# Patient Record
Sex: Female | Born: 1995 | Race: White | Hispanic: No | Marital: Married | Smoking: Former smoker
Health system: Southern US, Community
[De-identification: ages and names within clinical notes are randomized; demographics above are authoritative.]

## PROBLEM LIST (undated history)

## (undated) ENCOUNTER — Ambulatory Visit: Admission: EM

## (undated) DIAGNOSIS — Z9889 Other specified postprocedural states: Secondary | ICD-10-CM

## (undated) DIAGNOSIS — N809 Endometriosis, unspecified: Secondary | ICD-10-CM

## (undated) DIAGNOSIS — R519 Headache, unspecified: Secondary | ICD-10-CM

## (undated) DIAGNOSIS — D649 Anemia, unspecified: Secondary | ICD-10-CM

## (undated) DIAGNOSIS — N3000 Acute cystitis without hematuria: Secondary | ICD-10-CM

## (undated) DIAGNOSIS — F32A Depression, unspecified: Secondary | ICD-10-CM

## (undated) DIAGNOSIS — G90A Postural orthostatic tachycardia syndrome (POTS): Secondary | ICD-10-CM

## (undated) DIAGNOSIS — I499 Cardiac arrhythmia, unspecified: Secondary | ICD-10-CM

## (undated) DIAGNOSIS — K219 Gastro-esophageal reflux disease without esophagitis: Secondary | ICD-10-CM

## (undated) DIAGNOSIS — E559 Vitamin D deficiency, unspecified: Secondary | ICD-10-CM

## (undated) DIAGNOSIS — R51 Headache: Secondary | ICD-10-CM

## (undated) DIAGNOSIS — R112 Nausea with vomiting, unspecified: Secondary | ICD-10-CM

## (undated) DIAGNOSIS — F419 Anxiety disorder, unspecified: Secondary | ICD-10-CM

## (undated) HISTORY — PX: ADENOIDECTOMY: SHX5191

## (undated) HISTORY — PX: TONSILLECTOMY: SUR1361

## (undated) HISTORY — DX: Anxiety disorder, unspecified: F41.9

## (undated) HISTORY — PX: WISDOM TOOTH EXTRACTION: SHX21

## (undated) HISTORY — PX: CHOLECYSTECTOMY: SHX55

## (undated) HISTORY — PX: TONSILLECTOMY AND ADENOIDECTOMY: SHX28

---
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
## 2013-01-23 ENCOUNTER — Emergency Department: Payer: Self-pay | Admitting: Emergency Medicine

## 2013-01-23 LAB — CBC
HGB: 13.7 g/dL (ref 12.0–16.0)
MCHC: 33.7 g/dL (ref 32.0–36.0)
MCV: 85 fL (ref 80–100)
Platelet: 157 10*3/uL (ref 150–440)
RBC: 4.78 10*6/uL (ref 3.80–5.20)
RDW: 13.3 % (ref 11.5–14.5)

## 2013-01-23 LAB — COMPREHENSIVE METABOLIC PANEL
Bilirubin,Total: 0.5 mg/dL (ref 0.2–1.0)
Chloride: 111 mmol/L — ABNORMAL HIGH (ref 97–107)
Co2: 23 mmol/L (ref 16–25)
EGFR (African American): >60
EGFR (Non-African Amer.): >60
Osmolality: 278 (ref 275–301)
Potassium: 3.9 mmol/L (ref 3.3–4.7)
SGOT(AST): 17 U/L (ref 0–26)

## 2013-01-23 LAB — URINALYSIS, COMPLETE
Bacteria: NONE SEEN
Glucose,UR: NEGATIVE mg/dL (ref 0–75)
Ketone: NEGATIVE
Protein: NEGATIVE
RBC,UR: 1 /HPF (ref 0–5)
Specific Gravity: 1.023 (ref 1.003–1.030)
Squamous Epithelial: 6

## 2013-01-23 LAB — GC/CHLAMYDIA PROBE AMP

## 2013-11-08 ENCOUNTER — Emergency Department: Payer: Self-pay | Admitting: Emergency Medicine

## 2013-11-08 LAB — CBC
HCT: 40.4 % (ref 35.0–47.0)
HGB: 12.7 g/dL (ref 12.0–16.0)
MCH: 27.6 pg (ref 26.0–34.0)
MCHC: 31.5 g/dL — ABNORMAL LOW (ref 32.0–36.0)
MCV: 88 fL (ref 80–100)
Platelet: 135 10*3/uL — ABNORMAL LOW (ref 150–440)
RBC: 4.62 10*6/uL (ref 3.80–5.20)
RDW: 14 % (ref 11.5–14.5)
WBC: 6.7 10*3/uL (ref 3.6–11.0)

## 2013-11-08 LAB — URINALYSIS, COMPLETE
Bilirubin,UR: NEGATIVE
Blood: NEGATIVE
GLUCOSE, UR: NEGATIVE mg/dL (ref 0–75)
Ketone: NEGATIVE
Leukocyte Esterase: NEGATIVE
Nitrite: NEGATIVE
PH: 5 (ref 4.5–8.0)
RBC,UR: NONE SEEN /HPF (ref 0–5)
Specific Gravity: 1.028 (ref 1.003–1.030)
Squamous Epithelial: 22

## 2013-11-08 LAB — BASIC METABOLIC PANEL
Anion Gap: 7 (ref 7–16)
BUN: 13 mg/dL (ref 7–18)
CALCIUM: 8.6 mg/dL — AB (ref 9.0–10.7)
CO2: 24 mmol/L (ref 21–32)
CREATININE: 0.77 mg/dL (ref 0.60–1.30)
Chloride: 110 mmol/L — ABNORMAL HIGH (ref 98–107)
EGFR (African American): >60
Glucose: 84 mg/dL (ref 65–99)
OSMOLALITY: 281 (ref 275–301)
Potassium: 4.1 mmol/L (ref 3.5–5.1)
SODIUM: 141 mmol/L (ref 136–145)

## 2014-02-09 ENCOUNTER — Emergency Department: Payer: Self-pay | Admitting: Emergency Medicine

## 2014-02-09 LAB — CBC
HCT: 41.2 % (ref 35.0–47.0)
HGB: 12.9 g/dL (ref 12.0–16.0)
MCH: 28.2 pg (ref 26.0–34.0)
MCHC: 31.4 g/dL — ABNORMAL LOW (ref 32.0–36.0)
MCV: 90 fL (ref 80–100)
Platelet: 149 10*3/uL — ABNORMAL LOW (ref 150–440)
RBC: 4.59 10*6/uL (ref 3.80–5.20)
RDW: 13.6 % (ref 11.5–14.5)
WBC: 13.2 10*3/uL — AB (ref 3.6–11.0)

## 2014-02-10 LAB — HCG, QUANTITATIVE, PREGNANCY: Beta Hcg, Quant.: 63277 m[IU]/mL — ABNORMAL HIGH

## 2014-02-10 LAB — URINALYSIS, COMPLETE
BILIRUBIN, UR: NEGATIVE
Glucose,UR: NEGATIVE mg/dL (ref 0–75)
Ketone: NEGATIVE
Leukocyte Esterase: NEGATIVE
NITRITE: NEGATIVE
Ph: 6 (ref 4.5–8.0)
Protein: NEGATIVE
RBC,UR: 7 /HPF (ref 0–5)
Specific Gravity: 1.016 (ref 1.003–1.030)
Squamous Epithelial: 2
WBC UR: 1 /HPF (ref 0–5)

## 2014-05-02 ENCOUNTER — Ambulatory Visit: Payer: Self-pay | Admitting: Obstetrics & Gynecology

## 2014-05-08 ENCOUNTER — Emergency Department: Payer: Self-pay | Admitting: Emergency Medicine

## 2014-05-08 LAB — CBC WITH DIFFERENTIAL/PLATELET
Basophil #: 0.1 10*3/uL (ref 0.0–0.1)
Basophil %: 0.4 %
EOS ABS: 0.1 10*3/uL (ref 0.0–0.7)
Eosinophil %: 0.7 %
HCT: 37.8 % (ref 35.0–47.0)
HGB: 12 g/dL (ref 12.0–16.0)
Lymphocyte #: 2.1 10*3/uL (ref 1.0–3.6)
Lymphocyte %: 12.1 %
MCH: 28.9 pg (ref 26.0–34.0)
MCHC: 31.9 g/dL — ABNORMAL LOW (ref 32.0–36.0)
MCV: 91 fL (ref 80–100)
MONO ABS: 1.4 x10 3/mm — AB (ref 0.2–0.9)
Monocyte %: 8.4 %
NEUTROS ABS: 13.3 10*3/uL — AB (ref 1.4–6.5)
Neutrophil %: 78.4 %
Platelet: 157 10*3/uL (ref 150–440)
RBC: 4.16 10*6/uL (ref 3.80–5.20)
RDW: 14 % (ref 11.5–14.5)
WBC: 17 10*3/uL — AB (ref 3.6–11.0)

## 2014-05-08 LAB — URINALYSIS, COMPLETE
BILIRUBIN, UR: NEGATIVE
Blood: NEGATIVE
Glucose,UR: NEGATIVE mg/dL (ref 0–75)
KETONE: NEGATIVE
LEUKOCYTE ESTERASE: NEGATIVE
Nitrite: NEGATIVE
Ph: 6 (ref 4.5–8.0)
Protein: NEGATIVE
Specific Gravity: 1.004 (ref 1.003–1.030)
Squamous Epithelial: 8

## 2014-05-08 LAB — COMPREHENSIVE METABOLIC PANEL
ALT: 21 U/L
ANION GAP: 7 (ref 7–16)
AST: 16 U/L (ref 15–37)
Albumin: 3 g/dL — ABNORMAL LOW (ref 3.4–5.0)
Alkaline Phosphatase: 53 U/L
BUN: 8 mg/dL (ref 7–18)
Bilirubin,Total: 0.2 mg/dL (ref 0.2–1.0)
CO2: 23 mmol/L (ref 21–32)
Calcium, Total: 8.4 mg/dL — ABNORMAL LOW (ref 8.5–10.1)
Chloride: 108 mmol/L — ABNORMAL HIGH (ref 98–107)
Creatinine: 0.56 mg/dL — ABNORMAL LOW (ref 0.60–1.30)
EGFR (African American): >60
GLUCOSE: 77 mg/dL (ref 65–99)
Osmolality: 273 (ref 275–301)
Potassium: 3.6 mmol/L (ref 3.5–5.1)
Sodium: 138 mmol/L (ref 136–145)
Total Protein: 6.6 g/dL (ref 6.4–8.2)

## 2014-05-08 LAB — GC/CHLAMYDIA PROBE AMP

## 2014-05-08 LAB — WET PREP, GENITAL

## 2014-05-08 LAB — HCG, QUANTITATIVE, PREGNANCY: Beta Hcg, Quant.: 5992 m[IU]/mL — ABNORMAL HIGH

## 2014-08-21 ENCOUNTER — Observation Stay: Payer: Self-pay | Admitting: Obstetrics & Gynecology

## 2014-09-04 ENCOUNTER — Observation Stay: Payer: Self-pay

## 2014-09-04 LAB — URINALYSIS, COMPLETE
Bilirubin,UR: NEGATIVE
Blood: NEGATIVE
Glucose,UR: NEGATIVE mg/dL (ref 0–75)
KETONE: NEGATIVE
Nitrite: NEGATIVE
PH: 6 (ref 4.5–8.0)
Protein: NEGATIVE
RBC,UR: 3 /HPF (ref 0–5)
SPECIFIC GRAVITY: 1.009 (ref 1.003–1.030)
Squamous Epithelial: 3

## 2014-09-04 LAB — DRUG SCREEN, URINE
Amphetamines, Ur Screen: NEGATIVE
BENZODIAZEPINE, UR SCRN: NEGATIVE
Barbiturates, Ur Screen: NEGATIVE
CANNABINOID 50 NG, UR ~~LOC~~: NEGATIVE
Cocaine Metabolite,Ur ~~LOC~~: NEGATIVE
MDMA (Ecstasy)Ur Screen: NEGATIVE
Methadone, Ur Screen: NEGATIVE
Opiate, Ur Screen: NEGATIVE
Phencyclidine (PCP) Ur S: NEGATIVE
Tricyclic, Ur Screen: NEGATIVE

## 2014-09-25 ENCOUNTER — Observation Stay
Admit: 2014-09-25 | Disposition: A | Discharge status: Home / Self Care | Payer: Self-pay | Attending: Certified Nurse Midwife | Admitting: Certified Nurse Midwife

## 2014-09-25 ENCOUNTER — Inpatient Hospital Stay
Admit: 2014-09-25 | Disposition: A | Discharge status: Home / Self Care | Payer: Self-pay | Attending: Certified Nurse Midwife | Admitting: Certified Nurse Midwife

## 2014-09-25 LAB — DRUG SCREEN, URINE
AMPHETAMINES, UR SCREEN: NEGATIVE
BENZODIAZEPINE, UR SCRN: NEGATIVE
Barbiturates, Ur Screen: NEGATIVE
CANNABINOID 50 NG, UR ~~LOC~~: NEGATIVE
Cocaine Metabolite,Ur ~~LOC~~: NEGATIVE
MDMA (Ecstasy)Ur Screen: NEGATIVE
METHADONE, UR SCREEN: NEGATIVE
Opiate, Ur Screen: POSITIVE
PHENCYCLIDINE (PCP) UR S: NEGATIVE
Tricyclic, Ur Screen: NEGATIVE

## 2014-09-25 LAB — CBC WITH DIFFERENTIAL/PLATELET
Basophil #: 0 10*3/uL (ref 0.0–0.1)
Basophil %: 0.2 %
EOS ABS: 0.1 10*3/uL (ref 0.0–0.7)
Eosinophil %: 0.5 %
HCT: 38.5 % (ref 35.0–47.0)
HGB: 12.2 g/dL (ref 12.0–16.0)
LYMPHS ABS: 1.7 10*3/uL (ref 1.0–3.6)
Lymphocyte %: 9.4 %
MCH: 27.7 pg (ref 26.0–34.0)
MCHC: 31.6 g/dL — AB (ref 32.0–36.0)
MCV: 88 fL (ref 80–100)
Monocyte #: 1.4 x10 3/mm — ABNORMAL HIGH (ref 0.2–0.9)
Monocyte %: 7.9 %
NEUTROS PCT: 82 %
Neutrophil #: 14.4 10*3/uL — ABNORMAL HIGH (ref 1.4–6.5)
PLATELETS: 143 10*3/uL — AB (ref 150–440)
RBC: 4.4 10*6/uL (ref 3.80–5.20)
RDW: 16.3 % — ABNORMAL HIGH (ref 11.5–14.5)
WBC: 17.6 10*3/uL — ABNORMAL HIGH (ref 3.6–11.0)

## 2014-09-27 LAB — HEMATOCRIT: HCT: 32.2 % — ABNORMAL LOW (ref 35.0–47.0)

## 2014-09-27 LAB — GC/CHLAMYDIA PROBE AMP

## 2014-10-17 NOTE — H&P (Signed)
L&D Evaluation:  History:  HPI Pt is a 19 yo G1 at 38.[redacted] weeks GA with an EDC of 10/05/14 who presents to L&D with reports of contractions that started around 4am. She also reports that while she was taking a bath she felt like some fluid gushed out. Her prenatal course has been complicated by a UTI x2 with the last one being 4/14, bleeding in early pregnancy with Rhogam given, and MJ use with the last screen on 3/22 being negative. 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 She is B-, VI, RI, GBS negative, and UTD on her Tdap.   Presents with contractions   Patient's Medical History UTI   Patient's Surgical History T&A, wisdom teeth extraction   Allergies PCN   Social History tobacco  drugs  MJ use   Family History Non-Contributory   ROS:  ROS All systems were reviewed.  HEENT, CNS, GI, GU, Respiratory, CV, Renal and Musculoskeletal systems were found to be normal.   Exam:  Vital Signs stable   General appears uncomfortable   Mental Status clear   Abdomen gravid, tender with contractions   Pelvic 3.5/85-90/0   Mebranes Intact, negative nitrazine   FHT normal rate with no decels   Ucx irregular   Skin dry, no lesions, no rashes   Lymph no lymphadenopathy   Impression:  Impression reactive NST, IUP at 38.4, r/o labor   Plan:  Plan monitor contractions and for cervical change, ambulate, will re-assess cervix after 1 hour of walking.   Follow Up Appointment need to schedule   Electronic Signatures: Jannet MantisSubudhi, Abeeha Twist (CNM)  (Signed 18-Apr-16 09:14)  Authored: L&D Evaluation   Last Updated: 18-Apr-16 09:14 by Jannet MantisSubudhi, Garik Diamant (CNM)

## 2014-10-17 NOTE — H&P (Signed)
L&D Evaluation:  History:  HPI Pt is a 19 yo G1 at 38.[redacted] weeks GA with an EDC of 10/05/14 who presents to L&D with reports of contractions that started around 4am. She was seen earlier today on L&D and made no cervical change so she was discharged to be able to labor at home in the early stages. Her prenatal course has been complicated by a UTI x2 with the last one being 4/14, bleeding in early pregnancy with Rhogam given, and MJ use with the last screen on 3/22 being negative. 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 She is B-, VI, RI, GBS negative, and UTD on her Tdap.   Presents with contractions   Patient's Medical History UTI   Patient's Surgical History T&A, wisdom teeth extraction   Allergies PCN   Social History tobacco  drugs  MJ use   Family History Non-Contributory   ROS:  ROS All systems were reviewed.  HEENT, CNS, GI, GU, Respiratory, CV, Renal and Musculoskeletal systems were found to be normal.   Exam:  Vital Signs stable   General appears uncomfortable, breathing through contrations   Mental Status clear   Abdomen gravid, tender with contractions   Pelvic 4/90/+1   Mebranes Intact   FHT normal rate with no decels   Ucx irregular   Skin dry, no lesions, no rashes   Lymph no lymphadenopathy   Impression:  Impression reactive NST, IUP at 38.4, early labor   Plan:  Plan monitor contractions and for cervical change, will give morphine and phenergan IM for therapeutic rest.  Dispo pending cervical exam after therapeutic rest.   Follow Up Appointment need to schedule   Electronic Signatures: Jannet MantisSubudhi, Naasia Weilbacher (CNM)  (Signed 18-Apr-16 15:08)  Authored: L&D Evaluation   Last Updated: 18-Apr-16 15:08 by Jannet MantisSubudhi, Marleena Shubert (CNM)

## 2014-10-17 NOTE — H&P (Signed)
L&D Evaluation:  History Expanded:  HPI 19 yo G1 with EDD of 10/05/14 per 6 wk US, presents at 8546w4d with c/o painful, regular contractions. No VB, no LOF, +FM[facility], MJ use with negative UDS on 3/22. Labs: B neg/RI/VI.   Blood Type (Maternal) B negative   Group B Strep Results Maternal (Result >5wks must be treated as unknown) unknown/result > 5 weeks ago   Maternal HIV Negative   Maternal Syphilis Ab Nonreactive   Maternal Varicella Immune   Rubella Results (Maternal) immune   Presents with contractions   Patient's Medical History No Chronic Illness   Patient's Surgical History T&A, wisdom teeth   Allergies PCN, amoxicillin   Social History h/o marijuana and tobacco   Exam:  Vital Signs stable   General no apparent distress   Mental Status clear   Abdomen gravid, non-tender   Pelvic no external lesions, 1/80/-1, posterior   Mebranes Intact   FHT normal rate with no decels, category 1 tracing, baseline 130, mod variability, + accels, no decels   Ucx irregular   Impression:  Impression IUP at 35wks, discomforts of pregnancy, no evidence of labor   Plan:  Plan discharge   Comments UA pending, then d/c home   Electronic Signatures: Vella KohlerBrothers, Tymothy Cass K (CNM)  (Signed 28-Mar-16 16:39)  Authored: L&D Evaluation   Last Updated: 28-Mar-16 16:39 by Vella KohlerBrothers, Kadarrius Yanke K (CNM)

## 2014-12-08 ENCOUNTER — Other Ambulatory Visit: Payer: Self-pay

## 2014-12-08 DIAGNOSIS — R197 Diarrhea, unspecified: Secondary | ICD-10-CM | POA: Diagnosis not present

## 2014-12-08 DIAGNOSIS — Z880 Allergy status to penicillin: Secondary | ICD-10-CM | POA: Insufficient documentation

## 2014-12-08 DIAGNOSIS — R42 Dizziness and giddiness: Secondary | ICD-10-CM | POA: Diagnosis present

## 2014-12-08 DIAGNOSIS — I951 Orthostatic hypotension: Secondary | ICD-10-CM | POA: Diagnosis not present

## 2014-12-08 DIAGNOSIS — E860 Dehydration: Secondary | ICD-10-CM | POA: Insufficient documentation

## 2014-12-08 LAB — CBC
HCT: 40.1 % (ref 35.0–47.0)
HEMOGLOBIN: 12.9 g/dL (ref 12.0–16.0)
MCH: 28 pg (ref 26.0–34.0)
MCHC: 32.2 g/dL (ref 32.0–36.0)
MCV: 86.8 fL (ref 80.0–100.0)
PLATELETS: 178 10*3/uL (ref 150–440)
RBC: 4.62 MIL/uL (ref 3.80–5.20)
RDW: 13.7 % (ref 11.5–14.5)
WBC: 8.6 10*3/uL (ref 3.6–11.0)

## 2014-12-08 NOTE — ED Notes (Signed)
Pt states has felt dizzy when standing up, has central chest pain. Pt states "i just feel out of it". Pt states has also had "blue lips".

## 2014-12-09 ENCOUNTER — Emergency Department
Admission: EM | Admit: 2014-12-09 | Discharge: 2014-12-09 | Disposition: A | Discharge status: Home / Self Care | Payer: Medicaid Other | Attending: Emergency Medicine | Admitting: Emergency Medicine

## 2014-12-09 DIAGNOSIS — I951 Orthostatic hypotension: Secondary | ICD-10-CM

## 2014-12-09 DIAGNOSIS — E860 Dehydration: Secondary | ICD-10-CM

## 2014-12-09 LAB — BASIC METABOLIC PANEL
ANION GAP: 7 (ref 5–15)
BUN: 13 mg/dL (ref 6–20)
CALCIUM: 8.8 mg/dL — AB (ref 8.9–10.3)
CO2: 23 mmol/L (ref 22–32)
CREATININE: 0.84 mg/dL (ref 0.44–1.00)
Chloride: 109 mmol/L (ref 101–111)
Glucose, Bld: 82 mg/dL (ref 65–99)
POTASSIUM: 3.9 mmol/L (ref 3.5–5.1)
Sodium: 139 mmol/L (ref 135–145)

## 2014-12-09 LAB — TROPONIN I: Troponin I: <0.03 ng/mL (ref <0.031)

## 2014-12-09 NOTE — ED Provider Notes (Signed)
[facility] Emergency Department Provider Note  ____________________________________________  Time seen: 2:30 AM  I have reviewed the triage vital signs and the nursing notes.   HISTORY  Chief Complaint Dizziness and Chest Pain      HPI [name] is a 19 y.o. female presents with diarrhea 2 days and dizziness with standing times today. Patient denies any abdominal pain at present or chest pain. Last diarrheal episode 1 PM yesterday     Past medical history 2 months postpartum  There are no active problems to display for this patient.      No current outpatient prescriptions on file.  Allergies Amoxicillin  No family history on file.  Social History History  Substance Use Topics  . Smoking status: Not on file  . Smokeless tobacco: Not on file  . Alcohol Use: Not on file    Review of Systems  Constitutional: Negative for fever. Eyes: Negative for visual changes. ENT: Negative for sore throat. Cardiovascular: Negative for chest pain. Respiratory: Negative for shortness of breath. Gastrointestinal: Negative for abdominal pain, vomiting.  positive for diarrhea. Genitourinary: Negative for dysuria. Musculoskeletal: Negative for back pain. Skin: Negative for rash. Neurological: Negative for headaches, focal weakness or numbness.   10-point ROS otherwise negative.  ____________________________________________   PHYSICAL EXAM:  VITAL SIGNS: ED Triage Vitals  Enc Vitals Group     BP 12/08/14 2303 124/86 mmHg     Pulse Rate 12/08/14 2303 72     Resp 12/08/14 2303 16     Temp 12/08/14 2303 98.6 F (37 C)     Temp Source 12/08/14 2303 Oral     SpO2 12/08/14 2303 100 %     Weight 12/08/14 2303 125 lb (56.7 kg)     Height 12/08/14 2303  (1.549 m)     Head Cir --      Peak Flow --      Pain Score 12/08/14 2304 8     Pain Loc --      Pain Edu? --      Excl. in GC? --      Constitutional: Alert and oriented.  Well appearing and in no distress. Eyes: Conjunctivae are normal. PERRL. Normal extraocular movements. ENT   Head: Normocephalic and atraumatic.   Nose: No congestion/rhinnorhea.   Mouth/Throat: Mucous membranes are moist.   Neck: No stridor. Hematological/Lymphatic/Immunilogical: No cervical lymphadenopathy. Cardiovascular: Normal rate, regular rhythm. Normal and symmetric distal pulses are present in all extremities. No murmurs, rubs, or gallops. Respiratory: Normal respiratory effort without tachypnea nor retractions. Breath sounds are clear and equal bilaterally. No wheezes/rales/rhonchi. Gastrointestinal: Soft and nontender. No distention. There is no CVA tenderness. Genitourinary: deferred Musculoskeletal: Nontender with normal range of motion in all extremities. No joint effusions.  No lower extremity tenderness nor edema. Neurologic:  Normal speech and language. No gross focal neurologic deficits are appreciated. Speech is normal.  Skin:  Skin is warm, dry and intact. No rash noted. Psychiatric: Mood and affect are normal. Speech and behavior are normal. Patient exhibits appropriate insight and judgment.  ____________________________________________    LABS (pertinent positives/negatives)  Labs Reviewed  BASIC METABOLIC PANEL - Abnormal; Notable for the following:    Calcium 8.8 (*)    All other components within normal limits  CBC  TROPONIN I  POC URINE PREG, ED     ____________________________________________   EKG   Date: 12/09/2014  Rate: 52  Rhythm: Sinus bradycardia  QRS Axis: normal  Intervals: normal  ST/T  Wave abnormalities: normal  Conduction Disutrbances: none  Narrative Interpretation: unremarkable         INITIAL IMPRESSION / ASSESSMENT AND PLAN / ED COURSE  Pertinent labs & imaging results that were available during my care of the patient were reviewed by me and considered in my medical decision making (see chart for  details).  History of physical exam consistent with possible orthostatic hypotension secondary to diarrheal illness. Given patient's current asymptomatic presentation patient advised to orally hydrate today approximately 2 L water or Gatorade. Due to absence of abdominal pain and normal lab data no imaging studies performed  ____________________________________________   FINAL CLINICAL IMPRESSION(S) / ED DIAGNOSES  Final diagnoses:  Orthostatic hypotension  Dehydration      Darci Currentandolph N Brown, MD 12/12/14 [phone]

## 2014-12-09 NOTE — ED Notes (Signed)
Lt. Arm Lying down-BP 106/63  HR 48 Sitting Up          116/74  HR 55 Standing            119/83  HR 59

## 2014-12-09 NOTE — ED Notes (Signed)
Pt. Going home with husband.  Pt. Knows to increase fluid intake by drinking water or gatoraid.

## 2014-12-09 NOTE — ED Notes (Signed)
Pt. States diarrhea for the past 2 days.  Pt. States feeling dizzy upon standing for two days.  Pt. States having newborn 2 months ago.  Pt. States "I think my chest was hurting because my breasts have gotten bigger from having baby."

## 2014-12-09 NOTE — Discharge Instructions (Signed)
Dehydration, Adult °Dehydration is when you lose more fluids from the body than you take in. Vital organs like the kidneys, brain, and heart cannot function without a proper amount of fluids and salt. Any loss of fluids from the body can cause dehydration.  °CAUSES  °· Vomiting. °· Diarrhea. °· Excessive sweating. °· Excessive urine output. °· Fever. °SYMPTOMS  °Mild dehydration °· Thirst. °· Dry lips. °· Slightly dry mouth. °Moderate dehydration °· Very dry mouth. °· Sunken eyes. °· Skin does not bounce back quickly when lightly pinched and released. °· Dark urine and decreased urine production. °· Decreased tear production. °· Headache. °Severe dehydration °· Very dry mouth. °· Extreme thirst. °· Rapid, weak pulse (more than 100 beats per minute at rest). °· Cold hands and feet. °· Not able to sweat in spite of heat and temperature. °· Rapid breathing. °· Blue lips. °· Confusion and lethargy. °· Difficulty being awakened. °· Minimal urine production. °· No tears. °DIAGNOSIS  °Your caregiver will diagnose dehydration based on your symptoms and your exam. Blood and urine tests will help confirm the diagnosis. The diagnostic evaluation should also identify the cause of dehydration. °TREATMENT  °Treatment of mild or moderate dehydration can often be done at home by increasing the amount of fluids that you drink. It is best to drink small amounts of fluid more often. Drinking too much at one time can make vomiting worse. Refer to the home care instructions below. °Severe dehydration needs to be treated at the hospital where you will probably be given intravenous (IV) fluids that contain water and electrolytes. °HOME CARE INSTRUCTIONS  °· Ask your caregiver about specific rehydration instructions. °· Drink enough fluids to keep your urine clear or pale yellow. °· Drink small amounts frequently if you have nausea and vomiting. °· Eat as you normally do. °· Avoid: °¨ Foods or drinks high in sugar. °¨ Carbonated  drinks. °¨ Juice. °¨ Extremely hot or cold fluids. °¨ Drinks with caffeine. °¨ Fatty, greasy foods. °¨ Alcohol. °¨ Tobacco. °¨ Overeating. °¨ Gelatin desserts. °· Wash your hands well to avoid spreading bacteria and viruses. °· Only take over-the-counter or prescription medicines for pain, discomfort, or fever as directed by your caregiver. °· Ask your caregiver if you should continue all prescribed and over-the-counter medicines. °· Keep all follow-up appointments with your caregiver. °SEEK MEDICAL CARE IF: °· You have abdominal pain and it increases or stays in one area (localizes). °· You have a rash, stiff neck, or severe headache. °· You are irritable, sleepy, or difficult to awaken. °· You are weak, dizzy, or extremely thirsty. °SEEK IMMEDIATE MEDICAL CARE IF:  °· You are unable to keep fluids down or you get worse despite treatment. °· You have frequent episodes of vomiting or diarrhea. °· You have blood or green matter (bile) in your vomit. °· You have blood in your stool or your stool looks black and tarry. °· You have not urinated in 6 to 8 hours, or you have only urinated a small amount of very dark urine. °· You have a fever. °· You faint. °MAKE SURE YOU:  °· Understand these instructions. °· Will watch your condition. °· Will get help right away if you are not doing well or get worse. °Document Released: 05/26/2005 Document Revised: 08/18/2011 Document Reviewed: 01/13/2011 °ExitCare® Patient Information ©2015 [employer]. This information is not intended to replace advice given to you by your health care provider. Make sure you discuss any questions you have with your health care   provider. ° °Orthostatic Hypotension °Orthostatic hypotension is a sudden drop in blood pressure. It happens when you quickly stand up from a seated or lying position. You Stoudt feel dizzy or light-headed. This can last for just a few seconds or for up to a few minutes. It is usually not a serious problem. However, if this  happens frequently or gets worse, it can be a sign of something more serious. °CAUSES  °Different things can cause orthostatic hypotension, including:  °· Loss of body fluids (dehydration). °· Medicines that lower blood pressure. °· Sudden changes in posture, such as standing up quickly after you have been sitting or lying down. °· Taking too much of your medicine. °SIGNS AND SYMPTOMS  °· Light-headedness or dizziness.   °· Fainting or near-fainting.   °· A fast heart rate.   °· Weakness.   °· Feeling tired (fatigue).   °DIAGNOSIS  °Your health care provider Schnepp do several things to help diagnose your condition and identify the cause. These See include:  °· Taking a medical history and doing a physical exam. °· Checking your blood pressure. Your health care provider will check your blood pressure when you are: °¨ Lying down. °¨ Sitting. °¨ Standing. °· Using tilt table testing. In this test, you lie down on a table that moves from a lying position to a standing position. You will be strapped onto the table. This test monitors your blood pressure and heart rate when you are in different positions. °TREATMENT  °Treatment will vary depending on the cause. Possible treatments include:  °· Changing the dosage of your medicines.  °· Wearing compression stockings on your lower legs. °· Standing up slowly after sitting or lying down. °· Eating more salt. °· Eating frequent, small meals. °· In some cases, getting IV fluids. °· Taking medicine to enhance fluid retention. °HOME CARE INSTRUCTIONS °· Only take over-the-counter or prescription medicines as directed by your health care provider. °¨ Follow your health care provider's instructions for changing the dosage of your current medicines. °¨ Do not stop or adjust your medicine on your own. °· Stand up slowly after sitting or lying down. This allows your body to adjust to the different position. °· Wear compression stockings as directed. °· Eat extra salt as directed. °¨ Do  not add extra salt to your diet unless directed to by your health care provider. °· Eat frequent, small meals. °· Avoid standing suddenly after eating. °· Avoid hot showers or excessive heat as directed by your health care provider. °· Keep all follow-up appointments. °SEEK MEDICAL CARE IF: °· You continue to feel dizzy or light-headed after standing. °· You feel groggy or confused. °· You feel cold, clammy, or sick to your stomach (nauseous). °· You have blurred vision. °· You feel short of breath. °SEEK IMMEDIATE MEDICAL CARE IF:  °· You faint after standing. °· You have chest pain.  °· You have difficulty breathing.   °· You lose feeling or movement in your arms or legs.   °· You have slurred speech or difficulty talking, or you are unable to talk.   °MAKE SURE YOU:  °· Understand these instructions. °· Will watch your condition. °· Will get help right away if you are not doing well or get worse. °Document Released: 05/16/2002 Document Revised: 05/31/2013 Document Reviewed: 03/18/2013 °ExitCare® Patient Information ©2015 [employer]. This information is not intended to replace advice given to you by your health care provider. Make sure you discuss any questions you have with your health care provider. ° °

## 2014-12-10 ENCOUNTER — Encounter: Payer: Self-pay | Admitting: Emergency Medicine

## 2014-12-10 DIAGNOSIS — R51 Headache: Secondary | ICD-10-CM | POA: Diagnosis not present

## 2014-12-10 DIAGNOSIS — R079 Chest pain, unspecified: Secondary | ICD-10-CM | POA: Insufficient documentation

## 2014-12-10 DIAGNOSIS — Z720 Tobacco use: Secondary | ICD-10-CM | POA: Insufficient documentation

## 2014-12-10 DIAGNOSIS — R531 Weakness: Secondary | ICD-10-CM | POA: Insufficient documentation

## 2014-12-10 DIAGNOSIS — R42 Dizziness and giddiness: Secondary | ICD-10-CM | POA: Insufficient documentation

## 2014-12-10 DIAGNOSIS — Z880 Allergy status to penicillin: Secondary | ICD-10-CM | POA: Insufficient documentation

## 2014-12-10 DIAGNOSIS — R55 Syncope and collapse: Secondary | ICD-10-CM | POA: Diagnosis present

## 2014-12-10 DIAGNOSIS — N390 Urinary tract infection, site not specified: Secondary | ICD-10-CM | POA: Diagnosis not present

## 2014-12-10 LAB — URINALYSIS COMPLETE WITH MICROSCOPIC (ARMC ONLY)
Bilirubin Urine: NEGATIVE
Glucose, UA: NEGATIVE mg/dL
Ketones, ur: NEGATIVE mg/dL
Nitrite: NEGATIVE
Protein, ur: NEGATIVE mg/dL
Specific Gravity, Urine: 1.008 (ref 1.005–1.030)
pH: 7 (ref 5.0–8.0)

## 2014-12-10 LAB — CBC WITH DIFFERENTIAL/PLATELET
Basophils Absolute: 0 10*3/uL (ref 0–0.1)
Basophils Relative: 1 %
EOS PCT: 2 %
Eosinophils Absolute: 0.1 10*3/uL (ref 0–0.7)
HEMATOCRIT: 41.6 % (ref 35.0–47.0)
Hemoglobin: 13.1 g/dL (ref 12.0–16.0)
LYMPHS ABS: 2.2 10*3/uL (ref 1.0–3.6)
Lymphocytes Relative: 24 %
MCH: 27.4 pg (ref 26.0–34.0)
MCHC: 31.5 g/dL — ABNORMAL LOW (ref 32.0–36.0)
MCV: 86.8 fL (ref 80.0–100.0)
Monocytes Absolute: 1.2 10*3/uL — ABNORMAL HIGH (ref 0.2–0.9)
Monocytes Relative: 13 %
Neutro Abs: 5.7 10*3/uL (ref 1.4–6.5)
Neutrophils Relative %: 62 %
PLATELETS: 187 10*3/uL (ref 150–440)
RBC: 4.79 MIL/uL (ref 3.80–5.20)
RDW: 13.6 % (ref 11.5–14.5)
WBC: 9.2 10*3/uL (ref 3.6–11.0)

## 2014-12-10 LAB — BASIC METABOLIC PANEL
ANION GAP: 9 (ref 5–15)
BUN: 16 mg/dL (ref 6–20)
CALCIUM: 9.4 mg/dL (ref 8.9–10.3)
CHLORIDE: 106 mmol/L (ref 101–111)
CO2: 26 mmol/L (ref 22–32)
CREATININE: 0.87 mg/dL (ref 0.44–1.00)
Glucose, Bld: 65 mg/dL (ref 65–99)
Potassium: 4.1 mmol/L (ref 3.5–5.1)
SODIUM: 141 mmol/L (ref 135–145)

## 2014-12-10 NOTE — ED Notes (Signed)
Pt says over the past year she's had multiple episodes of near syncope; the very first episode about 1 year ago she did pass out; has not since, but having near syncope; pt says the episodes seem more frequent in the last 4 days; fiance says pt had 2 episodes in the car on the way to the ED; pt presents awake and alert; says when she tries to stand up she feels weak, shaking and has palpitations; vision blurs; pt with history of panic attacks; fiance says symptoms are exacerbated around crowds

## 2014-12-11 ENCOUNTER — Other Ambulatory Visit: Payer: Self-pay

## 2014-12-11 ENCOUNTER — Emergency Department: Payer: Medicaid Other

## 2014-12-11 ENCOUNTER — Emergency Department
Admission: EM | Admit: 2014-12-11 | Discharge: 2014-12-11 | Disposition: A | Discharge status: Home / Self Care | Payer: Medicaid Other | Attending: Emergency Medicine | Admitting: Emergency Medicine

## 2014-12-11 DIAGNOSIS — N390 Urinary tract infection, site not specified: Secondary | ICD-10-CM

## 2014-12-11 DIAGNOSIS — I951 Orthostatic hypotension: Secondary | ICD-10-CM

## 2014-12-11 DIAGNOSIS — R531 Weakness: Secondary | ICD-10-CM

## 2014-12-11 LAB — TROPONIN I: Troponin I: <0.03 ng/mL (ref <0.031)

## 2014-12-11 LAB — FIBRIN DERIVATIVES D-DIMER (ARMC ONLY): FIBRIN DERIVATIVES D-DIMER (ARMC): 220 (ref 0–499)

## 2014-12-11 MED ORDER — SULFAMETHOXAZOLE-TRIMETHOPRIM 800-160 MG PO TABS
1.0000 | ORAL_TABLET | Freq: Once | ORAL | Status: AC
  Administered 2014-12-11: 1 via ORAL

## 2014-12-11 MED ORDER — SULFAMETHOXAZOLE-TRIMETHOPRIM 800-160 MG PO TABS
ORAL_TABLET | ORAL | Status: AC
  Administered 2014-12-11: 1 via ORAL
  Filled 2014-12-11: qty 1

## 2014-12-11 MED ORDER — KETOROLAC TROMETHAMINE 30 MG/ML IJ SOLN
INTRAMUSCULAR | Status: AC
  Administered 2014-12-11: 30 mg via INTRAVENOUS
  Filled 2014-12-11: qty 1

## 2014-12-11 MED ORDER — ONDANSETRON HCL 4 MG/2ML IJ SOLN
INTRAMUSCULAR | Status: AC
  Administered 2014-12-11: 4 mg via INTRAVENOUS
  Filled 2014-12-11: qty 2

## 2014-12-11 MED ORDER — SODIUM CHLORIDE 0.9 % IV BOLUS (SEPSIS)
1000.0000 mL | Freq: Once | INTRAVENOUS | Status: AC
  Administered 2014-12-11: 1000 mL via INTRAVENOUS

## 2014-12-11 MED ORDER — KETOROLAC TROMETHAMINE 30 MG/ML IJ SOLN
30.0000 mg | Freq: Once | INTRAMUSCULAR | Status: AC
  Administered 2014-12-11: 30 mg via INTRAVENOUS

## 2014-12-11 MED ORDER — ONDANSETRON HCL 4 MG/2ML IJ SOLN
4.0000 mg | Freq: Once | INTRAMUSCULAR | Status: AC
  Administered 2014-12-11: 4 mg via INTRAVENOUS

## 2014-12-11 MED ORDER — ONDANSETRON HCL 4 MG PO TABS: 4.0000 mg | ORAL_TABLET | Freq: Three times a day (TID) | ORAL | Status: DC | PRN

## 2014-12-11 MED ORDER — SULFAMETHOXAZOLE-TRIMETHOPRIM 800-160 MG PO TABS: 1.0000 | ORAL_TABLET | Freq: Two times a day (BID) | ORAL | Status: DC

## 2014-12-11 NOTE — ED Notes (Signed)
Patient states she has periods of possible anxiety where she is very dizzy, pale and weak. States she also has some stomach pain. Denies vomiting or diarrhea.

## 2014-12-11 NOTE — Discharge Instructions (Signed)
1. Start antibiotics as prescribed (Septra DS twice daily 7 days). 2. Take nausea medicine as needed (Zofran #15). 3. Drink plenty of fluids daily. 4. Return to the ER for worsening symptoms, persistent vomiting, difficulty breathing or other concerns.  Urinary Tract Infection Urinary tract infections (UTIs) can develop anywhere along your urinary tract. Your urinary tract is your body's drainage system for removing wastes and extra water. Your urinary tract includes two kidneys, two ureters, a bladder, and a urethra. Your kidneys are a pair of bean-shaped organs. Each kidney is about the size of your fist. They are located below your ribs, one on each side of your spine. CAUSES Infections are caused by microbes, which are microscopic organisms, including fungi, viruses, and bacteria. These organisms are so small that they can only be seen through a microscope. Bacteria are the microbes that most commonly cause UTIs. SYMPTOMS  Symptoms of UTIs [place] vary by age and gender of the patient and by the location of the infection. Symptoms in young women typically include a frequent and intense urge to urinate and a painful, burning feeling in the bladder or urethra during urination. Older women and men are more likely to be tired, shaky, and weak and have muscle aches and abdominal pain. A fever Go mean the infection is in your kidneys. Other symptoms of a kidney infection include pain in your back or sides below the ribs, nausea, and vomiting. DIAGNOSIS To diagnose a UTI, your caregiver will ask you about your symptoms. Your caregiver also will ask to provide a urine sample. The urine sample will be tested for bacteria and white blood cells. White blood cells are made by your body to help fight infection. TREATMENT  Typically, UTIs can be treated with medication. Because most UTIs are caused by a bacterial infection, they usually can be treated with the use of antibiotics. The choice of antibiotic and  length of treatment depend on your symptoms and the type of bacteria causing your infection. HOME CARE INSTRUCTIONS  If you were prescribed antibiotics, take them exactly as your caregiver instructs you. Finish the medication even if you feel better after you have only taken some of the medication.  Drink enough water and fluids to keep your urine clear or pale yellow.  Avoid caffeine, tea, and carbonated beverages. They tend to irritate your bladder.  Empty your bladder often. Avoid holding urine for long periods of time.  Empty your bladder before and after sexual intercourse.  After a bowel movement, women should cleanse from front to back. Use each tissue only once. SEEK MEDICAL CARE IF:   You have back pain.  You develop a fever.  Your symptoms do not begin to resolve within 3 days. SEEK IMMEDIATE MEDICAL CARE IF:   You have severe back pain or lower abdominal pain.  You develop chills.  You have nausea or vomiting.  You have continued burning or discomfort with urination. MAKE SURE YOU:   Understand these instructions.  Will watch your condition.  Will get help right away if you are not doing well or get worse. Document Released: 03/05/2005 Document Revised: 11/25/2011 Document Reviewed: 07/04/2011 [facility] Patient Information 2015 [place]Care, [place]. This information is not intended to replace advice given to you by your health care provider. Make sure you discuss any questions you have with your health care provider.  Weakness Weakness is a lack of strength. It Krise be felt all over the body (generalized) or in one specific part of the body (focal).  Some causes of weakness can be serious. You Brizuela need further medical evaluation, especially if you are elderly or you have a history of immunosuppression (such as chemotherapy or HIV), kidney disease, heart disease, or diabetes. CAUSES  Weakness can be caused by many different things, including:  Infection.  Physical  exhaustion.  Internal bleeding or other blood loss that results in a lack of red blood cells (anemia).  Dehydration. This cause is more common in elderly people.  Side effects or electrolyte abnormalities from medicines, such as pain medicines or sedatives.  Emotional distress, anxiety, or depression.  Circulation problems, especially severe peripheral arterial disease.  Heart disease, such as rapid atrial fibrillation, bradycardia, or heart failure.  Nervous system disorders, such as Guillain-Barr syndrome, multiple sclerosis, or stroke. DIAGNOSIS  To find the cause of your weakness, your caregiver will take your history and perform a physical exam. Lab tests or X-rays Lapiana also be ordered, if needed. TREATMENT  Treatment of weakness depends on the cause of your symptoms and can vary greatly. HOME CARE INSTRUCTIONS   Rest as needed.  Eat a well-balanced diet.  Try to get some exercise every day.  Only take over-the-counter or prescription medicines as directed by your caregiver. SEEK MEDICAL CARE IF:   Your weakness seems to be getting worse or spreads to other parts of your body.  You develop new aches or pains. SEEK IMMEDIATE MEDICAL CARE IF:   You cannot perform your normal daily activities, such as getting dressed and feeding yourself.  You cannot walk up and down stairs, or you feel exhausted when you do so.  You have shortness of breath or chest pain.  You have difficulty moving parts of your body.  You have weakness in only one area of the body or on only one side of the body.  You have a fever.  You have trouble speaking or swallowing.  You cannot control your bladder or bowel movements.  You have black or bloody vomit or stools. MAKE SURE YOU:  Understand these instructions.  Will watch your condition.  Will get help right away if you are not doing well or get worse. Document Released: 05/26/2005 Document Revised: 11/25/2011 Document Reviewed:  07/25/2011 [facility] Patient Information 2015 [place], [place]. This information is not intended to replace advice given to you by your health care provider. Make sure you discuss any questions you have with your health care provider.

## 2014-12-11 NOTE — ED Provider Notes (Signed)
[facility] Emergency Department Provider Note  ____________________________________________  Time seen: Approximately 12:52 AM  I have reviewed the triage vital signs and the nursing notes.   HISTORY  Chief Complaint Weakness; Near Syncope; Arm Pain; and Abdominal Pain    HPI [name] is a 19 y.o. female who returns to the emergency department for a chief complain of weakness and lightheadedness. Patient was seen in the ED 2 days ago for same; thought to have mild orthostatic hypotension and encouraged to PO hydrate. Patient states since her last visit her diarrhea has improved. She does have periods of "possible anxiety" where she gets dizzy, pale and weak especially around people. During those episodes she does experience chest heaviness and mild shortness of breath. Patient is 2 months postpartum, not breast-feeding. Complains of daily migraine headaches associated with nausea. These headaches are typical of her migraines and are not new since giving birth. Denies fever, chills, vomiting, numbness, tingling.   Past medical history Migraines   There are no active problems to display for this patient.   Past Surgical History  Procedure Laterality Date  . Tonsillectomy    . Adenoidectomy      No current outpatient prescriptions on file.  Allergies Penicillins and Amoxicillin  History reviewed. No pertinent family history.  Social History History  Substance Use Topics  . Smoking status: Current Every Day Smoker  . Smokeless tobacco: Never Used  . Alcohol Use: Yes     Comment: seldom    Review of Systems Constitutional: No fever/chills Eyes: No visual changes. ENT: No sore throat. Cardiovascular: Positive for chest pain. Respiratory: Denies shortness of breath. Gastrointestinal: No abdominal pain.  Positive for nausea, no vomiting.  Recently positive for diarrhea.  No constipation. Genitourinary: Negative for  dysuria. Musculoskeletal: Negative for back pain. Skin: Negative for rash. Neurological: Positive for headaches. Negative for focal weakness or numbness.  10-point ROS otherwise negative.  ____________________________________________   PHYSICAL EXAM:  VITAL SIGNS: ED Triage Vitals  Enc Vitals Group     BP 12/10/14 2210 115/76 mmHg     Pulse Rate 12/10/14 2210 67     Resp --      Temp 12/10/14 2210 98 F (36.7 C)     Temp Source 12/10/14 2210 Oral     SpO2 12/10/14 2210 100 %     Weight 12/10/14 2210 125 lb (56.7 kg)     Height 12/10/14 2210  (1.549 m)     Head Cir --      Peak Flow --      Pain Score 12/10/14 2217 8     Pain Loc --      Pain Edu? --      Excl. in GC? --     Constitutional: Alert and oriented. Well appearing and in no acute distress. Eyes: Conjunctivae are normal. PERRL. EOMI. Head: Atraumatic. Nose: No congestion/rhinnorhea. Mouth/Throat: Mucous membranes are mildly dry.  Oropharynx non-erythematous. Neck: No stridor.   Cardiovascular: Normal rate, regular rhythm. Grossly normal heart sounds.  Good peripheral circulation. Respiratory: Normal respiratory effort.  No retractions. Lungs CTAB. Gastrointestinal: Soft and nontender. No distention. No abdominal bruits. No CVA tenderness. Musculoskeletal: No lower extremity tenderness nor edema.  No joint effusions. Neurologic:  Normal speech and language. No gross focal neurologic deficits are appreciated. Speech is normal.  Skin:  Skin is warm, dry and intact. No rash noted. Psychiatric: Mood and affect are normal. Speech and behavior are normal.  ____________________________________________   LABS (  all labs ordered are listed, but only abnormal results are displayed)  Labs Reviewed  CBC WITH DIFFERENTIAL/PLATELET - Abnormal; Notable for the following:    MCHC 31.5 (*)    Monocytes Absolute 1.2 (*)    All other components within normal limits  URINALYSIS COMPLETEWITH MICROSCOPIC ([facility] ONLY) -  Abnormal; Notable for the following:    Color, Urine STRAW (*)    APPearance HAZY (*)    Hgb urine dipstick 1+ (*)    Leukocytes, UA 3+ (*)    Bacteria, UA RARE (*)    Squamous Epithelial / LPF 6-30 (*)    All other components within normal limits  BASIC METABOLIC PANEL  TROPONIN I  FIBRIN DERIVATIVES D-DIMER ([facility] ONLY)   ____________________________________________  EKG  ED ECG REPORT I, Teion Ballin J, the attending physician, personally viewed and interpreted this ECG.   Date: 12/11/2014  EKG Time: 0124  Rate: 54  Rhythm: sinus bradycardia  Axis: Normal  Intervals:none  ST&T Change: Nonspecific  ____________________________________________  RADIOLOGY  Chest x-ray (viewed by me, interpreted by Dr. Forde Radonchanging): No acute cardiopulmonary process seen. ____________________________________________   PROCEDURES  Procedure(s) performed: None  Critical Care performed: No  ____________________________________________   INITIAL IMPRESSION / ASSESSMENT AND PLAN / ED COURSE  Pertinent labs & imaging results that were available during my care of the patient were reviewed by me and considered in my medical decision making (see chart for details).  19 year old female who presents with continued generalized weakness associated with lightheadedness. Laboratory results notable for UTI. Will check troponin, EKG, d-dimer. Plan for IV fluid resuscitation, antiemetic and antibiotic.  ----------------------------------------- 1:44 AM on 12/11/2014 -----------------------------------------  Patient was mildly orthostatic. Negative troponin and d-dimer. Plan to infuse IV fluids, start antibiotic for UTI. Anticipate discharge home.  ----------------------------------------- 2:47 AM on 12/11/2014 -----------------------------------------  IV fluids completed. Patient feels much better. Strict return precautions given. Patient and spouse verbalized understanding and agree with plan of  care. ____________________________________________   FINAL CLINICAL IMPRESSION(S) / ED DIAGNOSES  Final diagnoses:  Orthostasis  UTI (lower urinary tract infection)  Weakness      Irean HongJade J Ashaya Raftery, MD 12/11/14 [phone]

## 2014-12-11 NOTE — ED Notes (Signed)
Patient to xray.

## 2014-12-15 DIAGNOSIS — F410 Panic disorder [episodic paroxysmal anxiety] without agoraphobia: Secondary | ICD-10-CM | POA: Insufficient documentation

## 2015-01-10 DIAGNOSIS — N3000 Acute cystitis without hematuria: Secondary | ICD-10-CM

## 2015-01-10 HISTORY — DX: Acute cystitis without hematuria: N30.00

## 2015-01-16 ENCOUNTER — Other Ambulatory Visit: Payer: Self-pay | Admitting: Unknown Physician Specialty

## 2015-01-16 DIAGNOSIS — R1011 Right upper quadrant pain: Secondary | ICD-10-CM

## 2015-01-19 ENCOUNTER — Ambulatory Visit
Admission: RE | Admit: 2015-01-19 | Discharge: 2015-01-19 | Disposition: A | Discharge status: Home / Self Care | Payer: Medicaid Other | Source: Ambulatory Visit | Attending: Unknown Physician Specialty | Admitting: Unknown Physician Specialty

## 2015-01-19 DIAGNOSIS — K8080 Other cholelithiasis without obstruction: Secondary | ICD-10-CM | POA: Insufficient documentation

## 2015-01-19 DIAGNOSIS — R1011 Right upper quadrant pain: Secondary | ICD-10-CM | POA: Diagnosis present

## 2015-01-22 ENCOUNTER — Encounter: Payer: Self-pay | Admitting: *Deleted

## 2015-01-29 ENCOUNTER — Encounter: Payer: Self-pay | Admitting: General Surgery

## 2015-01-29 ENCOUNTER — Ambulatory Visit (INDEPENDENT_AMBULATORY_CARE_PROVIDER_SITE_OTHER): Payer: Medicaid Other | Admitting: General Surgery

## 2015-01-29 VITALS — BP 110/60 | HR 68 | Resp 14 | Ht 61.0 in | Wt 126.0 lb

## 2015-01-29 DIAGNOSIS — K8020 Calculus of gallbladder without cholecystitis without obstruction: Secondary | ICD-10-CM

## 2015-01-29 HISTORY — DX: Calculus of gallbladder without cholecystitis without obstruction: K80.20

## 2015-01-29 NOTE — Progress Notes (Signed)
Patient ID: [name], female DOB: 11/19/1995, 19 y.o. MRN: [id number]  Chief Complaint   Patient presents with   .  OTHER     gallbladder    HPI  [name] is a 19 y.o. female here today for an evaluation of abdominal pain. She had an ultrasound done on 01/19/15. The patient states the pain started about a month ago. The pain was noticed after she first ate, worse after she eats greasy food does trigger pain. Pain is constant and radiates towards her upper back. Taking tylenol for the pain which is not helping.  HPI  Past Medical History   Diagnosis  Date   .  Anxiety     Past Surgical History   Procedure  Laterality  Date   .  Tonsillectomy     .  Adenoidectomy      Family History   Problem  Relation  Age of Onset   .  Cancer  Maternal Aunt      breast    Social History  Social History   Substance Use Topics   .  Smoking status:  Former Smoker     Quit date:  06/09/2014   .  Smokeless tobacco:  Never Used   .  Alcohol Use:  No      Comment: seldom    Allergies   Allergen  Reactions   .  Penicillins  Swelling and Anaphylaxis   .  Amoxicillin     Current Outpatient Prescriptions   Medication  Sig  Dispense  Refill   .  acetaminophen (TYLENOL) 325 MG tablet  Take by mouth.     .  hydrOXYzine (ATARAX/VISTARIL) 25 MG tablet  Take 25 mg by mouth.      No current facility-administered medications for this visit.    Review of Systems  Review of Systems  Constitutional: Positive for chills and appetite change. Negative for fever and fatigue.  Respiratory: Negative.  Cardiovascular: Positive for chest pain.   Blood pressure 110/60, pulse 68, resp. rate 14, height 5' 1" (1.549 m), weight 126 lb (57.153 kg), last menstrual period 01/15/2015.  Physical Exam  Physical Exam  Constitutional: She is oriented to person, place, and time. She appears well-developed and well-nourished.  Eyes: Conjunctivae are normal. No scleral icterus.  Neck: Neck supple.    Cardiovascular: Normal rate, regular rhythm and normal heart sounds.  Pulmonary/Chest: Effort normal and breath sounds normal.  Abdominal: Soft. Bowel sounds are normal. She exhibits no mass. There is no hepatosplenomegaly. There is tenderness in the right upper quadrant and left lower quadrant. There is no rigidity and negative Murphy's sign. No hernia.    Lymphadenopathy:  She has no cervical adenopathy.  Neurological: She is alert and oriented to person, place, and time.  Skin: Skin is warm and dry.  Psychiatric: Her behavior is normal.   Data Reviewed  CBC completed 12/10/2014 was normal.  Abdominal ultrasound dated 01/19/2015:  IMPRESSION:  Gallbladder sludge and tiny stones without evidence of acute  cholecystitis. If gallbladder dysfunction is strongly suspected,  nuclear medicine hepatobiliary scanning Steg[place]a be useful.  Common bile duct diameter 3.2 mm.  Assessment   Symptomatic cholelithiasis.   Plan   Options for management were reviewed: 1) oral dissolution therapy as she was still within 6 months of delivery anticipating 3-6 months for resolution of the stones. The importance of regular medication schedule maintenance to achieve dissolution was reviewed. She reports that she is a poor pill taker Vs   2) elective cholecystectomy.   Laparoscopic Cholecystectomy with Intraoperative Cholangiogram. The procedure, including it's potential risks and complications (including but not limited to infection, bleeding, injury to intra-abdominal organs or bile ducts, bile leak, poor cosmetic result, sepsis and death) were discussed with the patient in detail. Non-operative options, including their inherent risks (acute calculous cholecystitis with possible choledocholithiasis or gallstone pancreatitis, with the risk of ascending cholangitis, sepsis, and death) were discussed as well. The patient expressed and understanding of what we discussed and wishes to proceed with laparoscopic  cholecystectomy. The patient further understands that if it is technically not possible, or it is unsafe to proceed laparoscopically, that I will convert to an open cholecystectomy.  Surgery to be scheduled before her wedding 02/17/2015 and vacation.  Do not eat greasy foods or salads, use a heating pad at night.  Patient's surgery has been scheduled for 02-01-15 at [facility].  PCP: Harriett Burns, MD  Byrnett, Jeffrey W  01/29/2015, 11:24 AM  

## 2015-01-29 NOTE — Patient Instructions (Addendum)
    Cholelithiasis Cholelithiasis (also called gallstones) is a form of gallbladder disease in which gallstones form in your gallbladder. The gallbladder is an organ that stores bile made in the liver, which helps digest fats. Gallstones begin as small crystals and slowly grow into stones. Gallstone pain occurs when the gallbladder spasms and a gallstone is blocking the duct. Pain can also occur when a stone passes out of the duct.  RISK FACTORS  Being female.   Having multiple pregnancies. Health care providers sometimes advise removing diseased gallbladders before future pregnancies.   Being obese.  Eating a diet heavy in fried foods and fat.   Being older than 60 years and increasing age.   Prolonged use of medicines containing female hormones.   Having diabetes mellitus.   Rapidly losing weight.   Having a family history of gallstones (heredity).  SYMPTOMS  Nausea.   Vomiting.  Abdominal pain.   Yellowing of the skin (jaundice).   Sudden pain. It Burd persist from several minutes to several hours.  Fever.   Tenderness to the touch. In some cases, when gallstones do not move into the bile duct, people have no pain or symptoms. These are called "silent" gallstones.  TREATMENT Silent gallstones do not need treatment. In severe cases, emergency surgery Touchette be required. Options for treatment include:  Surgery to remove the gallbladder. This is the most common treatment.  Medicines. These do not always work and P[place]wels take 6-12 months or more to work.  Shock wave treatment (extracorporeal biliary lithotripsy). In this treatment an ultrasound machine sends shock waves to the gallbladder to break gallstones into smaller pieces that can pass into the intestines or be dissolved by medicine. HOME CARE INSTRUCTIONS   Only take over-the-counter or prescription medicines for pain, discomfort, or fever as directed by your health care provider.   Follow a low-fat diet  until seen again by your health care provider. Fat causes the gallbladder to contract, which can result in pain.   Follow up with your health care provider as directed. Attacks are almost always recurrent and surgery is usually required for permanent treatment.  SEEK IMMEDIATE MEDICAL CARE IF:   Your pain increases and is not controlled by medicines.   You have a fever or persistent symptoms for more than 2-3 days.   You have a fever and your symptoms suddenly get worse.   You have persistent nausea and vomiting.  MAKE SURE YOU:   Understand these instructions.  Will watch your condition.  Will get help right away if you are not doing well or get worse. Document Released: 12/07/202006 Document Revised: 01/26/2013 Document Reviewed: 11/17/2012 [facility] Patient Information 2015 [place], [place]. This information is not intended to replace advice given to you by your health care provider. Make sure you discuss any questions you have with your health care provider.   Patient's surgery has been scheduled for 02-01-15 at [name].

## 2015-01-29 NOTE — H&P (Signed)
Patient ID: SECILY WALTHOUR, female DOB: 01-04-1996, 19 y.o. MRN: [id number]  Chief Complaint   Patient presents with   .  OTHER     gallbladder    HPI  Sarabeth Karrie Doffing is a 19 y.o. female here today for an evaluation of abdominal pain. She had an ultrasound done on 01/19/15. The patient states the pain started about a month ago. The pain was noticed after she first ate, worse after she eats greasy food does trigger pain. Pain is constant and radiates towards her upper back. Taking tylenol for the pain which is not helping.  HPI  Past Medical History   Diagnosis  Date   .  Anxiety     Past Surgical History   Procedure  Laterality  Date   .  Tonsillectomy     .  Adenoidectomy      Family History   Problem  Relation  Age of Onset   .  Cancer  Maternal Aunt      breast    Social History  Social History   Substance Use Topics   .  Smoking status:  Former Smoker     Quit date:  06/09/2014   .  Smokeless tobacco:  Never Used   .  Alcohol Use:  No      Comment: seldom    Allergies   Allergen  Reactions   .  Penicillins  Swelling and Anaphylaxis   .  Amoxicillin     Current Outpatient Prescriptions   Medication  Sig  Dispense  Refill   .  acetaminophen (TYLENOL) 325 MG tablet  Take by mouth.     .  hydrOXYzine (ATARAX/VISTARIL) 25 MG tablet  Take 25 mg by mouth.      No current facility-administered medications for this visit.    Review of Systems  Review of Systems  Constitutional: Positive for chills and appetite change. Negative for fever and fatigue.  Respiratory: Negative.  Cardiovascular: Positive for chest pain.   Blood pressure 110/60, pulse 68, resp. rate 14, height  (1.549 m), weight 126 lb (57.153 kg), last menstrual period 01/15/2015.  Physical Exam  Physical Exam  Constitutional: She is oriented to person, place, and time. She appears well-developed and well-nourished.  Eyes: Conjunctivae are normal. No scleral icterus.  Neck: Neck supple.    Cardiovascular: Normal rate, regular rhythm and normal heart sounds.  Pulmonary/Chest: Effort normal and breath sounds normal.  Abdominal: Soft. Bowel sounds are normal. She exhibits no mass. There is no hepatosplenomegaly. There is tenderness in the right upper quadrant and left lower quadrant. There is no rigidity and negative Murphy's sign. No hernia.    Lymphadenopathy:  She has no cervical adenopathy.  Neurological: She is alert and oriented to person, place, and time.  Skin: Skin is warm and dry.  Psychiatric: Her behavior is normal.   Data Reviewed  CBC completed 12/10/2014 was normal.  Abdominal ultrasound dated 01/19/2015:  IMPRESSION:  Gallbladder sludge and tiny stones without evidence of acute  cholecystitis. If gallbladder dysfunction is strongly suspected,  nuclear medicine hepatobiliary scanning [place]patrick be useful.  Common bile duct diameter 3.2 mm.  Assessment   Symptomatic cholelithiasis.   Plan   Options for management were reviewed: 1) oral dissolution therapy as she was still within 6 months of delivery anticipating 3-6 months for resolution of the stones. The importance of regular medication schedule maintenance to achieve dissolution was reviewed. She reports that she is a poor pill taker Vs  2) elective cholecystectomy.   Laparoscopic Cholecystectomy with Intraoperative Cholangiogram. The procedure, including it's potential risks and complications (including but not limited to infection, bleeding, injury to intra-abdominal organs or bile ducts, bile leak, poor cosmetic result, sepsis and death) were discussed with the patient in detail. Non-operative options, including their inherent risks (acute calculous cholecystitis with possible choledocholithiasis or gallstone pancreatitis, with the risk of ascending cholangitis, sepsis, and death) were discussed as well. The patient expressed and understanding of what we discussed and wishes to proceed with laparoscopic  cholecystectomy. The patient further understands that if it is technically not possible, or it is unsafe to proceed laparoscopically, that I will convert to an open cholecystectomy.  Surgery to be scheduled before her wedding 02/17/2015 and vacation.  Do not eat greasy foods or salads, use a heating pad at night.  Patient's surgery has been scheduled for 02-01-15 at [facility].  PCP: Fulton Mole, MD  Earline Mayotte  01/29/2015, 11:24 AM

## 2015-01-30 ENCOUNTER — Encounter: Payer: Self-pay | Admitting: *Deleted

## 2015-01-30 ENCOUNTER — Inpatient Hospital Stay: Admission: RE | Admit: 2015-01-30 | Payer: Medicaid Other | Source: Ambulatory Visit

## 2015-01-30 NOTE — Patient Instructions (Signed)
  Your procedure is scheduled on: 02-01-15 Report to [facility] SAME [facility] SURGERY 2ND FLOOR To find out your arrival time please call [phone] between 1PM - 3PM on 01-31-15  Remember: Instructions that are not followed completely Berthold result in serious medical risk, up to and including death, or upon the discretion of your surgeon and anesthesiologist your surgery Shives need to be rescheduled.    _X___ 1. Do not eat food or drink liquids after midnight. No gum chewing or hard candies.     _X___ 2. No Alcohol for 24 hours before or after surgery.   ____ 3. Bring all medications with you on the day of surgery if instructed.    ____ 4. Notify your doctor if there is any change in your medical condition     (cold, fever, infections).     Do not wear jewelry, make-up, hairpins, clips or nail polish.  Do not wear lotions, powders, or perfumes. You [place] wear deodorant.  Do not shave 48 hours prior to surgery. Men [place]more shave face and neck.  Do not bring valuables to the hospital.    [facility] is not responsible for any belongings or valuables.               Contacts, dentures or bridgework Yeomans not be worn into surgery.  Leave your suitcase in the car. After surgery it Haggart be brought to your room.  For patients admitted to the hospital, discharge time is determined by your treatment team.   Patients discharged the day of surgery will not be allowed to drive home.   Please read over the following fact sheets that you were given:      ____ Take these medicines the morning of surgery with A SIP OF WATER:    1. NONE  2.   3.   4.  5.  6.  ____ Fleet Enema (as directed)   ____ Use CHG Soap as directed  ____ Use inhalers on the day of surgery  ____ Stop metformin 2 days prior to surgery    ____ Take 1/2 of usual insulin dose the night before surgery and none on the morning of surgery.   ____ Stop Coumadin/Plavix/aspirin-N/A  ____ Stop Anti-inflammatories-NO NSAIDS OR ASA  PRODUCTS-TYLENOL OK   ____ Stop supplements until after surgery.    ____ Bring C-Pap to the hospital.

## 2015-01-31 ENCOUNTER — Encounter: Payer: Self-pay | Admitting: *Deleted

## 2015-02-01 ENCOUNTER — Ambulatory Visit: Payer: Medicaid Other | Admitting: Anesthesiology

## 2015-02-01 ENCOUNTER — Encounter (HOSPITAL_BASED_OUTPATIENT_CLINIC_OR_DEPARTMENT_OTHER): Payer: Medicaid Other | Admitting: General Surgery

## 2015-02-01 ENCOUNTER — Ambulatory Visit: Payer: Medicaid Other

## 2015-02-01 ENCOUNTER — Encounter
Admission: RE | Disposition: A | Discharge status: Home / Self Care | Payer: Self-pay | Source: Ambulatory Visit | Attending: General Surgery

## 2015-02-01 ENCOUNTER — Encounter: Payer: Self-pay | Admitting: *Deleted

## 2015-02-01 ENCOUNTER — Ambulatory Visit
Admission: RE | Admit: 2015-02-01 | Discharge: 2015-02-01 | Disposition: A | Discharge status: Home / Self Care | Payer: Medicaid Other | Source: Ambulatory Visit | Attending: General Surgery | Admitting: General Surgery

## 2015-02-01 DIAGNOSIS — R002 Palpitations: Secondary | ICD-10-CM | POA: Insufficient documentation

## 2015-02-01 DIAGNOSIS — Z87891 Personal history of nicotine dependence: Secondary | ICD-10-CM | POA: Diagnosis not present

## 2015-02-01 DIAGNOSIS — Z803 Family history of malignant neoplasm of breast: Secondary | ICD-10-CM | POA: Diagnosis not present

## 2015-02-01 DIAGNOSIS — K8010 Calculus of gallbladder with chronic cholecystitis without obstruction: Secondary | ICD-10-CM | POA: Diagnosis not present

## 2015-02-01 DIAGNOSIS — Z79899 Other long term (current) drug therapy: Secondary | ICD-10-CM | POA: Insufficient documentation

## 2015-02-01 DIAGNOSIS — Z880 Allergy status to penicillin: Secondary | ICD-10-CM | POA: Diagnosis not present

## 2015-02-01 DIAGNOSIS — Z881 Allergy status to other antibiotic agents status: Secondary | ICD-10-CM | POA: Diagnosis not present

## 2015-02-01 DIAGNOSIS — F419 Anxiety disorder, unspecified: Secondary | ICD-10-CM | POA: Diagnosis not present

## 2015-02-01 DIAGNOSIS — K8020 Calculus of gallbladder without cholecystitis without obstruction: Secondary | ICD-10-CM

## 2015-02-01 HISTORY — DX: Headache, unspecified: R51.9

## 2015-02-01 HISTORY — DX: Cardiac arrhythmia, unspecified: I49.9

## 2015-02-01 HISTORY — DX: Anemia, unspecified: D64.9

## 2015-02-01 HISTORY — DX: Gastro-esophageal reflux disease without esophagitis: K21.9

## 2015-02-01 HISTORY — DX: Headache: R51

## 2015-02-01 HISTORY — PX: CHOLECYSTECTOMY: SHX55

## 2015-02-01 HISTORY — DX: Acute cystitis without hematuria: N30.00

## 2015-02-01 LAB — POCT PREGNANCY, URINE: Preg Test, Ur: NEGATIVE

## 2015-02-01 SURGERY — LAPAROSCOPIC CHOLECYSTECTOMY WITH INTRAOPERATIVE CHOLANGIOGRAM
Anesthesia: General | Wound class: Clean Contaminated

## 2015-02-01 MED ORDER — ONDANSETRON HCL 4 MG/2ML IJ SOLN
INTRAMUSCULAR | Status: DC | PRN
  Administered 2015-02-01: 4 mg via INTRAVENOUS

## 2015-02-01 MED ORDER — DEXAMETHASONE SODIUM PHOSPHATE 10 MG/ML IJ SOLN
INTRAMUSCULAR | Status: DC | PRN
  Administered 2015-02-01: 10 mg via INTRAVENOUS

## 2015-02-01 MED ORDER — HYDROCODONE-ACETAMINOPHEN 5-325 MG PO TABS: 1.0000 | ORAL_TABLET | ORAL | Status: DC | PRN

## 2015-02-01 MED ORDER — PROMETHAZINE HCL 25 MG/ML IJ SOLN
INTRAMUSCULAR | Status: AC
  Filled 2015-02-01: qty 1

## 2015-02-01 MED ORDER — MIDAZOLAM HCL 5 MG/5ML IJ SOLN
INTRAMUSCULAR | Status: DC | PRN
  Administered 2015-02-01: 2 mg via INTRAVENOUS

## 2015-02-01 MED ORDER — FENTANYL CITRATE (PF) 250 MCG/5ML IJ SOLN
INTRAMUSCULAR | Status: DC | PRN
  Administered 2015-02-01: 100 ug via INTRAVENOUS
  Administered 2015-02-01: 50 ug via INTRAVENOUS

## 2015-02-01 MED ORDER — FAMOTIDINE 20 MG PO TABS
20.0000 mg | ORAL_TABLET | Freq: Once | ORAL | Status: AC
  Administered 2015-02-01: 20 mg via ORAL

## 2015-02-01 MED ORDER — HYDROMORPHONE HCL 1 MG/ML IJ SOLN
INTRAMUSCULAR | Status: AC
  Filled 2015-02-01: qty 1

## 2015-02-01 MED ORDER — FENTANYL CITRATE (PF) 100 MCG/2ML IJ SOLN
25.0000 ug | INTRAMUSCULAR | Status: DC | PRN
  Administered 2015-02-01 (×2): 50 ug via INTRAVENOUS

## 2015-02-01 MED ORDER — FAMOTIDINE 20 MG PO TABS
ORAL_TABLET | ORAL | Status: AC
  Administered 2015-02-01: 20 mg via ORAL
  Filled 2015-02-01: qty 1

## 2015-02-01 MED ORDER — PROPOFOL 10 MG/ML IV BOLUS
INTRAVENOUS | Status: DC | PRN
  Administered 2015-02-01: 150 mg via INTRAVENOUS

## 2015-02-01 MED ORDER — KETOROLAC TROMETHAMINE 30 MG/ML IJ SOLN
INTRAMUSCULAR | Status: DC | PRN
  Administered 2015-02-01: 30 mg via INTRAVENOUS

## 2015-02-01 MED ORDER — NEOSTIGMINE METHYLSULFATE 10 MG/10ML IV SOLN
INTRAVENOUS | Status: DC | PRN
  Administered 2015-02-01: 4 mg via INTRAVENOUS

## 2015-02-01 MED ORDER — ACETAMINOPHEN 10 MG/ML IV SOLN
INTRAVENOUS | Status: AC
  Filled 2015-02-01: qty 100

## 2015-02-01 MED ORDER — LIDOCAINE HCL (CARDIAC) 20 MG/ML IV SOLN
INTRAVENOUS | Status: DC | PRN
  Administered 2015-02-01: 80 mg via INTRAVENOUS

## 2015-02-01 MED ORDER — SODIUM CHLORIDE 0.9 % IJ SOLN
INTRAMUSCULAR | Status: AC
  Filled 2015-02-01: qty 50

## 2015-02-01 MED ORDER — ATROPINE SULFATE 0.4 MG/ML IJ SOLN
INTRAMUSCULAR | Status: DC | PRN
  Administered 2015-02-01: 0.4 mg via INTRAVENOUS

## 2015-02-01 MED ORDER — METOCLOPRAMIDE HCL 5 MG/ML IJ SOLN
INTRAMUSCULAR | Status: DC | PRN
  Administered 2015-02-01: 10 mg via INTRAVENOUS

## 2015-02-01 MED ORDER — ACETAMINOPHEN 10 MG/ML IV SOLN
INTRAVENOUS | Status: DC | PRN
  Administered 2015-02-01: 1000 mg via INTRAVENOUS

## 2015-02-01 MED ORDER — PROMETHAZINE HCL 25 MG/ML IJ SOLN
6.2500 mg | INTRAMUSCULAR | Status: DC | PRN
  Administered 2015-02-01: 12.5 mg via INTRAVENOUS

## 2015-02-01 MED ORDER — FENTANYL CITRATE (PF) 100 MCG/2ML IJ SOLN
INTRAMUSCULAR | Status: AC
  Filled 2015-02-01: qty 2

## 2015-02-01 MED ORDER — GLYCOPYRROLATE 0.2 MG/ML IJ SOLN
INTRAMUSCULAR | Status: DC | PRN
  Administered 2015-02-01: 0.6 mg via INTRAVENOUS
  Administered 2015-02-01: 0.2 mg via INTRAVENOUS

## 2015-02-01 MED ORDER — SODIUM CHLORIDE 0.9 % IV SOLN
INTRAVENOUS | Status: DC | PRN
  Administered 2015-02-01: 4 mL

## 2015-02-01 MED ORDER — ROCURONIUM BROMIDE 100 MG/10ML IV SOLN
INTRAVENOUS | Status: DC | PRN
  Administered 2015-02-01: 30 mg via INTRAVENOUS
  Administered 2015-02-01: 10 mg via INTRAVENOUS

## 2015-02-01 MED ORDER — HYDROMORPHONE HCL 1 MG/ML IJ SOLN
0.2500 mg | INTRAMUSCULAR | Status: DC | PRN
  Administered 2015-02-01 (×2): 0.5 mg via INTRAVENOUS

## 2015-02-01 MED ORDER — LACTATED RINGERS IV SOLN
INTRAVENOUS | Status: DC
  Administered 2015-02-01: 14:00:00 via INTRAVENOUS

## 2015-02-01 SURGICAL SUPPLY — 39 items
APPLIER CLIP ROT 10 11.4 M/L (STAPLE) ×3
BLADE SURG 11 STRL SS SAFETY (MISCELLANEOUS) ×3 IMPLANT
CANISTER SUCT 1200ML W/VALVE (MISCELLANEOUS) ×3 IMPLANT
CANNULA DILATOR  5MM W/SLV (CANNULA) ×2
CANNULA DILATOR 10 W/SLV (CANNULA) ×2 IMPLANT
CANNULA DILATOR 10MM W/SLV (CANNULA) ×1
CANNULA DILATOR 5 W/SLV (CANNULA) ×4 IMPLANT
CATH CHOLANG 76X19 KUMAR (CATHETERS) ×3 IMPLANT
CHLORAPREP W/TINT 26ML (MISCELLANEOUS) ×3 IMPLANT
CLIP APPLIE ROT 10 11.4 M/L (STAPLE) ×1 IMPLANT
CLOSURE WOUND 1/2 X4 (GAUZE/BANDAGES/DRESSINGS) ×1
CONRAY 60ML FOR OR (MISCELLANEOUS) ×3 IMPLANT
DISSECTOR KITTNER STICK (MISCELLANEOUS) IMPLANT
DISSECTORS/KITTNER STICK (MISCELLANEOUS)
DRAPE SHEET LG 3/4 BI-LAMINATE (DRAPES) ×3 IMPLANT
DRESSING TELFA 4X3 1S ST N-ADH (GAUZE/BANDAGES/DRESSINGS) ×3 IMPLANT
DRSG TEGADERM 2-3/8X2-3/4 SM (GAUZE/BANDAGES/DRESSINGS) ×12 IMPLANT
ENDOPOUCH RETRIEVER 10 (MISCELLANEOUS) IMPLANT
GLOVE BIO SURGEON STRL SZ7.5 (GLOVE) ×9 IMPLANT
GLOVE INDICATOR 8.0 STRL GRN (GLOVE) ×9 IMPLANT
GOWN STRL REUS W/ TWL LRG LVL3 (GOWN DISPOSABLE) ×3 IMPLANT
GOWN STRL REUS W/TWL LRG LVL3 (GOWN DISPOSABLE) ×6
IRRIGATION STRYKERFLOW (MISCELLANEOUS) ×1 IMPLANT
IRRIGATOR STRYKERFLOW (MISCELLANEOUS) ×3
IV LACTATED RINGERS 1000ML (IV SOLUTION) ×3 IMPLANT
KIT RM TURNOVER STRD PROC AR (KITS) ×3 IMPLANT
LABEL OR SOLS (LABEL) ×3 IMPLANT
NS IRRIG 500ML POUR BTL (IV SOLUTION) ×3 IMPLANT
PACK LAP CHOLECYSTECTOMY (MISCELLANEOUS) ×3 IMPLANT
PAD GROUND ADULT SPLIT (MISCELLANEOUS) ×3 IMPLANT
SCISSORS METZENBAUM CVD 33 (INSTRUMENTS) ×3 IMPLANT
SEAL FOR SCOPE WARMER C3101 (MISCELLANEOUS) ×3 IMPLANT
STRIP CLOSURE SKIN 1/2X4 (GAUZE/BANDAGES/DRESSINGS) ×2 IMPLANT
SUT VIC AB 0 CT2 27 (SUTURE) ×3 IMPLANT
SUT VIC AB 4-0 FS2 27 (SUTURE) ×3 IMPLANT
SWABSTK COMLB BENZOIN TINCTURE (MISCELLANEOUS) ×3 IMPLANT
TROCAR XCEL NON-BLD 11X100MML (ENDOMECHANICALS) ×3 IMPLANT
TUBING INSUFFLATOR HI FLOW (MISCELLANEOUS) ×3 IMPLANT
WATER STERILE IRR 1000ML POUR (IV SOLUTION) ×3 IMPLANT

## 2015-02-01 NOTE — Anesthesia Procedure Notes (Signed)
Procedure Name: Intubation Date/Time: 02/01/2015 2:07 PM Performed by: Chong Sicilian Pre-anesthesia Checklist: Patient identified, Emergency Drugs available, Suction available, Patient being monitored and Timeout performed Patient Re-evaluated:Patient Re-evaluated prior to inductionOxygen Delivery Method: Circle system utilized Preoxygenation: Pre-oxygenation with 100% oxygen Intubation Type: IV induction Ventilation: Mask ventilation without difficulty Laryngoscope Size: Mac and 3 Grade View: Grade I Tube type: Oral Number of attempts: 1 Airway Equipment and Method: Stylet Placement Confirmation: ETT inserted through vocal cords under direct vision,  positive ETCO2 and breath sounds checked- equal and bilateral Secured at: 20 cm Tube secured with: Tape Dental Injury: Teeth and Oropharynx as per pre-operative assessment

## 2015-02-01 NOTE — Progress Notes (Signed)
Pt. Now resting calmly and quietly, no acute distress.

## 2015-02-01 NOTE — Progress Notes (Signed)
The patient's mother, fiancee and family were informed of the episode of severe bradycardia and that the patient had no ill effects from the event.

## 2015-02-01 NOTE — Transfer of Care (Signed)
Immediate Anesthesia Transfer of Care Note  Patient: [name]  Procedure(s) Performed: Procedure(s): LAPAROSCOPIC CHOLECYSTECTOMY WITH INTRAOPERATIVE CHOLANGIOGRAM (N/A)  Patient Location: PACU  Anesthesia Type:General  Level of Consciousness: awake, alert  and oriented  Airway & Oxygen Therapy: Patient Spontanous Breathing and Patient connected to face mask oxygen  Post-op Assessment: Report given to RN and Post -op Vital signs reviewed and stable  Post vital signs: Reviewed and stable  Last Vitals: 96.3 72 hr 16resp 116/69 100% Filed Vitals:   02/01/15 1321  BP: 108/62  Pulse: 50  Temp: 36.9 C  Resp: 16    Complications: No apparent anesthesia complications

## 2015-02-01 NOTE — Discharge Instructions (Addendum)
AMBULATORY SURGERY  °DISCHARGE INSTRUCTIONS ° ° °1) The drugs that you were given will stay in your system until tomorrow so for the next 24 hours you should not: ° °A) Drive an automobile °B) Make any legal decisions °C) Drink any alcoholic beverage ° ° °2) You Maloof resume regular meals tomorrow.  Today it is better to start with liquids and gradually work up to solid foods. ° °You Welte eat anything you prefer, but it is better to start with liquids, then soup and crackers, and gradually work up to solid foods. ° ° °3) Please notify your doctor immediately if you have any unusual bleeding, trouble breathing, redness and pain at the surgery site, drainage, fever, or pain not relieved by medication. ° ° ° °4) Additional Instructions: ° ° ° ° ° ° ° °Please contact your physician with any problems or Same Day Surgery at [phone], Monday through Friday 6 am to 4 pm, or [place] at [facility] number at [phone].General Anesthesia °General anesthesia is a sleep-like state of non-feeling produced by medicines (anesthetics). General anesthesia prevents you from being alert and feeling pain during a medical procedure. Your caregiver Diver recommend general anesthesia if your procedure: °· Is long. °· Is painful or uncomfortable. °· Would be frightening to see or hear. °· Requires you to be still. °· Affects your breathing. °· Causes significant blood loss. °LET YOUR CAREGIVER KNOW ABOUT: °· Allergies to food or medicine. °· Medicines taken, including vitamins, herbs, eyedrops, over-the-counter medicines, and creams. °· Use of steroids (by mouth or creams). °· Previous problems with anesthetics or numbing medicines, including problems experienced by relatives. °· History of bleeding problems or blood clots. °· Previous surgeries and types of anesthetics received. °· Possibility of pregnancy, if this applies. °· Use of cigarettes, alcohol, or illegal drugs. °· Any health condition(s), especially diabetes, sleep  apnea, and high blood pressure. °RISKS AND COMPLICATIONS °General anesthesia rarely causes complications. However, if complications do occur, they can be life threatening. Complications include: °· A lung infection. °· A stroke. °· A heart attack. °· Waking up during the procedure. When this occurs, the patient Picha be unable to move and communicate that he or she is awake. The patient Tenny feel severe pain. °Older adults and adults with serious medical problems are more likely to have complications than adults who are young and healthy. Some complications can be prevented by answering all of your caregiver's questions thoroughly and by following all pre-procedure instructions. It is important to tell your caregiver if any of the pre-procedure instructions, especially those related to diet, were not followed. Any food or liquid in the stomach can cause problems when you are under general anesthesia. °BEFORE THE PROCEDURE °· Ask your caregiver if you will have to spend the night at the hospital. If you will not have to spend the night, arrange to have an adult drive you and stay with you for 24 hours. °· Follow your caregiver's instructions if you are taking dietary supplements or medicines. Your caregiver Reddix tell you to stop taking them or to reduce your dosage. °· Do not smoke for as long as possible before your procedure. If possible, stop smoking 3-6 weeks before the procedure. °· Do not take new dietary supplements or medicines within 1 week of your procedure unless your caregiver approves them. °· Do not eat within 8 hours of your procedure or as directed by your caregiver. Drink only clear liquids, such as water, black coffee (without milk or cream),   and fruit juices (without pulp). °· Do not drink within 3 hours of your procedure or as directed by your caregiver. °· You Cardon brush your teeth on the morning of the procedure, but make sure to spit out the toothpaste and water when finished. °PROCEDURE  °You will  receive anesthetics through a mask, through an intravenous (IV) access tube, or through both. A doctor who specializes in anesthesia (anesthesiologist) or a nurse who specializes in anesthesia (nurse anesthetist) or both will stay with you throughout the procedure to make sure you remain unconscious. He or she will also watch your blood pressure, pulse, and oxygen levels to make sure that the anesthetics do not cause any problems. Once you are asleep, a breathing tube or mask Passow be used to help you breathe. °AFTER THE PROCEDURE °You will wake up after the procedure is complete. You Huck be in the room where the procedure was performed or in a recovery area. You Maxim have a sore throat if a breathing tube was used. You Soutar also feel: °· Dizzy. °· Weak. °· Drowsy. °· Confused. °· Nauseous. °· Cold. °These are all normal responses and can be expected to last for up to 24 hours after the procedure is complete. A caregiver will tell you when you are ready to go home. This will usually be when you are fully awake and in stable condition. °Document Released: 09/02/2007 Document Revised: 10/10/2013 Document Reviewed: 09/24/2011 °[employer]® Patient Information ©2015 [employer]. This information is not intended to replace advice given to you by your health care provider. Make sure you discuss any questions you have with your health care provider. ° °

## 2015-02-01 NOTE — Op Note (Signed)
Preoperative diagnosis: Chronic cholecystitis and cholelithiasis.  Postoperative diagnosis: Same.  Operative procedure: Laparoscopic cholecystectomy with intraoperative cholangio-grams.  Operating surgeons: Donnalee Curry, M.D.  Anesthesia: Gen. endotracheal.  Estimated blood loss: 5 mL.  Clinical note: This 19 year old illness 4 months postpartum. She develops and mesocolon last this. She is in for elective cholecystectomy.  Operative note with the patient under adequate general endotracheal anesthesia the abdomen was prepped with chlor prep and draped. An Trendelenburg position a varies needle was placed through a trans-umbilical incision. The abdomen was initially insufflated with CO2 a 10 mmHg pressure after the hanging drop test confirmed location within the peritoneal cavity. A 10 mm step port was expanded. The patient was rolled to the reverse Trendelenburg position and rolled to the left. An 11 mm XL port was placed in the epigastrium. At this time the patient was noted to have bradycardia with a fall in her heart rate from 80-40. Insufflation pressure was dropped to 8, but within a short period of time the patient had transient asystole. Abdominal insufflation pressure was immediately released. The patient responded promptly to anesthesia interventions with return to normal sinus rhythm.  The abdomen was reinsufflated at 8 mmHg pressure. 25 mm step ports were placed laterally under direct vision. Of note, inspection of initial camera placement showed erythema of the uterine wall as well as both tubes and the ovaries run involved.  The gallbladder showed evidence of chronic cholecystitis with multiple areas of adhesions from the omentum to the undersurface the gallbladder. These were taken down with cautery dissection. The gallbladder was placed on cephalad traction in the neck of the gallbladder cleared. Fluoroscopic cholangiograms were completed using 4 mL of one half strength Conray 60.  This showed prompt filling of the right and left hepatic ducts and free flow into the duodenum. The cystic duct and branches of the cystic artery were doubly clipped and divided. The gallbladder was then removed from the liver bed making use of hook cautery dissection. He was delivered to the umbilical port site without incident. After reestablishing pneumoperitoneum inspection of the epigastric site showed no evidence of injury from initial port placement. The right upper quadrant was irrigated with lactated Ringer solution. Good hemostasis was noted. The abdomen was then desufflated and port Road under direct vision.  The fascia at the umbilicus and the epigastric port were closed with 0 Vicryls sutures. The skin was closed with 4-0 Vicryls subcutaneous sutures. Benzoin, Steri-Strips, Telfa and Tegaderm dressings were applied.  The patient tolerated the procedure well and was taken to recovery in stable condition.

## 2015-02-01 NOTE — H&P (Signed)
Patient remains symptomatic with her gallstones. No change in cardo pulmonary history. For cholecystectomy today.

## 2015-02-01 NOTE — Anesthesia Preprocedure Evaluation (Signed)
Anesthesia Evaluation  Patient identified by MRN, date of birth, ID band Patient awake    Reviewed: Allergy & Precautions, H&P , NPO status , Patient's Chart, lab work & pertinent test results, reviewed documented beta blocker date and time   History of Anesthesia Complications (+) PONV and history of anesthetic complications  Airway Mallampati: I  TM Distance: >3 FB Neck ROM: full    Dental no notable dental hx. (+) Teeth Intact   Pulmonary former smoker,  breath sounds clear to auscultation  Pulmonary exam normal       Cardiovascular Exercise Tolerance: Good Normal cardiovascular exam+ dysrhythmias (occasional palpitations) Rhythm:regular Rate:Normal     Neuro/Psych negative neurological ROS  negative psych ROS   GI/Hepatic Neg liver ROS, GERD-  ,  Endo/Other  negative endocrine ROS  Renal/GU negative Renal ROS  negative genitourinary   Musculoskeletal   Abdominal   Peds  Hematology negative hematology ROS (+)   Anesthesia Other Findings Past Medical History:   Anxiety                                                      Anemia                                                       Bladder infection, acute                        01-10-2015       Comment:completed antibiotics and denies any s&s of               infection (01-30-15)   Headache                                                       Comment:MIGRAINES DAILY   GERD (gastroesophageal reflux disease)                         Comment:NO MEDS   Dysrhythmia                                                    Comment:occ irregular heart beat per pt   Reproductive/Obstetrics negative OB ROS                             Anesthesia Physical Anesthesia Plan  ASA: I  Anesthesia Plan: General   Post-op Pain Management:    Induction:   Airway Management Planned:   Additional Equipment:   Intra-op Plan:   Post-operative Plan:    Informed Consent: I have reviewed the patients History and Physical, chart, labs and discussed the procedure including the risks, benefits and alternatives for the proposed anesthesia with the patient or authorized representative who has indicated his/her understanding and acceptance.  Dental Advisory Given  Plan Discussed with: Anesthesiologist, CRNA and Surgeon  Anesthesia Plan Comments:         Anesthesia Quick Evaluation

## 2015-02-01 NOTE — Progress Notes (Signed)
On arrival to PACU patient moaning and restless, asking for her friend, yelling at staff and wanting pain meds. Reassured patient to calm down and be patient.  Patient later apologized and was calmer.

## 2015-02-02 ENCOUNTER — Encounter: Payer: Self-pay | Admitting: General Surgery

## 2015-02-05 ENCOUNTER — Emergency Department: Payer: Medicaid Other

## 2015-02-05 ENCOUNTER — Encounter: Payer: Self-pay | Admitting: Emergency Medicine

## 2015-02-05 ENCOUNTER — Emergency Department
Admission: EM | Admit: 2015-02-05 | Discharge: 2015-02-05 | Disposition: A | Discharge status: Home / Self Care | Payer: Medicaid Other | Attending: Emergency Medicine | Admitting: Emergency Medicine

## 2015-02-05 DIAGNOSIS — R1012 Left upper quadrant pain: Secondary | ICD-10-CM | POA: Insufficient documentation

## 2015-02-05 DIAGNOSIS — Z3202 Encounter for pregnancy test, result negative: Secondary | ICD-10-CM | POA: Insufficient documentation

## 2015-02-05 DIAGNOSIS — G8918 Other acute postprocedural pain: Secondary | ICD-10-CM | POA: Insufficient documentation

## 2015-02-05 DIAGNOSIS — Z880 Allergy status to penicillin: Secondary | ICD-10-CM | POA: Diagnosis not present

## 2015-02-05 DIAGNOSIS — Z87891 Personal history of nicotine dependence: Secondary | ICD-10-CM | POA: Diagnosis not present

## 2015-02-05 DIAGNOSIS — Z9049 Acquired absence of other specified parts of digestive tract: Secondary | ICD-10-CM | POA: Insufficient documentation

## 2015-02-05 DIAGNOSIS — K5901 Slow transit constipation: Secondary | ICD-10-CM | POA: Diagnosis not present

## 2015-02-05 LAB — CBC WITH DIFFERENTIAL/PLATELET
Basophils Absolute: 0 10*3/uL (ref 0–0.1)
Basophils Relative: 1 %
Eosinophils Absolute: 0.3 10*3/uL (ref 0–0.7)
Eosinophils Relative: 5 %
HEMATOCRIT: 39.1 % (ref 35.0–47.0)
HEMOGLOBIN: 12.7 g/dL (ref 12.0–16.0)
LYMPHS ABS: 1.5 10*3/uL (ref 1.0–3.6)
LYMPHS PCT: 22 %
MCH: 27.5 pg (ref 26.0–34.0)
MCHC: 32.4 g/dL (ref 32.0–36.0)
MCV: 84.8 fL (ref 80.0–100.0)
MONO ABS: 0.7 10*3/uL (ref 0.2–0.9)
MONOS PCT: 11 %
NEUTROS ABS: 4.3 10*3/uL (ref 1.4–6.5)
NEUTROS PCT: 61 %
Platelets: 164 10*3/uL (ref 150–440)
RBC: 4.61 MIL/uL (ref 3.80–5.20)
RDW: 13.5 % (ref 11.5–14.5)
WBC: 6.9 10*3/uL (ref 3.6–11.0)

## 2015-02-05 LAB — COMPREHENSIVE METABOLIC PANEL
ALK PHOS: 67 U/L (ref 38–126)
ALT: 41 U/L (ref 14–54)
ANION GAP: 5 (ref 5–15)
AST: 22 U/L (ref 15–41)
Albumin: 3.7 g/dL (ref 3.5–5.0)
BILIRUBIN TOTAL: 0.4 mg/dL (ref 0.3–1.2)
BUN: 13 mg/dL (ref 6–20)
CALCIUM: 8.6 mg/dL — AB (ref 8.9–10.3)
CO2: 24 mmol/L (ref 22–32)
Chloride: 108 mmol/L (ref 101–111)
Creatinine, Ser: 0.79 mg/dL (ref 0.44–1.00)
GFR calc Af Amer: >60 mL/min (ref >60)
Glucose, Bld: 100 mg/dL — ABNORMAL HIGH (ref 65–99)
POTASSIUM: 3.7 mmol/L (ref 3.5–5.1)
Sodium: 137 mmol/L (ref 135–145)
TOTAL PROTEIN: 6.4 g/dL — AB (ref 6.5–8.1)

## 2015-02-05 LAB — URINALYSIS COMPLETE WITH MICROSCOPIC (ARMC ONLY)
BILIRUBIN URINE: NEGATIVE
Bacteria, UA: NONE SEEN
GLUCOSE, UA: NEGATIVE mg/dL
Hgb urine dipstick: NEGATIVE
KETONES UR: NEGATIVE mg/dL
Nitrite: NEGATIVE
PH: 6 (ref 5.0–8.0)
Protein, ur: NEGATIVE mg/dL
Specific Gravity, Urine: 1.018 (ref 1.005–1.030)

## 2015-02-05 LAB — PREGNANCY, URINE: Preg Test, Ur: NEGATIVE

## 2015-02-05 LAB — SURGICAL PATHOLOGY

## 2015-02-05 MED ORDER — POLYETHYLENE GLYCOL 3350 17 G PO PACK: 17.0000 g | PACK | Freq: Every day | ORAL | Status: DC

## 2015-02-05 MED ORDER — IBUPROFEN 800 MG PO TABS: 800.0000 mg | ORAL_TABLET | Freq: Three times a day (TID) | ORAL | Status: DC | PRN

## 2015-02-05 MED ORDER — IBUPROFEN 800 MG PO TABS
800.0000 mg | ORAL_TABLET | Freq: Once | ORAL | Status: AC
Start: 2015-02-05 — End: 2015-02-05
  Administered 2015-02-05: 800 mg via ORAL
  Filled 2015-02-05: qty 1

## 2015-02-05 NOTE — ED Notes (Signed)
Pt states she had her gallbladder removed on Thursday and yesterday she started having LUQ abd. Pain with nausea, states she has not had a BM since Thursday

## 2015-02-05 NOTE — ED Provider Notes (Signed)
[facility] Emergency Department Provider Note     Time seen: ----------------------------------------- 8:19 AM on 02/05/2015 -----------------------------------------    I have reviewed the triage vital signs and the nursing notes.   HISTORY  Chief Complaint Post-op Problem    HPI [name] is a 19 y.o. female who presents to ER for abdominal pain. Patient states she had a laparoscopic cholecystectomy 3 days ago, has not moved her bowels since then. She denies fevers or chills, has some left lower quadrant pain also some left upper quadrant pain that radiates into her chest. Nothing makes her symptoms better or worse, states she has been taking pain medicine after the surgery because it made her feel weird. She is able to pass gas.   Past Medical History  Diagnosis Date  . Anxiety   . Anemia   . Bladder infection, acute 01-10-2015    completed antibiotics and denies any s&s of infection (01-30-15)  . Headache     MIGRAINES DAILY  . GERD (gastroesophageal reflux disease)     NO MEDS  . Dysrhythmia     occ irregular heart beat per pt    Patient Active Problem List   Diagnosis Date Noted  . Gallstones 01/29/2015    Past Surgical History  Procedure Laterality Date  . Tonsillectomy    . Adenoidectomy    . Wisdom tooth extraction    . Cholecystectomy N/A 02/01/2015    Procedure: LAPAROSCOPIC CHOLECYSTECTOMY WITH INTRAOPERATIVE CHOLANGIOGRAM;  Surgeon: Earline Mayotte, MD;  Location: [facility] ORS;  Service: General;  Laterality: N/A;    Allergies Penicillins and Amoxicillin  Social History Social History  Substance Use Topics  . Smoking status: Former Smoker -- 0.50 packs/day for 8 years    Types: Cigarettes    Quit date: 11/08/2014  . Smokeless tobacco: Never Used  . Alcohol Use: 0.0 oz/week    0 Standard drinks or equivalent per week     Comment: seldom    Review of Systems Constitutional: Negative for fever. Eyes: Negative for  visual changes. ENT: Negative for sore throat. Cardiovascular: Negative for chest pain. Respiratory: Negative for shortness of breath. Gastrointestinal: Positive for abdominal pain Genitourinary: Negative for dysuria. Musculoskeletal: Negative for back pain. Skin: Negative for rash. Neurological: Negative for headaches, focal weakness or numbness.  10-point ROS otherwise negative.  ____________________________________________   PHYSICAL EXAM:  VITAL SIGNS: ED Triage Vitals  Enc Vitals Group     BP 02/05/15 0816 123/68 mmHg     Pulse Rate 02/05/15 0816 73     Resp 02/05/15 0816 18     Temp 02/05/15 0816 98 F (36.7 C)     Temp Source 02/05/15 0816 Oral     SpO2 02/05/15 0816 100 %     Weight 02/05/15 0816 126 lb (57.153 kg)     Height 02/05/15 0816 5\' 1"  (1.549 m)     Head Cir --      Peak Flow --      Pain Score 02/05/15 0817 8     Pain Loc --      Pain Edu? --      Excl. in GC? --     Constitutional: Alert and oriented. Well appearing and in no distress. Eyes: Conjunctivae are normal. PERRL. Normal extraocular movements. ENT   Head: Normocephalic and atraumatic.   Nose: No congestion/rhinnorhea.   Mouth/Throat: Mucous membranes are moist.   Neck: No stridor. Cardiovascular: Normal rate, regular rhythm. Normal and symmetric distal pulses are present  in all extremities. No murmurs, rubs, or gallops. Respiratory: Normal respiratory effort without tachypnea nor retractions. Breath sounds are clear and equal bilaterally. No wheezes/rales/rhonchi. Gastrointestinal: Diffuse abdominal tenderness that seems to worsen the left side, no rebound or guarding. Hyperactive bowel sounds. Incision sites are clean dry and intact. No signs of infection or cellulitis Musculoskeletal: Nontender with normal range of motion in all extremities. No joint effusions.  No lower extremity tenderness nor edema. Neurologic:  Normal speech and language. No gross focal neurologic deficits  are appreciated. Speech is normal. No gait instability. Skin:  Skin is warm, dry and intact. No rash noted. Psychiatric: Mood and affect are normal. Speech and behavior are normal. Patient exhibits appropriate insight and judgment.  ____________________________________________  ED COURSE:  Pertinent labs & imaging results that were available during my care of the patient were reviewed by me and considered in my medical decision making (see chart for details). Patient is in no acute distress, likely normal postoperative state, mildly constipated. ____________________________________________    LABS (pertinent positives/negatives)  Labs Reviewed  URINALYSIS COMPLETEWITH MICROSCOPIC ([facility] ONLY) - Abnormal; Notable for the following:    Color, Urine YELLOW (*)    APPearance HAZY (*)    Leukocytes, UA 3+ (*)    Squamous Epithelial / LPF 6-30 (*)    All other components within normal limits  CBC WITH DIFFERENTIAL/PLATELET  PREGNANCY, URINE  COMPREHENSIVE METABOLIC PANEL    RADIOLOGY Images were viewed by me  Abdomen flat and erect IMPRESSION: 1. Nonobstructive bowel gas pattern. 2. No pneumoperitoneum. 3. Moderate stool volume suggestive of mild constipation. ____________________________________________  FINAL ASSESSMENT AND PLAN  Postoperative pain  Plan: Patient with labs and imaging as dictated above. Symptoms likely secondary to constipation. She'll be discharged with MiraLAX and encouraged to continue outpatient postsurgical follow-up as directed.   Emily Filbert, MD   Emily Filbert, MD 02/05/15 [phone]

## 2015-02-05 NOTE — Discharge Instructions (Signed)
Constipation °Constipation is when a person has fewer than three bowel movements a week, has difficulty having a bowel movement, or has stools that are dry, hard, or larger than normal. As people grow older, constipation is more common. If you try to fix constipation with medicines that make you have a bowel movement (laxatives), the problem Welker get worse. Long-term laxative use Mcandrew cause the muscles of the colon to become weak. A low-fiber diet, not taking in enough fluids, and taking certain medicines Winningham make constipation worse.  °CAUSES  °· Certain medicines, such as antidepressants, pain medicine, iron supplements, antacids, and water pills.   °· Certain diseases, such as diabetes, irritable bowel syndrome (IBS), thyroid disease, or depression.   °· Not drinking enough water.   °· Not eating enough fiber-rich foods.   °· Stress or travel.   °· Lack of physical activity or exercise.   °· Ignoring the urge to have a bowel movement.   °· Using laxatives too much.   °SIGNS AND SYMPTOMS  °· Having fewer than three bowel movements a week.   °· Straining to have a bowel movement.   °· Having stools that are hard, dry, or larger than normal.   °· Feeling full or bloated.   °· Pain in the lower abdomen.   °· Not feeling relief after having a bowel movement.   °DIAGNOSIS  °Your health care provider will take a medical history and perform a physical exam. Further testing Goodwill be done for severe constipation. Some tests Sadowski include: °· A barium enema X-ray to examine your rectum, colon, and, sometimes, your small intestine.   °· A sigmoidoscopy to examine your lower colon.   °· A colonoscopy to examine your entire colon. °TREATMENT  °Treatment will depend on the severity of your constipation and what is causing it. Some dietary treatments include drinking more fluids and eating more fiber-rich foods. Lifestyle treatments Peretti include regular exercise. If these diet and lifestyle recommendations do not help, your health care  provider Zou recommend taking over-the-counter laxative medicines to help you have bowel movements. Prescription medicines Godbee be prescribed if over-the-counter medicines do not work.  °HOME CARE INSTRUCTIONS  °· Eat foods that have a lot of fiber, such as fruits, vegetables, whole grains, and beans. °· Limit foods high in fat and processed sugars, such as french fries, hamburgers, cookies, candies, and soda.   °· A fiber supplement Scadden be added to your diet if you cannot get enough fiber from foods.   °· Drink enough fluids to keep your urine clear or pale yellow.   °· Exercise regularly or as directed by your health care provider.   °· Go to the restroom when you have the urge to go. Do not hold it.   °· Only take over-the-counter or prescription medicines as directed by your health care provider. Do not take other medicines for constipation without talking to your health care provider first.   °SEEK IMMEDIATE MEDICAL CARE IF:  °· You have bright red blood in your stool.   °· Your constipation lasts for more than 4 days or gets worse.   °· You have abdominal or rectal pain.   °· You have thin, pencil-like stools.   °· You have unexplained weight loss. °MAKE SURE YOU:  °· Understand these instructions. °· Will watch your condition. °· Will get help right away if you are not doing well or get worse. °Document Released: 02/22/2004 Document Revised: 05/31/2013 Document Reviewed: 03/07/2013 °ExitCare® Patient Information ©2015 [employer]. This information is not intended to replace advice given to you by your health care provider. Make sure you discuss any questions   you have with your health care provider. Pain Relief Preoperatively and Postoperatively Being a good patient does not mean being a silent one.If you have questions, problems, or concerns about the pain you Feigel feel after surgery, let your caregiver know.Patients have the right to assessment and management of pain. The treatment of pain after surgery  is important to speed up recovery and return to normal activities. Severe pain after surgery, and the fear or anxiety associated with that pain, Korver cause extreme discomfort that:  Prevents sleep.  Decreases the ability to breathe deeply and cough. This can cause pneumonia or other upper airway infections.  Causes your heart to beat faster and your blood pressure to be higher.  Increases the risk for constipation and bloating.  Decreases the ability of wounds to heal.  Darley result in depression, increased anxiety, and feelings of helplessness. Relief of pain before surgery is also important because it will lessen the pain after surgery. Patients who receive both pain relief before and after surgery experience greater pain relief than those who only receive pain relief after surgery. Let your caregiver know if you are having uncontrolled pain.This is very important.Pain after surgery is more difficult to manage if it is permitted to become severe, so prompt and adequate treatment of acute pain is necessary. PAIN CONTROL METHODS Your caregivers follow policies and procedures about the management of patient pain.These guidelines should be explained to you before surgery.Plans for pain control after surgery must be mutually decided upon and instituted with your full understanding and agreement.Do not be afraid to ask questions regarding the care you are receiving.There are many different ways your caregivers will attempt to control your pain, including the following methods. As needed pain control  You Barsanti be given pain medicine either through your intravenous (IV) tube, or as a pill or liquid you can swallow. You will need to let your caregiver know when you are having pain. Then, your caregiver will give you the pain medicine ordered for you.  Your pain medicine Folden make you constipated. If constipation occurs, drink more liquids if you can. Your caregiver Barbaro have you take a mild  laxative. IV patient-controlled analgesia pump (PCA pump)  You can get your pain medicine through the IV tube which goes into your vein. You are able to control the amount of pain medicine that you get. The pain medicine flows in through an IV tube and is controlled by a pump. This pump gives you a set amount of pain medicine when you push the button hooked up to it. Nobody should push this button but you or someone specifically assigned by you to do so. It is set up to keep you from accidentally giving yourself too much pain medicine. You will be able to start using your pain pump in the recovery room after your surgery. This method can be helpful for most types of surgery.  If you are still having too much pain, tell your caregiver. Also, tell your caregiver if you are feeling too sleepy or nauseous. Continuous epidural pain control  A thin, soft tube (catheter) is put into your back. Pain medicine flows through the catheter to lessen pain in the part of your body where the surgery is done. Continuous epidural pain control Rahe work best for you if you are having surgery on your chest, abdomen, hip area, or legs. The epidural catheter is usually put into your back just before surgery. The catheter is left in until you can eat  and take medicine by mouth. In most cases, this Santore take 2 to 3 days.  Giving pain medicine through the epidural catheter Gilpatrick help you heal faster because:  Your bowel gets back to normal faster.  You can get back to eating sooner.  You can be up and walking sooner. Medicine that numbs the area (local anesthetic)  You Dechaine receive an injection of pain medicine near where the pain is (local infiltration).  You Keach receive an injection of pain medicine near the nerve that controls the sensation to a specific part of the body (peripheral nerve block).  Medicine Martha be put in the spine to block pain (spinal block). Opioids  Moderate to moderately severe acute pain after  surgery Kurt respond to opioids.Opioids are narcotic pain medicine. Opioids are often combined with non-narcotic medicines to improve pain relief, diminish the risk of side effects, and reduce the chance of addiction.  If you follow your caregiver's directions about taking opioids and you do not have a history of substance abuse, your risk of becoming addicted is exceptionally small.Opioids are given for short periods of time in careful doses to prevent addiction. Other methods of pain control include:  Steroids.  Physical therapy.  Heat and cold therapy.  Compression, such as wrapping an elastic bandage around the area of pain.  Massage. These various ways of controlling pain Platte be used together. Combining different methods of pain control is called multimodal analgesia. Using this approach has many benefits, including being able to eat, move around, and leave the hospital sooner. Document Released: 08/16/2002 Document Revised: 08/18/2011 Document Reviewed: 08/20/2010 [facility] Patient Information 2015 [place], [place]. This information is not intended to replace advice given to you by your health care provider. Make sure you discuss any questions you have with your health care provider.

## 2015-02-05 NOTE — ED Notes (Signed)
Pt presents with LLQ pain, 3 days post-op lap cholycystectomy. Pt rates pain as 8.5/10.

## 2015-02-05 NOTE — Anesthesia Postprocedure Evaluation (Signed)
  Anesthesia Post-op Note  Patient: [name]  Procedure(s) Performed: Procedure(s): LAPAROSCOPIC CHOLECYSTECTOMY WITH INTRAOPERATIVE CHOLANGIOGRAM (N/A)  Anesthesia type:General  Patient location: PACU  Post pain: Pain level controlled  Post assessment: Post-op Vital signs reviewed, Patient's Cardiovascular Status Stable, Respiratory Function Stable, Patent Airway and No signs of Nausea or vomiting  Post vital signs: Reviewed and stable  Last Vitals:  Filed Vitals:   02/01/15 1741  BP: 108/68  Pulse: 72  Temp:   Resp: 16    Level of consciousness: awake, alert  and patient cooperative  Complications: No apparent anesthesia complications

## 2015-02-08 LAB — POCT PREGNANCY, URINE: Preg Test, Ur: NEGATIVE

## 2015-02-13 ENCOUNTER — Encounter: Payer: Self-pay | Admitting: General Surgery

## 2015-02-13 ENCOUNTER — Ambulatory Visit (INDEPENDENT_AMBULATORY_CARE_PROVIDER_SITE_OTHER): Payer: Medicaid Other | Admitting: General Surgery

## 2015-02-13 VITALS — BP 115/68 | HR 72 | Resp 12 | Ht 61.0 in | Wt 125.0 lb

## 2015-02-13 DIAGNOSIS — K8020 Calculus of gallbladder without cholecystitis without obstruction: Secondary | ICD-10-CM

## 2015-02-13 NOTE — Patient Instructions (Signed)
Continue using heating pad and ibuprofen.

## 2015-02-13 NOTE — Progress Notes (Signed)
Patient ID: [name], female   DOB: 1996/02/21, 19 y.o.   MRN: [id number]  No chief complaint on file.   HPI [name] is a 19 y.o. female here today for her post op cholecystectomy follow up. She had the cholecystectomy done on 02/01/15, she reports going to the ER at [facility] on 02/05/15 for abdominal pain. She had constipation and she states she took miralax and had bowel movement and felt better. She reports she is eating and moving bowels regularly. She is having occasional shoots of pain in her belly button and near her upper epigastric region. She is taking ibuprofen and she says it has not helped.  HPI  Past Medical History  Diagnosis Date  . Anxiety   . Anemia   . Bladder infection, acute 01-10-2015    completed antibiotics and denies any s&s of infection (01-30-15)  . Headache     MIGRAINES DAILY  . GERD (gastroesophageal reflux disease)     NO MEDS  . Dysrhythmia     occ irregular heart beat per pt    Past Surgical History  Procedure Laterality Date  . Tonsillectomy    . Adenoidectomy    . Wisdom tooth extraction    . Cholecystectomy N/A 02/01/2015    Procedure: LAPAROSCOPIC CHOLECYSTECTOMY WITH INTRAOPERATIVE CHOLANGIOGRAM;  Surgeon: Earline Mayotte, MD;  Location: [facility] ORS;  Service: General;  Laterality: N/A;    Family History  Problem Relation Age of Onset  . Cancer Maternal Aunt     breast    Social History Social History  Substance Use Topics  . Smoking status: Former Smoker -- 0.50 packs/day for 8 years    Types: Cigarettes    Quit date: 11/08/2014  . Smokeless tobacco: Never Used  . Alcohol Use: 0.0 oz/week    0 Standard drinks or equivalent per week     Comment: seldom    Allergies  Allergen Reactions  . Penicillins Swelling and Anaphylaxis  . Amoxicillin     Current Outpatient Prescriptions  Medication Sig Dispense Refill  . acetaminophen (TYLENOL) 325 MG tablet Take by mouth.    . hydrOXYzine (ATARAX/VISTARIL) 25 MG tablet Take  25 mg by mouth every 6 (six) hours as needed.     Marland Kitchen ibuprofen (ADVIL,MOTRIN) 800 MG tablet Take 1 tablet (800 mg total) by mouth every 8 (eight) hours as needed. 30 tablet 0   No current facility-administered medications for this visit.    Review of Systems Review of Systems  Constitutional: Negative.   Respiratory: Negative.   Cardiovascular: Positive for chest pain.    Blood pressure 115/68, pulse 72, resp. rate 12, height  (1.549 m), weight 125 lb (56.7 kg), last menstrual period 01/15/2015.  Physical Exam Physical Exam  Constitutional: She is oriented to person, place, and time. She appears well-developed.  Eyes: No scleral icterus.  Cardiovascular: Normal rate, regular rhythm and normal heart sounds.   Pulmonary/Chest: Effort normal and breath sounds normal.  Abdominal: Soft. Bowel sounds are normal.  Neurological: She is alert and oriented to person, place, and time.  Skin: Skin is warm and dry.  Psychiatric: Her behavior is normal.    Data Reviewed DIAGNOSIS:  A. GALLBLADDER; LAPAROSCOPIC CHOLECYSTECTOMY:  - CHRONIC CHOLECYSTITIS WITH CHOLELITHIASIS.  - NEGATIVE FOR MALIGNANCY.   Assessment    Abdominal wall soreness status post cholecystectomy. No indication for narcotic analgesia.    Plan    Continued use of the heating pad is encouraged.  PCP: Dr. Winnifred Simmons, Merrily Pew 02/14/2015, 11:36 AM

## 2015-02-22 ENCOUNTER — Encounter: Payer: Self-pay | Admitting: General Surgery

## 2015-02-28 ENCOUNTER — Ambulatory Visit
Admission: RE | Admit: 2015-02-28 | Discharge: 2015-02-28 | Disposition: A | Discharge status: Home / Self Care | Payer: Medicaid Other | Source: Ambulatory Visit | Attending: Family Medicine | Admitting: Family Medicine

## 2015-02-28 ENCOUNTER — Other Ambulatory Visit: Payer: Self-pay | Admitting: Family Medicine

## 2015-02-28 DIAGNOSIS — R0781 Pleurodynia: Secondary | ICD-10-CM | POA: Insufficient documentation

## 2015-03-12 ENCOUNTER — Ambulatory Visit (INDEPENDENT_AMBULATORY_CARE_PROVIDER_SITE_OTHER): Payer: Medicaid Other | Admitting: General Surgery

## 2015-03-12 ENCOUNTER — Encounter: Payer: Self-pay | Admitting: General Surgery

## 2015-03-12 VITALS — BP 100/62 | HR 76 | Resp 12 | Ht 61.0 in | Wt 127.0 lb

## 2015-03-12 DIAGNOSIS — K8020 Calculus of gallbladder without cholecystitis without obstruction: Secondary | ICD-10-CM

## 2015-03-12 DIAGNOSIS — R1033 Periumbilical pain: Secondary | ICD-10-CM

## 2015-03-12 NOTE — Patient Instructions (Signed)
Call with any changes, use aleve to help with tenderness
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
## 2015-08-06 ENCOUNTER — Other Ambulatory Visit: Payer: Self-pay | Admitting: Physician Assistant

## 2015-08-06 ENCOUNTER — Other Ambulatory Visit (HOSPITAL_COMMUNITY): Payer: Self-pay | Admitting: Family Medicine

## 2015-08-06 DIAGNOSIS — R42 Dizziness and giddiness: Secondary | ICD-10-CM

## 2015-08-06 DIAGNOSIS — N941 Unspecified dyspareunia: Secondary | ICD-10-CM

## 2015-08-07 ENCOUNTER — Ambulatory Visit: Payer: Medicaid Other

## 2015-08-07 ENCOUNTER — Ambulatory Visit
Admission: RE | Admit: 2015-08-07 | Discharge: 2015-08-07 | Disposition: A | Discharge status: Home / Self Care | Payer: Medicaid Other | Source: Ambulatory Visit | Attending: Physician Assistant | Admitting: Physician Assistant

## 2015-08-07 DIAGNOSIS — N941 Unspecified dyspareunia: Secondary | ICD-10-CM | POA: Insufficient documentation

## 2015-11-22 ENCOUNTER — Encounter: Payer: Self-pay | Admitting: Urgent Care

## 2015-11-22 ENCOUNTER — Emergency Department: Payer: Medicaid Other

## 2015-11-22 ENCOUNTER — Emergency Department
Admission: EM | Admit: 2015-11-22 | Discharge: 2015-11-22 | Disposition: A | Discharge status: Home / Self Care | Payer: Medicaid Other | Attending: Emergency Medicine | Admitting: Emergency Medicine

## 2015-11-22 DIAGNOSIS — Z79899 Other long term (current) drug therapy: Secondary | ICD-10-CM | POA: Insufficient documentation

## 2015-11-22 DIAGNOSIS — G43011 Migraine without aura, intractable, with status migrainosus: Secondary | ICD-10-CM | POA: Diagnosis not present

## 2015-11-22 DIAGNOSIS — Z8679 Personal history of other diseases of the circulatory system: Secondary | ICD-10-CM | POA: Insufficient documentation

## 2015-11-22 DIAGNOSIS — Z87891 Personal history of nicotine dependence: Secondary | ICD-10-CM | POA: Diagnosis not present

## 2015-11-22 DIAGNOSIS — R51 Headache: Secondary | ICD-10-CM | POA: Diagnosis present

## 2015-11-22 LAB — BASIC METABOLIC PANEL
Anion gap: 8 (ref 5–15)
BUN: 18 mg/dL (ref 6–20)
CHLORIDE: 108 mmol/L (ref 101–111)
CO2: 23 mmol/L (ref 22–32)
CREATININE: 0.75 mg/dL (ref 0.44–1.00)
Calcium: 8.9 mg/dL (ref 8.9–10.3)
GFR calc Af Amer: >60 mL/min (ref >60)
GFR calc non Af Amer: >60 mL/min (ref >60)
Glucose, Bld: 86 mg/dL (ref 65–99)
Potassium: 4 mmol/L (ref 3.5–5.1)
SODIUM: 139 mmol/L (ref 135–145)

## 2015-11-22 LAB — CBC
HCT: 40 % (ref 35.0–47.0)
HEMOGLOBIN: 13 g/dL (ref 12.0–16.0)
MCH: 27.9 pg (ref 26.0–34.0)
MCHC: 32.6 g/dL (ref 32.0–36.0)
MCV: 85.8 fL (ref 80.0–100.0)
Platelets: 164 10*3/uL (ref 150–440)
RBC: 4.67 MIL/uL (ref 3.80–5.20)
RDW: 13.4 % (ref 11.5–14.5)
WBC: 7.9 10*3/uL (ref 3.6–11.0)

## 2015-11-22 LAB — POCT PREGNANCY, URINE: Preg Test, Ur: NEGATIVE

## 2015-11-22 MED ORDER — DIPHENHYDRAMINE HCL 50 MG/ML IJ SOLN
25.0000 mg | Freq: Once | INTRAMUSCULAR | Status: AC
  Administered 2015-11-22: 25 mg via INTRAVENOUS
  Filled 2015-11-22: qty 1

## 2015-11-22 MED ORDER — METOCLOPRAMIDE HCL 5 MG/ML IJ SOLN
10.0000 mg | Freq: Once | INTRAMUSCULAR | Status: AC
  Administered 2015-11-22: 10 mg via INTRAVENOUS
  Filled 2015-11-22: qty 2

## 2015-11-22 MED ORDER — DEXAMETHASONE SODIUM PHOSPHATE 10 MG/ML IJ SOLN
4.0000 mg | Freq: Once | INTRAMUSCULAR | Status: AC
  Administered 2015-11-22: 4 mg via INTRAVENOUS
  Filled 2015-11-22: qty 1

## 2015-11-22 MED ORDER — HYDROMORPHONE HCL 1 MG/ML IJ SOLN
1.0000 mg | Freq: Once | INTRAMUSCULAR | Status: AC
  Administered 2015-11-22: 1 mg via INTRAVENOUS
  Filled 2015-11-22: qty 1

## 2015-11-22 MED ORDER — KETOROLAC TROMETHAMINE 10 MG PO TABS: 10.0000 mg | ORAL_TABLET | Freq: Three times a day (TID) | ORAL | Status: DC | PRN

## 2015-11-22 MED ORDER — METOCLOPRAMIDE HCL 10 MG PO TABS: 10.0000 mg | ORAL_TABLET | Freq: Three times a day (TID) | ORAL | Status: DC | PRN

## 2015-11-22 NOTE — Discharge Instructions (Signed)
You Quakenbush take Toradol or Tylenol for headache, and Reglan for nausea associated with her headache. If you're taking Toradol, please do not take any other NSAID medications such as ibuprofen, Advil, Aleve or Motrin.  Please make an appointment with a neurologist for follow-up for your chronic headaches.  Please return to the emergency department if he develops severe pain, visual changes, speech changes, numbness tingling or weakness, fever, or any other symptoms concerning to you.   Recurrent Migraine Headache A migraine headache is an intense, throbbing pain on one or both sides of your head. Recurrent migraines keep coming back. A migraine can last for 30 minutes to several hours. CAUSES  The exact cause of a migraine headache is not always known. However, a migraine Walmer be caused when nerves in the brain become irritated and release chemicals that cause inflammation. This causes pain. Certain things Brooks also trigger migraines, such as:   Alcohol.  Smoking.  Stress.  Menstruation.  Aged cheeses.  Foods or drinks that contain nitrates, glutamate, aspartame, or tyramine.  Lack of sleep.  Chocolate.  Caffeine.  Hunger.  Physical exertion.  Fatigue.  Medicines used to treat chest pain (nitroglycerine), birth control pills, estrogen, and some blood pressure medicines. SYMPTOMS   Pain on one or both sides of your head.  Pulsating or throbbing pain.  Severe pain that prevents daily activities.  Pain that is aggravated by any physical activity.  Nausea, vomiting, or both.  Dizziness.  Pain with exposure to bright lights, loud noises, or activity.  General sensitivity to bright lights, loud noises, or smells. Before you get a migraine, you Totty get warning signs that a migraine is coming (aura). An aura Engdahl include:  Seeing flashing lights.  Seeing bright spots, halos, or zigzag lines.  Having tunnel vision or blurred vision.  Having feelings of numbness or  tingling.  Having trouble talking.  Having muscle weakness. DIAGNOSIS  A recurrent migraine headache is often diagnosed based on:  Symptoms.  Physical examination.  A CT scan or MRI of your head. These imaging tests cannot diagnose migraines but can help rule out other causes of headaches.  TREATMENT  Medicines Armijo be given for pain and nausea. Medicines can also be given to help prevent recurrent migraines. HOME CARE INSTRUCTIONS  Only take over-the-counter or prescription medicines for pain or discomfort as directed by your health care provider. The use of long-term narcotics is not recommended.  Lie down in a dark, quiet room when you have a migraine.  Keep a journal to find out what Persky trigger your migraine headaches. For example, write down:  What you eat and drink.  How much sleep you get.  Any change to your diet or medicines.  Limit alcohol consumption.  Quit smoking if you smoke.  Get 7-9 hours of sleep, or as recommended by your health care provider.  Limit stress.  Keep lights dim if bright lights bother you and make your migraines worse. SEEK MEDICAL CARE IF:   You do not get relief from the medicines given to you.  You have a recurrence of pain.  You have a fever. SEEK IMMEDIATE MEDICAL CARE IF:  Your migraine becomes severe.  You have a stiff neck.  You have loss of vision.  You have muscular weakness or loss of muscle control.  You start losing your balance or have trouble walking.  You feel faint or pass out.  You have severe symptoms that are different from your first symptoms. MAKE SURE  YOU:   Understand these instructions.  Will watch your condition.  Will get help right away if you are not doing well or get worse.   This information is not intended to replace advice given to you by your health care provider. Make sure you discuss any questions you have with your health care provider.   Document Released: 02/18/2001 Document  Revised: 06/16/2014 Document Reviewed: 01/31/2013 [employer] Interactive Patient Education [employer]

## 2015-11-22 NOTE — ED Provider Notes (Signed)
[facility] Emergency Department Provider Note  ____________________________________________  Time seen: Approximately 8:27 PM  I have reviewed the triage vital signs and the nursing notes.   HISTORY  Chief Complaint Headache    HPI [name] is a 20 y.o. female with a history of daily migraines for over a yearresenting for headache. The patient reports that over the last 2 weeks she has had a progressively worsening frontal headache that is dull. She has sharp pains if she bends over or moves around. Her pain has worsened since she became congested after cleaning out a shop at her home and asked week. She has had associated photophobia, for photophobia and nausea without vomiting. This headache is similar to her previous headaches but is now constant. No blurred or double vision, numbness tingling or weakness, difficulty walking. LMP was last week. She is not on OCPs and is not a smoker. No trauma, tick bites, neck pain or stiff neck. She has tried Tylenol, ibuprofen without improvement. She is followed by her PCP for her recurrent headache, but has never seen a neurologist or had any imaging.    Past Medical History  Diagnosis Date  . Anxiety   . Anemia   . Bladder infection, acute 01-10-2015    completed antibiotics and denies any s&s of infection (01-30-15)  . Headache     MIGRAINES DAILY  . GERD (gastroesophageal reflux disease)     NO MEDS  . Dysrhythmia     occ irregular heart beat per pt    Patient Active Problem List   Diagnosis Date Noted  . Periumbilical abdominal pain 03/12/2015  . Gallstones 01/29/2015    Past Surgical History  Procedure Laterality Date  . Tonsillectomy    . Adenoidectomy    . Wisdom tooth extraction    . Cholecystectomy N/A 02/01/2015    Procedure: LAPAROSCOPIC CHOLECYSTECTOMY WITH INTRAOPERATIVE CHOLANGIOGRAM;  Surgeon: Earline Mayotte, MD;  Location: [facility] ORS;  Service: General;  Laterality: N/A;     Current Outpatient Rx  Name  Route  Sig  Dispense  Refill  . acetaminophen (TYLENOL) 325 MG tablet   Oral   Take 650 mg by mouth every 6 (six) hours as needed.          Marland Kitchen ibuprofen (ADVIL,MOTRIN) 800 MG tablet   Oral   Take 1 tablet (800 mg total) by mouth every 8 (eight) hours as needed. Patient not taking: Reported on 11/22/2015   30 tablet   0   . ketorolac (TORADOL) 10 MG tablet   Oral   Take 1 tablet (10 mg total) by mouth every 8 (eight) hours as needed for moderate pain (with food).   15 tablet   0   . metoCLOPramide (REGLAN) 10 MG tablet   Oral   Take 1 tablet (10 mg total) by mouth every 8 (eight) hours as needed for nausea.   12 tablet   0     Allergies Penicillins and Amoxicillin  Family History  Problem Relation Age of Onset  . Cancer Maternal Aunt     breast    Social History Social History  Substance Use Topics  . Smoking status: Former Smoker -- 0.50 packs/day for 8 years    Types: Cigarettes    Quit date: 11/08/2014  . Smokeless tobacco: Never Used  . Alcohol Use: 0.0 oz/week    0 Standard drinks or equivalent per week     Comment: seldom    Review of Systems Constitutional: No  fever/chills.No lightheadedness or syncope. Eyes: No blurred or double vision. Positive photophobia. ENT: No sore throat. Positive congestion without rhinorrhea. Positive photophobia. Cardiovascular: Denies chest pain. Denies palpitations. Respiratory: Denies shortness of breath.  No cough. Gastrointestinal: No abdominal pain.  No nausea, no vomiting.  No diarrhea.  No constipation. Genitourinary: Negative for dysuria. Musculoskeletal: Negative for back pain. Skin: Negative for rash. Neurological: Positive for headaches. No focal numbness, tingling or weakness.   10-point ROS otherwise negative.  ____________________________________________   PHYSICAL EXAM:  VITAL SIGNS: ED Triage Vitals  Enc Vitals Group     BP --      Pulse Rate 11/22/15 2002 90      Resp 11/22/15 2002 20     Temp 11/22/15 2002 97.7 F (36.5 C)     Temp Source 11/22/15 2002 Oral     SpO2 --      Weight 11/22/15 2002 134 lb (60.782 kg)     Height 11/22/15 2002 5\' 1"  (1.549 m)     Head Cir --      Peak Flow --      Pain Score 11/22/15 2003 10     Pain Loc --      Pain Edu? --      Excl. in GC? --     Constitutional: Alert and oriented.Uncomfortable appearing and tearful but nontoxic. Answers questions appropriately. Eyes: Conjunctivae are normal.  EOMI. No scleral icterus. PERRLA. No nystagmus. No eye discharge. No lacrimation. Head: Atraumatic. Nose: Positive congestion with clear rhinorrhea. Mouth/Throat: Mucous membranes are moist.  Neck: No stridor.  Supple.  No meningismus. Full range of motion without significant pain. Cardiovascular: Normal rate, regular rhythm. No murmurs, rubs or gallops.  Respiratory: Normal respiratory effort.  No accessory muscle use or retractions. Lungs CTAB.  No wheezes, rales or ronchi. Gastrointestinal: Soft, nontender and nondistended.  No guarding or rebound.  No peritoneal signs. Musculoskeletal: No LE edema.  Neurologic:  A&Ox3.  Speech is clear.  Face and smile are symmetric.  EOMI.  PERRLA. Moves all extremities well. Skin:  Skin is warm, dry and intact. No rash noted. Psychiatric: Mood and affect are normal. Speech and behavior are normal.  Normal judgement.  ____________________________________________   LABS (all labs ordered are listed, but only abnormal results are displayed)  Labs Reviewed  CBC  BASIC METABOLIC PANEL  POC URINE PREG, ED  POCT PREGNANCY, URINE   ____________________________________________  EKG  Not indicated ____________________________________________  RADIOLOGY  Ct Head Wo Contrast  11/22/2015  CLINICAL DATA:  Headaches for several weeks, initial encounter EXAM: CT HEAD WITHOUT CONTRAST TECHNIQUE: Contiguous axial images were obtained from the base of the skull through the vertex  without intravenous contrast. COMPARISON:  None. FINDINGS: The bony calvarium is intact. The ventricles are of normal size and configuration. No findings to suggest acute hemorrhage, acute infarction or space-occupying mass lesion are noted. IMPRESSION: No acute intracranial abnormality noted. Electronically Signed   By: Alcide Clever M.D.   On: 11/22/2015 21:39    ____________________________________________   PROCEDURES  Procedure(s) performed: None  Critical Care performed: No ____________________________________________   INITIAL IMPRESSION / ASSESSMENT AND PLAN / ED COURSE  Pertinent labs & imaging results that were available during my care of the patient were reviewed by me and considered in my medical decision making (see chart for details).  20 y.o. female with a history of chronic daily migraines presenting with 2 week history of persistent migraine that she is unable to treat at home. She does  not have any fever or evidence of meningismus on her exam, nor does she have any recent tick bites. She has no history of trauma.  She is not a smoker and does not take oral contraceptives so a venous sinus thrombosis is also unlikely given her daily headaches. However, she is never been imaged for her headaches and now her pain is worse, so we will plan a CT head. I will also initiate immediate symptomatic treatment, and reevaluate evaluate the patient.  ----------------------------------------- 9:53 PM on 11/22/2015 -----------------------------------------  The patient has labs that are within normal limits, and the CT of her head is normal. Her headache has significantly improved since her arrival. I have offered her additional pain medication, and she has deferred this. She prefers to go home and we will have her follow-up with Dr. Malvin JohnsPotter, the neurologist, for further evaluation of chronic headaches. She was sent to return precautions as well as follow-up  instructions.  ____________________________________________  FINAL CLINICAL IMPRESSION(S) / ED DIAGNOSES  Final diagnoses:  Intractable migraine without aura and with status migrainosus      NEW MEDICATIONS STARTED DURING THIS VISIT:  New Prescriptions   KETOROLAC (TORADOL) 10 MG TABLET    Take 1 tablet (10 mg total) by mouth every 8 (eight) hours as needed for moderate pain (with food).   METOCLOPRAMIDE (REGLAN) 10 MG TABLET    Take 1 tablet (10 mg total) by mouth every 8 (eight) hours as needed for nausea.     Rockne MenghiniAnne-Caroline Waleska Buttery, MD 11/22/15 2153

## 2015-11-22 NOTE — ED Notes (Signed)
Patient presents with c/o severe "pounding" in her head x 2 weeks. Patient with nausea and photophobia; wearing sunglasses. Patient reports that when she stands she "just falls back down to her knees". (+) daily headache, but this is worse.

## 2016-04-21 DIAGNOSIS — H5360 Unspecified night blindness: Secondary | ICD-10-CM | POA: Insufficient documentation

## 2016-05-29 ENCOUNTER — Encounter: Payer: Self-pay | Admitting: Emergency Medicine

## 2016-05-29 ENCOUNTER — Emergency Department
Admission: EM | Admit: 2016-05-29 | Discharge: 2016-05-29 | Disposition: A | Discharge status: Home / Self Care | Payer: Medicaid Other | Attending: Emergency Medicine | Admitting: Emergency Medicine

## 2016-05-29 DIAGNOSIS — G43809 Other migraine, not intractable, without status migrainosus: Secondary | ICD-10-CM | POA: Insufficient documentation

## 2016-05-29 DIAGNOSIS — R112 Nausea with vomiting, unspecified: Secondary | ICD-10-CM

## 2016-05-29 DIAGNOSIS — R55 Syncope and collapse: Secondary | ICD-10-CM

## 2016-05-29 DIAGNOSIS — Z87891 Personal history of nicotine dependence: Secondary | ICD-10-CM | POA: Insufficient documentation

## 2016-05-29 DIAGNOSIS — R197 Diarrhea, unspecified: Secondary | ICD-10-CM | POA: Insufficient documentation

## 2016-05-29 DIAGNOSIS — Z79899 Other long term (current) drug therapy: Secondary | ICD-10-CM | POA: Insufficient documentation

## 2016-05-29 LAB — URINALYSIS, COMPLETE (UACMP) WITH MICROSCOPIC
Bacteria, UA: NONE SEEN
Bilirubin Urine: NEGATIVE
GLUCOSE, UA: NEGATIVE mg/dL
HGB URINE DIPSTICK: NEGATIVE
KETONES UR: NEGATIVE mg/dL
NITRITE: NEGATIVE
PH: 9 — AB (ref 5.0–8.0)
PROTEIN: 100 mg/dL — AB
Specific Gravity, Urine: 1.021 (ref 1.005–1.030)

## 2016-05-29 LAB — BASIC METABOLIC PANEL
ANION GAP: 8 (ref 5–15)
BUN: 15 mg/dL (ref 6–20)
CALCIUM: 8.7 mg/dL — AB (ref 8.9–10.3)
CO2: 25 mmol/L (ref 22–32)
CREATININE: 0.79 mg/dL (ref 0.44–1.00)
Chloride: 107 mmol/L (ref 101–111)
Glucose, Bld: 136 mg/dL — ABNORMAL HIGH (ref 65–99)
Potassium: 3.4 mmol/L — ABNORMAL LOW (ref 3.5–5.1)
SODIUM: 140 mmol/L (ref 135–145)

## 2016-05-29 LAB — CBC
HCT: 40.5 % (ref 35.0–47.0)
HEMOGLOBIN: 13.2 g/dL (ref 12.0–16.0)
MCH: 27.9 pg (ref 26.0–34.0)
MCHC: 32.7 g/dL (ref 32.0–36.0)
MCV: 85.4 fL (ref 80.0–100.0)
PLATELETS: 207 10*3/uL (ref 150–440)
RBC: 4.75 MIL/uL (ref 3.80–5.20)
RDW: 12.8 % (ref 11.5–14.5)
WBC: 13.7 10*3/uL — AB (ref 3.6–11.0)

## 2016-05-29 LAB — HEPATIC FUNCTION PANEL
ALK PHOS: 54 U/L (ref 38–126)
ALT: 13 U/L — AB (ref 14–54)
AST: 21 U/L (ref 15–41)
Albumin: 3.9 g/dL (ref 3.5–5.0)
BILIRUBIN TOTAL: 0.2 mg/dL — AB (ref 0.3–1.2)
Bilirubin, Direct: <0.1 mg/dL — ABNORMAL LOW (ref 0.1–0.5)
TOTAL PROTEIN: 6.4 g/dL — AB (ref 6.5–8.1)

## 2016-05-29 LAB — TROPONIN I: Troponin I: <0.03 ng/mL (ref <0.03)

## 2016-05-29 LAB — POCT PREGNANCY, URINE: Preg Test, Ur: NEGATIVE

## 2016-05-29 LAB — LIPASE, BLOOD: LIPASE: 21 U/L (ref 11–51)

## 2016-05-29 MED ORDER — DIPHENHYDRAMINE HCL 50 MG/ML IJ SOLN
12.5000 mg | Freq: Once | INTRAMUSCULAR | Status: AC
  Administered 2016-05-29: 12.5 mg via INTRAVENOUS
  Filled 2016-05-29: qty 1

## 2016-05-29 MED ORDER — PROCHLORPERAZINE MALEATE 10 MG PO TABS: 10.0000 mg | ORAL_TABLET | Freq: Four times a day (QID) | ORAL | 0 refills | Status: DC | PRN

## 2016-05-29 MED ORDER — SODIUM CHLORIDE 0.9 % IV BOLUS (SEPSIS)
1000.0000 mL | Freq: Once | INTRAVENOUS | Status: AC
  Administered 2016-05-29: 1000 mL via INTRAVENOUS

## 2016-05-29 MED ORDER — PROCHLORPERAZINE EDISYLATE 5 MG/ML IJ SOLN
5.0000 mg | Freq: Once | INTRAMUSCULAR | Status: AC
  Administered 2016-05-29: 5 mg via INTRAVENOUS
  Filled 2016-05-29: qty 2

## 2016-05-29 MED ORDER — ONDANSETRON 4 MG PO TBDP: 4.0000 mg | ORAL_TABLET | Freq: Three times a day (TID) | ORAL | 0 refills | Status: DC | PRN

## 2016-05-29 MED ORDER — ONDANSETRON HCL 4 MG/2ML IJ SOLN
4.0000 mg | Freq: Once | INTRAMUSCULAR | Status: AC
  Administered 2016-05-29: 4 mg via INTRAVENOUS
  Filled 2016-05-29: qty 2

## 2016-05-29 NOTE — ED Provider Notes (Signed)
[facility] Emergency Department Provider Note   ____________________________________________   First MD Initiated Contact with Patient 05/29/16 0505     (approximate)  I have reviewed the triage vital signs and the nursing notes.   HISTORY  Chief Complaint Loss of Consciousness and Headache    HPI [name] is a 20 y.o. female who presents to the ED from home with a chief complaint of nausea, vomiting, diarrhea, migraine headache and syncope. Patient has a history of recurrent migraine headaches who has had a one-day history of nausea, vomiting and diarrhea. Reports she woke up to use the restroom and passed out. Reports headache behind her eyes which is typical of her migraine headaches. Reports when she came to, her right arm was numb. Numbness has now resolved. Denies associated vision changes, neck pain, chest pain, shortness of breath, abdominal pain, dysuria. Denies recent travel or trauma. Nothing makes her symptoms better or worse.   Past Medical History:  Diagnosis Date  . Anemia   . Anxiety   . Bladder infection, acute 01-10-2015   completed antibiotics and denies any s&s of infection (01-30-15)  . Dysrhythmia    occ irregular heart beat per pt  . GERD (gastroesophageal reflux disease)    NO MEDS  . Headache    MIGRAINES DAILY    Patient Active Problem List   Diagnosis Date Noted  . Periumbilical abdominal pain 03/12/2015  . Gallstones 01/29/2015    Past Surgical History:  Procedure Laterality Date  . ADENOIDECTOMY    . CHOLECYSTECTOMY N/A 02/01/2015   Procedure: LAPAROSCOPIC CHOLECYSTECTOMY WITH INTRAOPERATIVE CHOLANGIOGRAM;  Surgeon: Earline MayotteJeffrey W Byrnett, MD;  Location: [facility] ORS;  Service: General;  Laterality: N/A;  . TONSILLECTOMY    . WISDOM TOOTH EXTRACTION      Prior to Admission medications   Medication Sig Start Date End Date Taking? Authorizing Provider  acetaminophen (TYLENOL) 325 MG tablet Take 650 mg by mouth every 6  (six) hours as needed.     Historical Provider, MD  ibuprofen (ADVIL,MOTRIN) 800 MG tablet Take 1 tablet (800 mg total) by mouth every 8 (eight) hours as needed. Patient not taking: Reported on 11/22/2015 02/05/15   Emily FilbertJonathan E Williams, MD  ketorolac (TORADOL) 10 MG tablet Take 1 tablet (10 mg total) by mouth every 8 (eight) hours as needed for moderate pain (with food). 11/22/15   Anne-Caroline Sharma CovertNorman, MD  metoCLOPramide (REGLAN) 10 MG tablet Take 1 tablet (10 mg total) by mouth every 8 (eight) hours as needed for nausea. 11/22/15 11/21/16  Rockne MenghiniAnne-Caroline Norman, MD    Allergies Penicillins and Amoxicillin  Family History  Problem Relation Age of Onset  . Cancer Maternal Aunt     breast    Social History Social History  Substance Use Topics  . Smoking status: Former Smoker    Packs/day: 0.50    Years: 8.00    Types: Cigarettes    Quit date: 11/08/2014  . Smokeless tobacco: Never Used  . Alcohol use 0.0 oz/week     Comment: seldom    Review of Systems  Constitutional: No fever/chills. Eyes: No visual changes. ENT: No sore throat. Cardiovascular: Denies chest pain. Respiratory: Denies shortness of breath. Gastrointestinal: No abdominal pain.  Positive for nausea, vomiting and diarrhea.  No constipation. Genitourinary: Negative for dysuria. Musculoskeletal: Negative for back pain. Skin: Negative for rash. Neurological: Positive for headache. Negative for focal weakness or numbness.  10-point ROS otherwise negative.  ____________________________________________   PHYSICAL EXAM:  VITAL SIGNS:  ED Triage Vitals  Enc Vitals Group     BP 05/29/16 0120 116/61     Pulse Rate 05/29/16 0120 91     Resp 05/29/16 0120 18     Temp 05/29/16 0120 97.4 F (36.3 C)     Temp Source 05/29/16 0120 Oral     SpO2 05/29/16 0120 97 %     Weight 05/29/16 0120 130 lb (59 kg)     Height 05/29/16 0120 5\' 1"  (1.549 m)     Head Circumference --      Peak Flow --      Pain Score 05/29/16 0121  10     Pain Loc --      Pain Edu? --      Excl. in GC? --     Constitutional: Alert and oriented. Well appearing and in no acute distress. Eyes: Conjunctivae are normal. PERRL. EOMI. Head: Atraumatic. Nose: No congestion/rhinnorhea. Mouth/Throat: Mucous membranes are moist.  Oropharynx non-erythematous. Neck: No stridor.  No cervical spine tenderness to palpation. Cardiovascular: Normal rate, regular rhythm. Grossly normal heart sounds.  Good peripheral circulation. Respiratory: Normal respiratory effort.  No retractions. Lungs CTAB. Gastrointestinal: Soft and nontender to light or deep palpation. No distention. No abdominal bruits. No CVA tenderness. Musculoskeletal: No lower extremity tenderness nor edema.  No joint effusions. Neurologic:  Normal speech and language. No gross focal neurologic deficits are appreciated.  Skin:  Skin is warm, dry and intact. No rash noted. Psychiatric: Mood and affect are normal. Speech and behavior are normal.  ____________________________________________   LABS (all labs ordered are listed, but only abnormal results are displayed)  Labs Reviewed  BASIC METABOLIC PANEL - Abnormal; Notable for the following:       Result Value   Potassium 3.4 (*)    Glucose, Bld 136 (*)    Calcium 8.7 (*)    All other components within normal limits  CBC - Abnormal; Notable for the following:    WBC 13.7 (*)    All other components within normal limits  URINALYSIS, COMPLETE (UACMP) WITH MICROSCOPIC - Abnormal; Notable for the following:    Color, Urine YELLOW (*)    APPearance CLEAR (*)    pH 9.0 (*)    Protein, ur 100 (*)    Leukocytes, UA TRACE (*)    Squamous Epithelial / LPF 6-30 (*)    All other components within normal limits  HEPATIC FUNCTION PANEL - Abnormal; Notable for the following:    Total Protein 6.4 (*)    ALT 13 (*)    Total Bilirubin 0.2 (*)    Bilirubin, Direct <0.1 (*)    All other components within normal limits  TROPONIN I    LIPASE, BLOOD  POC URINE PREG, ED  POCT PREGNANCY, URINE   ____________________________________________  EKG  ED ECG REPORT I, SUNG,JADE J, the attending physician, personally viewed and interpreted this ECG.   Date: 05/29/2016  EKG Time: 0127  Rate: 88  Rhythm: normal EKG, normal sinus rhythm  Axis: Normal  Intervals:none  ST&T Change: Nonspecific  ____________________________________________  RADIOLOGY  None ____________________________________________   PROCEDURES  Procedure(s) performed: None  Procedures  Critical Care performed: No  ____________________________________________   INITIAL IMPRESSION / ASSESSMENT AND PLAN / ED COURSE  Pertinent labs & imaging results that were available during my care of the patient were reviewed by me and considered in my medical decision making (see chart for details).  20 year old female who presents with syncopal episode after a one-day history  of nausea, vomiting and diarrhea. Also complains of recurrent migraine headache. We'll obtain orthostatic vital signs, initiate IV fluid resuscitation, headache cocktail and reassess.  Clinical Course as of May 30 723  Thu May 29, 2016  0720 Patient feeling much better and eager for discharge. Will provide prescriptions for Compazine and Zofran to use as needed. Strict return precautions given. Patient verbalizes understanding and agrees with plan of care.  [JS]    Clinical Course User Index [JS] Irean HongJade J Sung, MD     ____________________________________________   FINAL CLINICAL IMPRESSION(S) / ED DIAGNOSES  Final diagnoses:  Syncope, unspecified syncope type  Nausea vomiting and diarrhea  Other migraine without status migrainosus, not intractable      NEW MEDICATIONS STARTED DURING THIS VISIT:  New Prescriptions   No medications on file     Note:  This document was prepared using Dragon voice recognition software and Gargan include unintentional dictation  errors.    Irean HongJade J Sung, MD 05/29/16 [phone]

## 2016-05-29 NOTE — ED Notes (Signed)
Pt updated on reason for delay. Pt understanding to reasoning. Pt given warm blanket. No other needs expressed.

## 2016-05-29 NOTE — ED Notes (Signed)
Patient unable to provide urine specimen at this time.

## 2016-05-29 NOTE — Discharge Instructions (Signed)
1. You Hendrie take medicines as needed for headache and nausea (Compazine/Zofran #20). 2. Clear liquids 12 hours, then BRAT diet 3 days, then slowly advance diet as tolerated. 3. Return to the ER for worsening symptoms, persistent vomiting, difficulty breathing or other concerns.

## 2016-05-29 NOTE — ED Triage Notes (Addendum)
Pt to triage via w/c with no distress noted; pt reports "I woke up, passed out, my arm went numb and now I have a headache behind my eyes"; pt reports hx of HA

## 2016-06-13 ENCOUNTER — Emergency Department
Admission: EM | Admit: 2016-06-13 | Discharge: 2016-06-13 | Disposition: A | Discharge status: Home / Self Care | Payer: BLUE CROSS/BLUE SHIELD | Attending: Emergency Medicine | Admitting: Emergency Medicine

## 2016-06-13 ENCOUNTER — Encounter: Payer: Self-pay | Admitting: Emergency Medicine

## 2016-06-13 DIAGNOSIS — Z87891 Personal history of nicotine dependence: Secondary | ICD-10-CM | POA: Diagnosis not present

## 2016-06-13 DIAGNOSIS — Z79899 Other long term (current) drug therapy: Secondary | ICD-10-CM | POA: Diagnosis not present

## 2016-06-13 DIAGNOSIS — F410 Panic disorder [episodic paroxysmal anxiety] without agoraphobia: Secondary | ICD-10-CM | POA: Insufficient documentation

## 2016-06-13 DIAGNOSIS — R531 Weakness: Secondary | ICD-10-CM

## 2016-06-13 DIAGNOSIS — Z791 Long term (current) use of non-steroidal anti-inflammatories (NSAID): Secondary | ICD-10-CM | POA: Diagnosis not present

## 2016-06-13 LAB — COMPREHENSIVE METABOLIC PANEL
ALT: 14 U/L (ref 14–54)
AST: 20 U/L (ref 15–41)
Albumin: 4.1 g/dL (ref 3.5–5.0)
Alkaline Phosphatase: 56 U/L (ref 38–126)
Anion gap: 5 (ref 5–15)
BUN: 12 mg/dL (ref 6–20)
CHLORIDE: 107 mmol/L (ref 101–111)
CO2: 25 mmol/L (ref 22–32)
Calcium: 8.9 mg/dL (ref 8.9–10.3)
Creatinine, Ser: 0.86 mg/dL (ref 0.44–1.00)
Glucose, Bld: 93 mg/dL (ref 65–99)
POTASSIUM: 3.6 mmol/L (ref 3.5–5.1)
Sodium: 137 mmol/L (ref 135–145)
Total Bilirubin: 1.1 mg/dL (ref 0.3–1.2)
Total Protein: 7 g/dL (ref 6.5–8.1)

## 2016-06-13 LAB — CBC WITH DIFFERENTIAL/PLATELET
BASOS ABS: 0 10*3/uL (ref 0–0.1)
Basophils Relative: 1 %
Eosinophils Absolute: 0.1 10*3/uL (ref 0–0.7)
Eosinophils Relative: 1 %
HCT: 41.1 % (ref 35.0–47.0)
Hemoglobin: 13.5 g/dL (ref 12.0–16.0)
LYMPHS ABS: 1.4 10*3/uL (ref 1.0–3.6)
LYMPHS PCT: 21 %
MCH: 28 pg (ref 26.0–34.0)
MCHC: 32.8 g/dL (ref 32.0–36.0)
MCV: 85.4 fL (ref 80.0–100.0)
MONO ABS: 0.6 10*3/uL (ref 0.2–0.9)
Monocytes Relative: 10 %
Neutro Abs: 4.5 10*3/uL (ref 1.4–6.5)
Neutrophils Relative %: 67 %
PLATELETS: 184 10*3/uL (ref 150–440)
RBC: 4.81 MIL/uL (ref 3.80–5.20)
RDW: 13.2 % (ref 11.5–14.5)
WBC: 6.7 10*3/uL (ref 3.6–11.0)

## 2016-06-13 LAB — POCT PREGNANCY, URINE: PREG TEST UR: NEGATIVE

## 2016-06-13 MED ORDER — LORAZEPAM 0.5 MG PO TABS
0.5000 mg | ORAL_TABLET | Freq: Once | ORAL | Status: AC
  Administered 2016-06-13: 0.5 mg via ORAL
  Filled 2016-06-13: qty 1

## 2016-06-13 MED ORDER — LORAZEPAM 0.5 MG PO TABS: 0.5000 mg | ORAL_TABLET | Freq: Three times a day (TID) | ORAL | 0 refills | Status: AC | PRN

## 2016-06-13 NOTE — ED Triage Notes (Signed)
Pt here with increasing weakness over the past week. States she has been treated for a Vit D deficiency, has been unable to eat or drink much over the week as well. Denies pain.

## 2016-06-13 NOTE — ED Provider Notes (Signed)
[facility] Emergency Department Provider Note        Time seen: ----------------------------------------- 8:37 AM on 06/13/2016 -----------------------------------------    I have reviewed the triage vital signs and the nursing notes.   HISTORY  Chief Complaint Weakness    HPI [name] is a 21 y.o. female who presents to the ER for increasing weakness over the past week. Patient states been treated for vitamin D deficiency and has had very poor appetite for the last week. She describes decreased oral intake, denies fevers, chills, chest pain, shortness of breath, vomiting or diarrhea. Patient states occasionally she has palpitations. She does report increased stress.   Past Medical History:  Diagnosis Date  . Anemia   . Anxiety   . Bladder infection, acute 01-10-2015   completed antibiotics and denies any s&s of infection (01-30-15)  . Dysrhythmia    occ irregular heart beat per pt  . GERD (gastroesophageal reflux disease)    NO MEDS  . Headache    MIGRAINES DAILY    Patient Active Problem List   Diagnosis Date Noted  . Periumbilical abdominal pain 03/12/2015  . Gallstones 01/29/2015    Past Surgical History:  Procedure Laterality Date  . ADENOIDECTOMY    . CHOLECYSTECTOMY N/A 02/01/2015   Procedure: LAPAROSCOPIC CHOLECYSTECTOMY WITH INTRAOPERATIVE CHOLANGIOGRAM;  Surgeon: Earline MayotteJeffrey W Byrnett, MD;  Location: [facility] ORS;  Service: General;  Laterality: N/A;  . TONSILLECTOMY    . WISDOM TOOTH EXTRACTION      Allergies Penicillins and Amoxicillin  Social History Social History  Substance Use Topics  . Smoking status: Former Smoker    Packs/day: 0.50    Years: 8.00    Types: Cigarettes    Quit date: 11/08/2014  . Smokeless tobacco: Never Used  . Alcohol use 0.0 oz/week     Comment: seldom    Review of Systems Constitutional: Negative for fever. Cardiovascular: Negative for chest pain. Respiratory: Negative for shortness of  breath. Gastrointestinal: Negative for abdominal pain, vomiting and diarrhea. Genitourinary: Negative for dysuria. Musculoskeletal: Negative for back pain. Skin: Negative for rash. Neurological: Negative for headaches, Positive for generalized weakness  10-point ROS otherwise negative.  ____________________________________________   PHYSICAL EXAM:  VITAL SIGNS: ED Triage Vitals  Enc Vitals Group     BP 06/13/16 0812 130/86     Pulse Rate 06/13/16 0812 94     Resp 06/13/16 0812 18     Temp 06/13/16 0810 98.2 F (36.8 C)     Temp Source 06/13/16 0810 Oral     SpO2 06/13/16 0812 98 %     Weight 06/13/16 0812 135 lb (61.2 kg)     Height 06/13/16 0812 5\' 1"  (1.549 m)     Head Circumference --      Peak Flow --      Pain Score --      Pain Loc --      Pain Edu? --      Excl. in GC? --    Constitutional: Alert and oriented. Well appearing and in no distress.Anxious mood and affect Eyes: Conjunctivae are normal. PERRL. Normal extraocular movements. ENT   Head: Normocephalic and atraumatic.   Nose: No congestion/rhinnorhea.   Mouth/Throat: Mucous membranes are moist.   Neck: No stridor. Cardiovascular: Normal rate, regular rhythm. No murmurs, rubs, or gallops. Respiratory: Normal respiratory effort without tachypnea nor retractions. Breath sounds are clear and equal bilaterally. No wheezes/rales/rhonchi. Gastrointestinal: Soft and nontender. Normal bowel sounds Musculoskeletal: Nontender with normal range of  motion in all extremities. No lower extremity tenderness nor edema. Neurologic:  Normal speech and language. No gross focal neurologic deficits are appreciated.  Skin:  Skin is warm, dry and intact. No rash noted. Psychiatric: Speech and behavior are normal. Tears easily ____________________________________________  ED COURSE:  Pertinent labs & imaging results that were available during my care of the patient were reviewed by me and considered in my medical  decision making (see chart for details). Clinical Course   Patient is no acute distress, likely anxiety or depression related symptoms. We will assess with labs and give oral Ativan.  Procedures ____________________________________________   LABS (pertinent positives/negatives)  Labs Reviewed  CBC WITH DIFFERENTIAL/PLATELET  COMPREHENSIVE METABOLIC PANEL  POC URINE PREG, ED  POCT PREGNANCY, URINE   ____________________________________________  FINAL ASSESSMENT AND PLAN  Anxiety  Plan: Patient with labs as dictated above. Patient presents to ER with symptoms of anxiety and occasional panic attacks. Will prescribe Ativan for her to take as needed. She is stable for outpatient follow-up.   Emily Filbert, MD   Note: This dictation was prepared with Dragon dictation. Any transcriptional errors that result from this process are unintentional    Emily Filbert, MD 06/13/16 1000

## 2016-07-08 DIAGNOSIS — E559 Vitamin D deficiency, unspecified: Secondary | ICD-10-CM | POA: Insufficient documentation

## 2017-03-19 IMAGING — CR DG CHOLANGIOGRAM OPERATIVE
1 series · 10 of 10 positions shown · non-contrast
Comparison: Ultrasound 01/19/2015.

CLINICAL DATA: Gallstones.

EXAM:
INTRAOPERATIVE CHOLANGIOGRAM
TECHNIQUE: Cholangiographic images from the C-arm fluoroscopic device were
submitted for interpretation post-operatively. Please see the
procedural report for the amount of contrast and the fluoroscopy
time utilized.

[Series 5: cont. · 10 of 19 frames shown]
[frame 1/19]
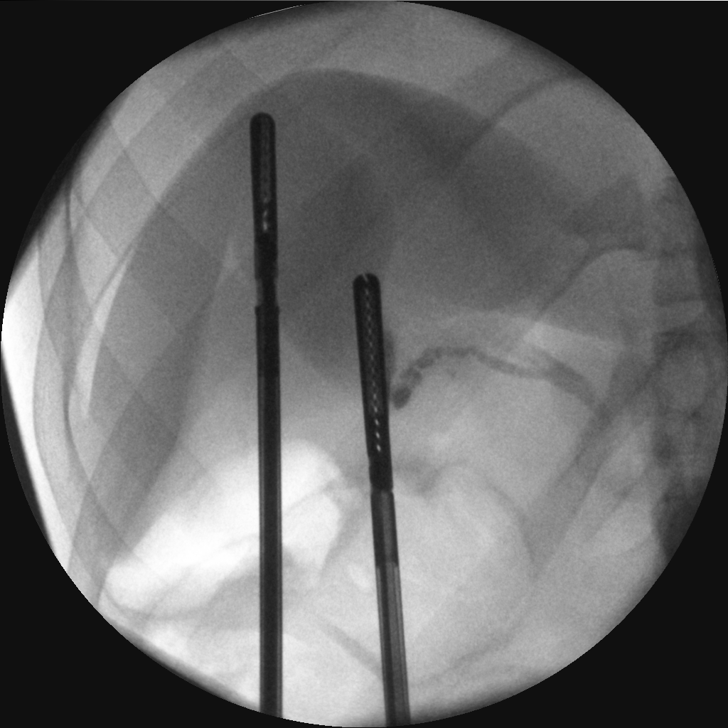
[frame 3/19]
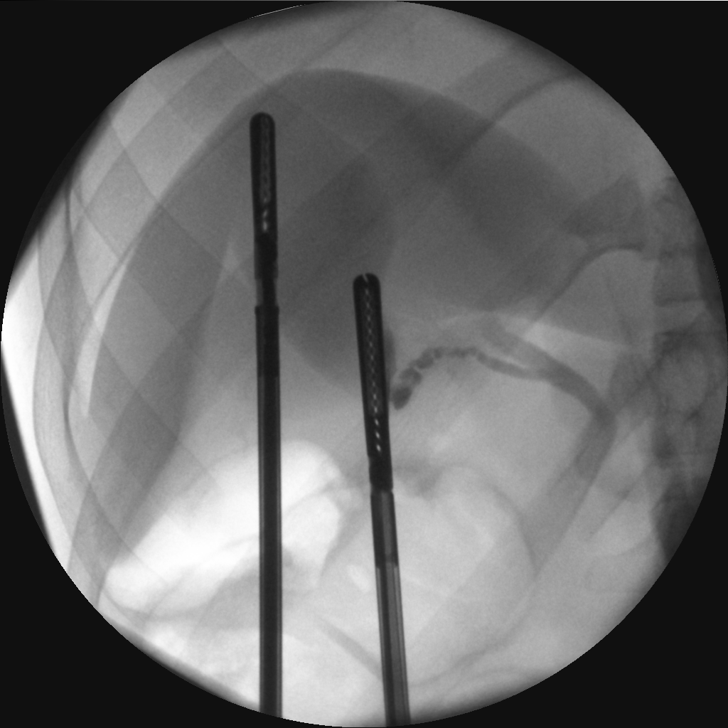
[frame 5/19]
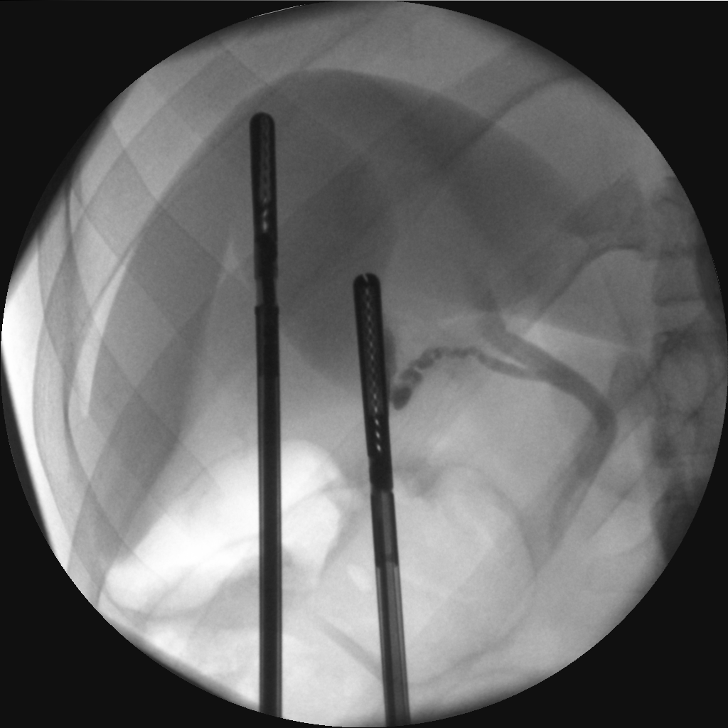
[frame 7/19]
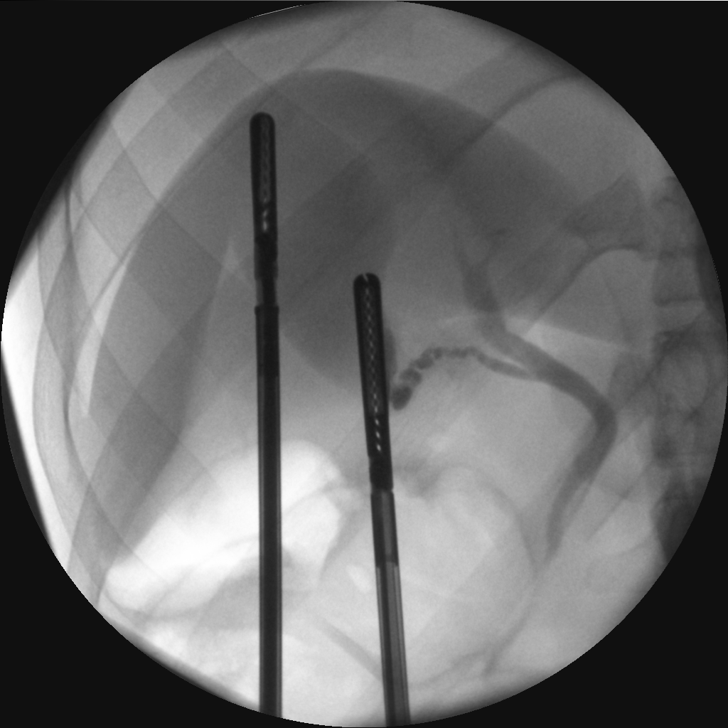
[frame 9/19]
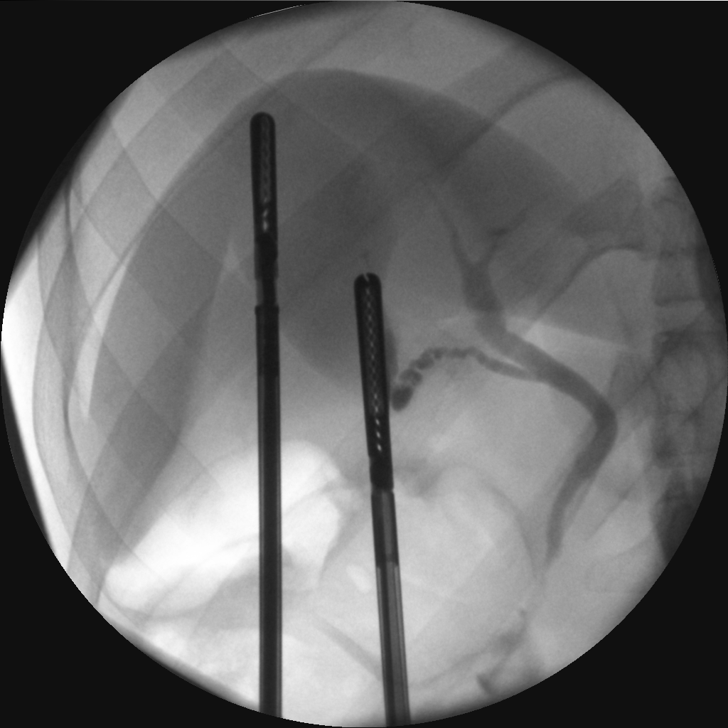
[frame 11/19]
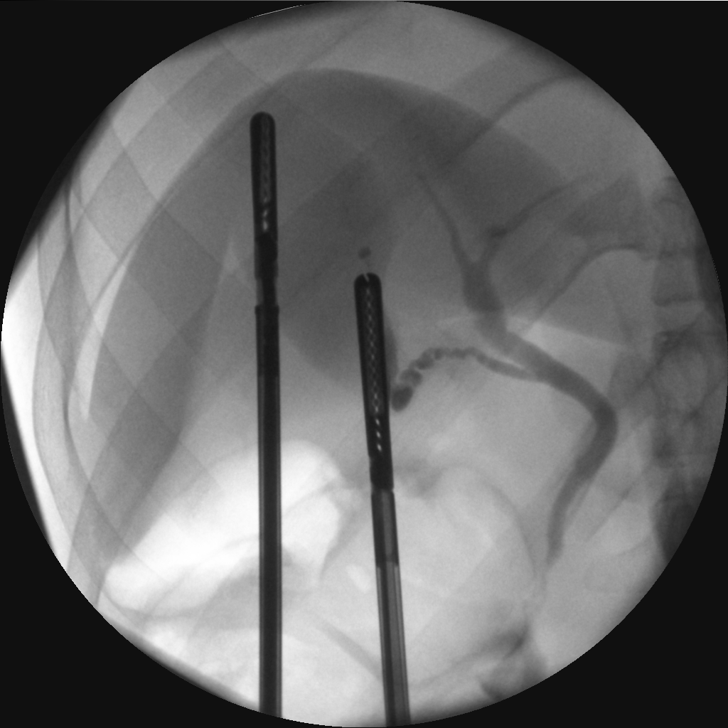
[frame 13/19]
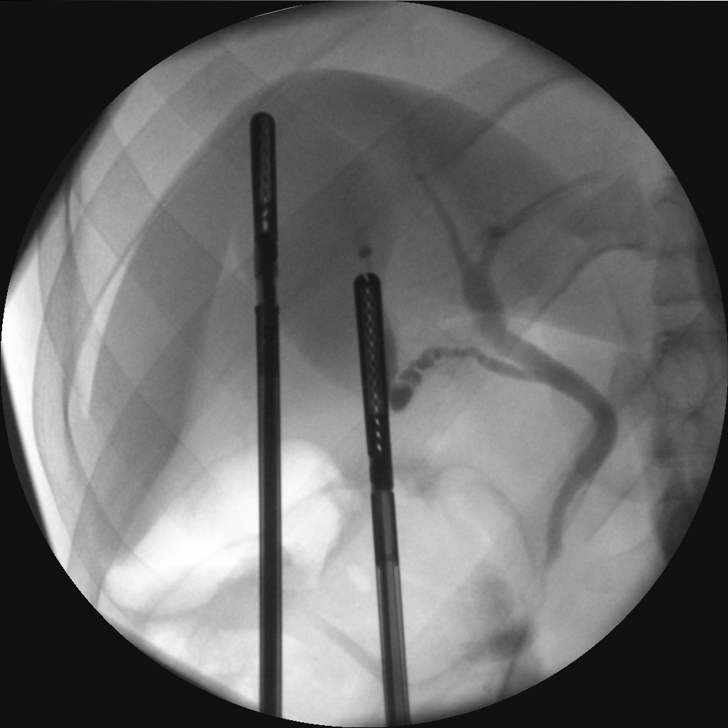
[frame 15/19]
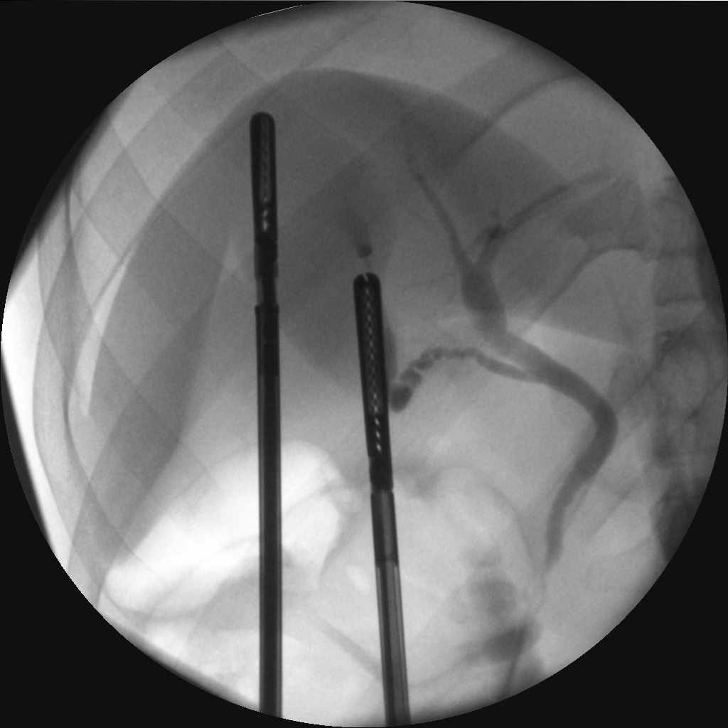
[frame 17/19]
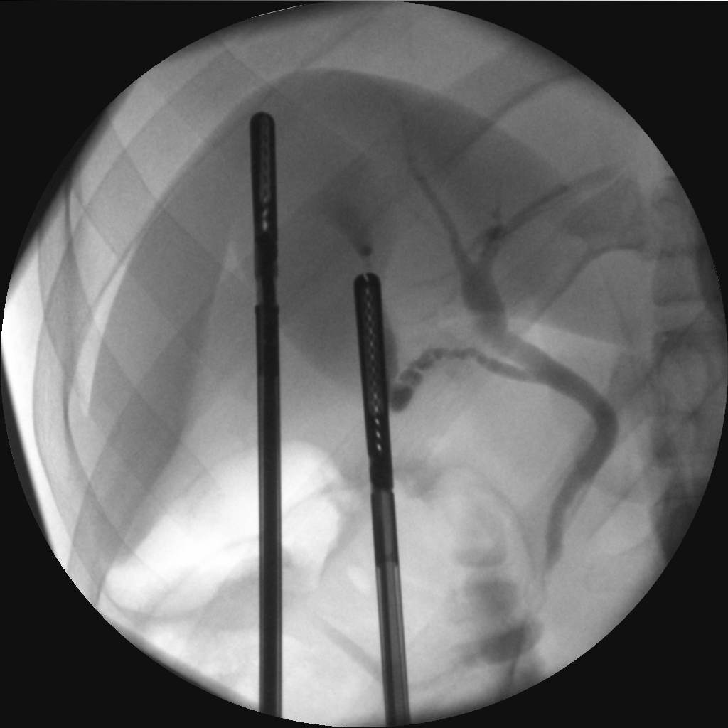
[frame 19/19]
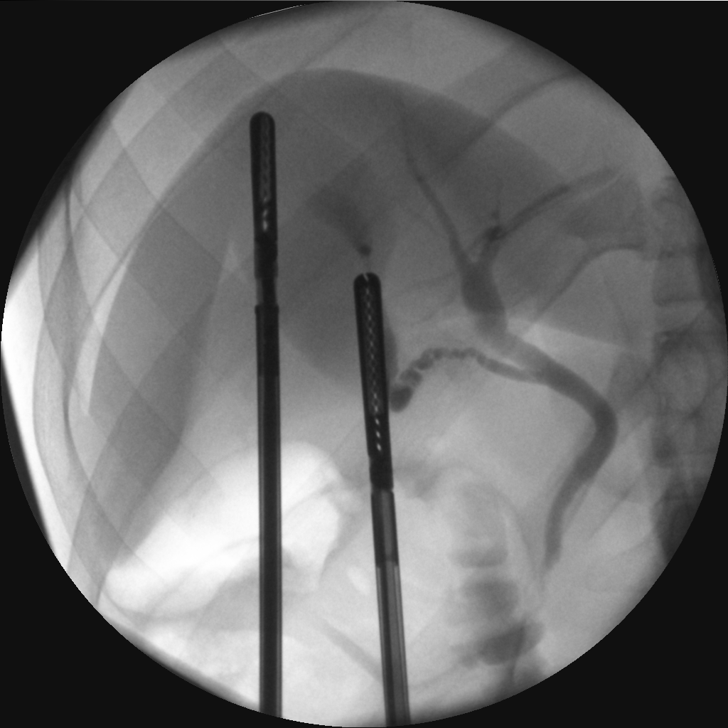

[10 of 10 positions shown; findings below may reference images not displayed]

FINDINGS: No evidence of biliary ductal of filling defects or obstruction.
Mild narrowing of the ampulla bladder most likely related to spasm
and/or incomplete filling.
IMPRESSION: No evidence of filling defect or obstruction.

## 2017-03-23 IMAGING — CR DG ABDOMEN 2V
3 series · 3 of 3 positions shown · non-contrast
Comparison: No priors.

CLINICAL DATA: 18-year-old female status post cholecystectomy [REDACTED] presenting with left lower quadrant abdominal pain and
nausea. No recent bowel movement.

EXAM:
ABDOMEN - 2 VIEW

[abdomen erect]
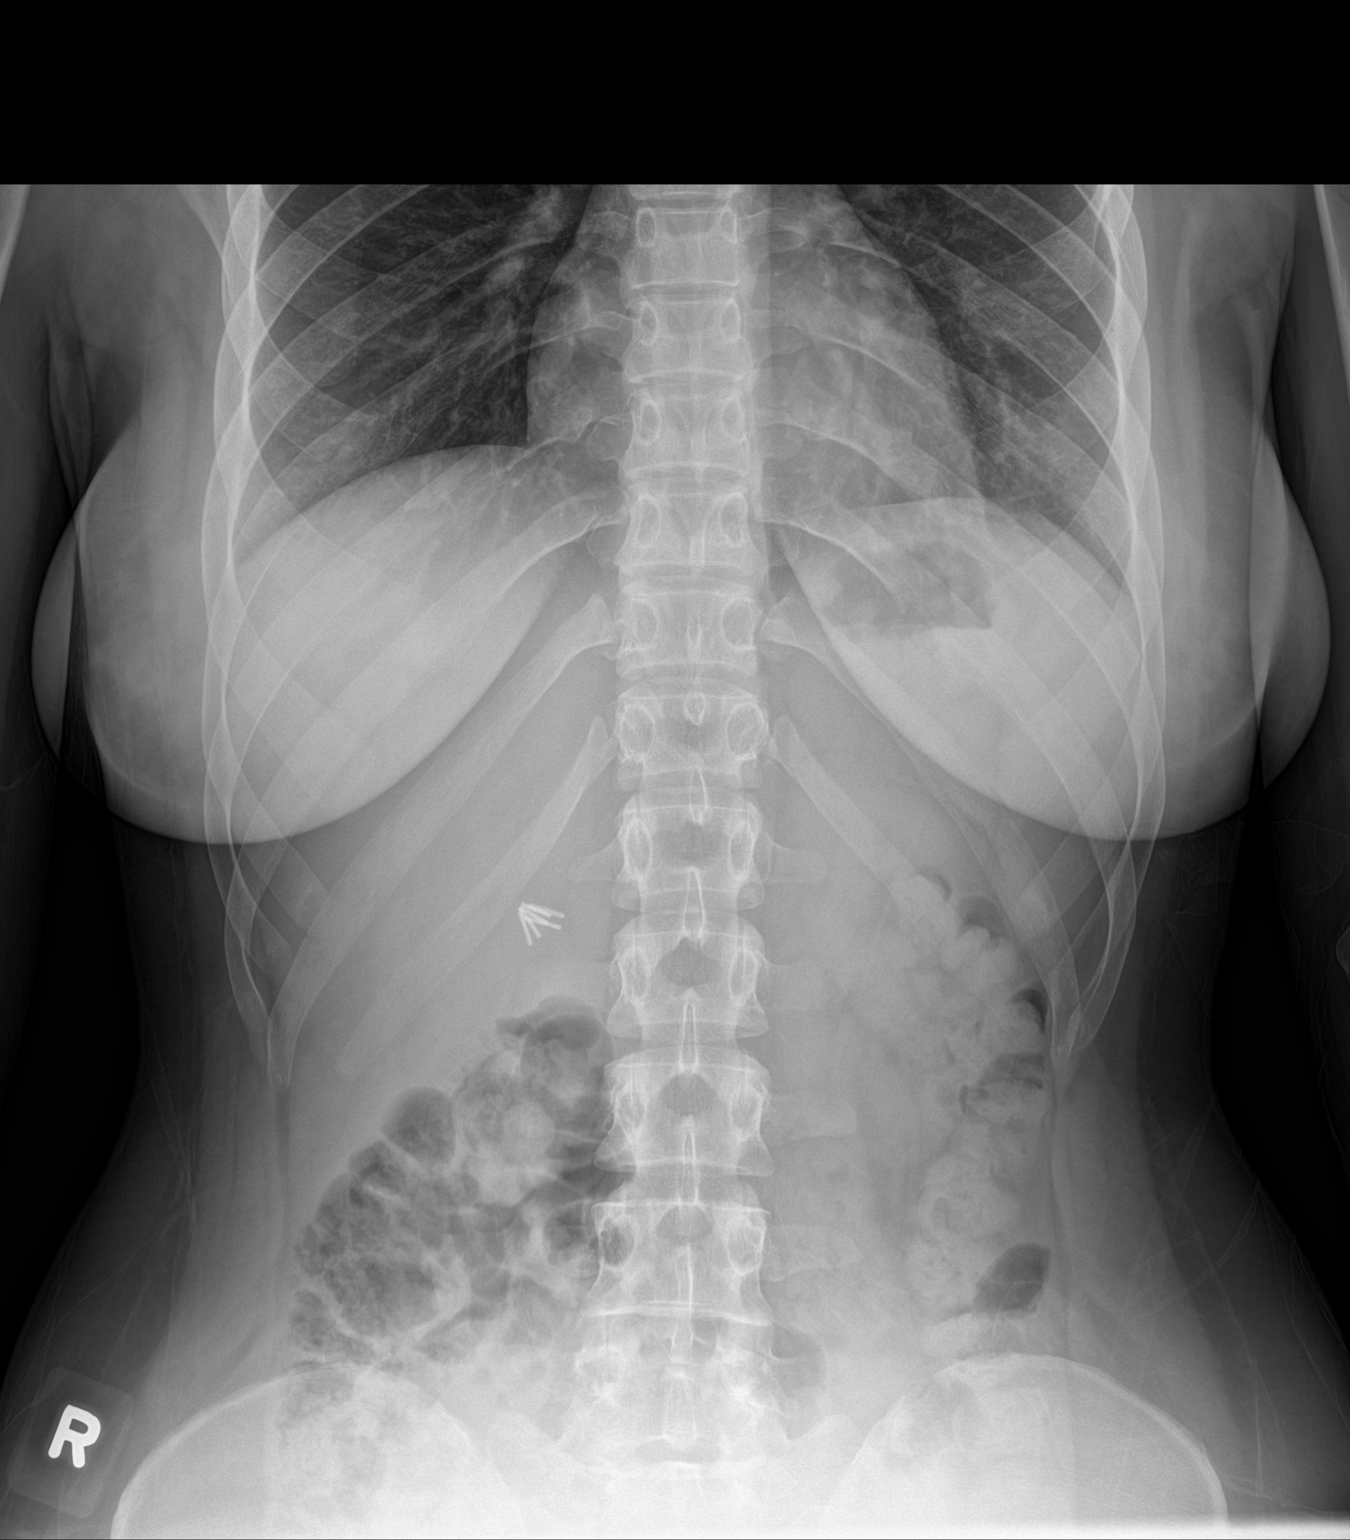

[abdomen supine (1 of 2)]
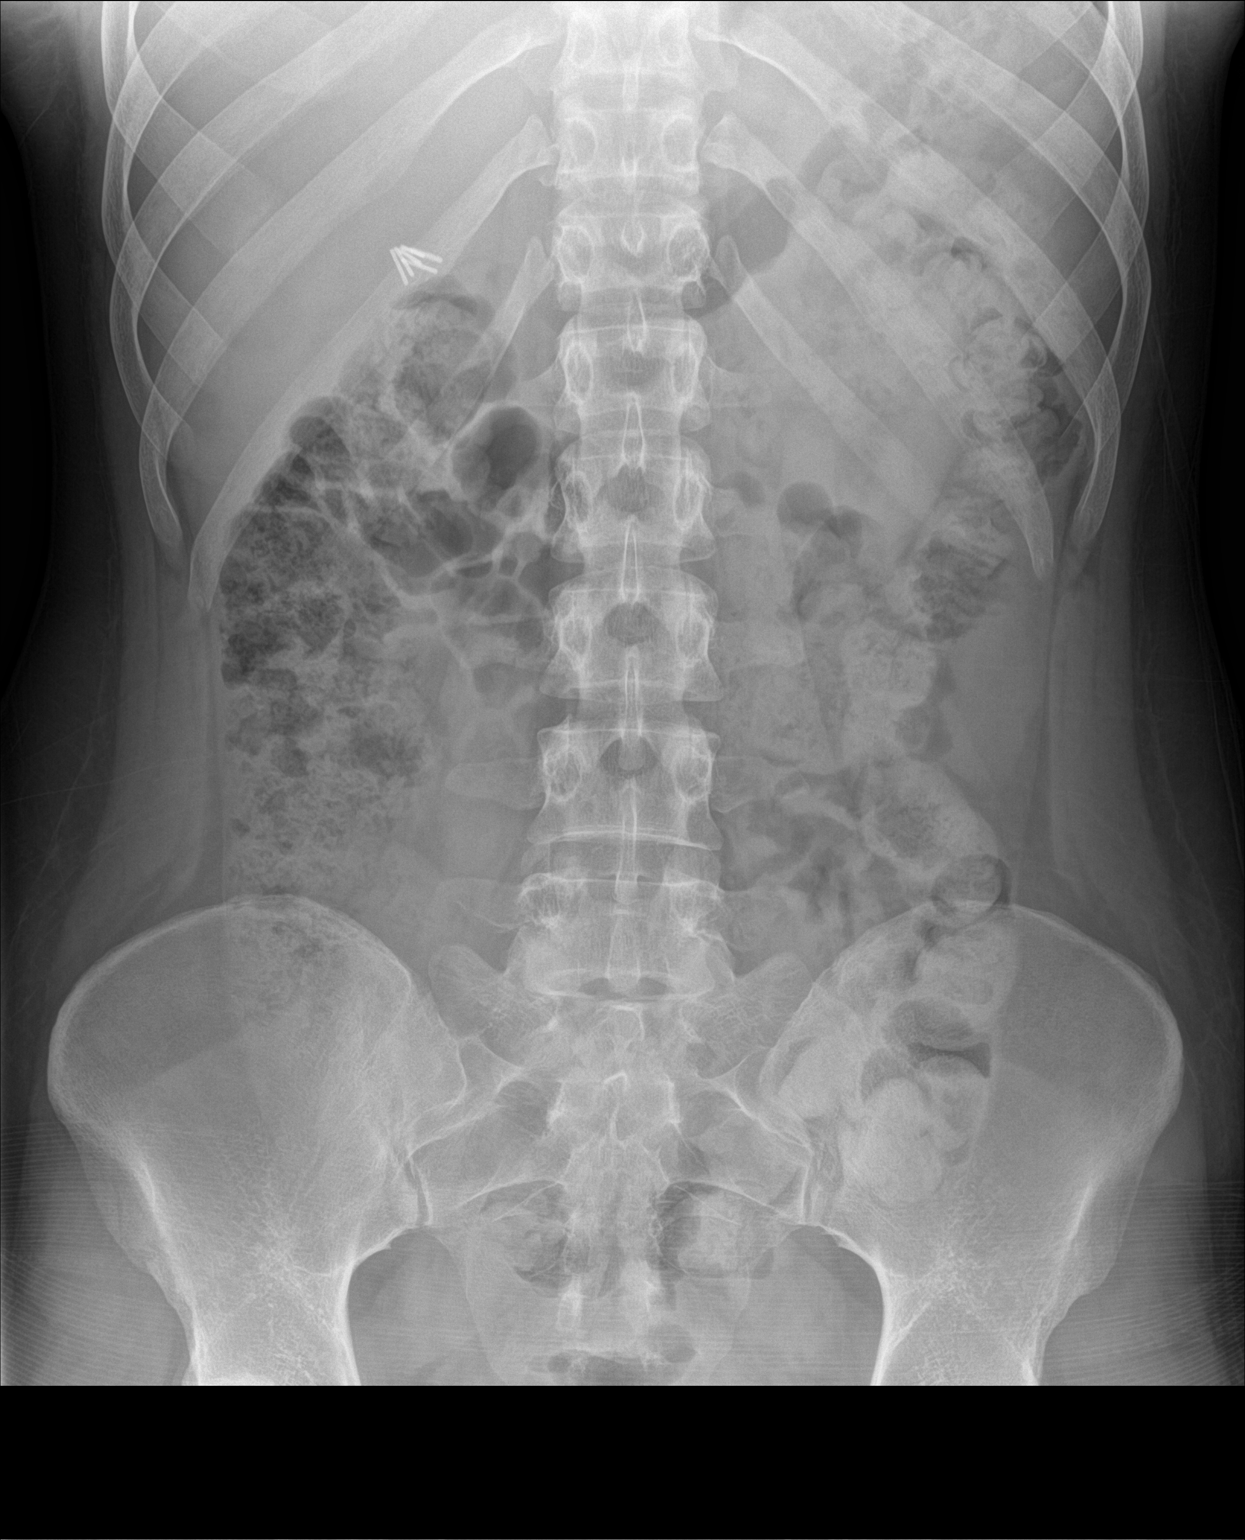

[abdomen supine (2 of 2)]
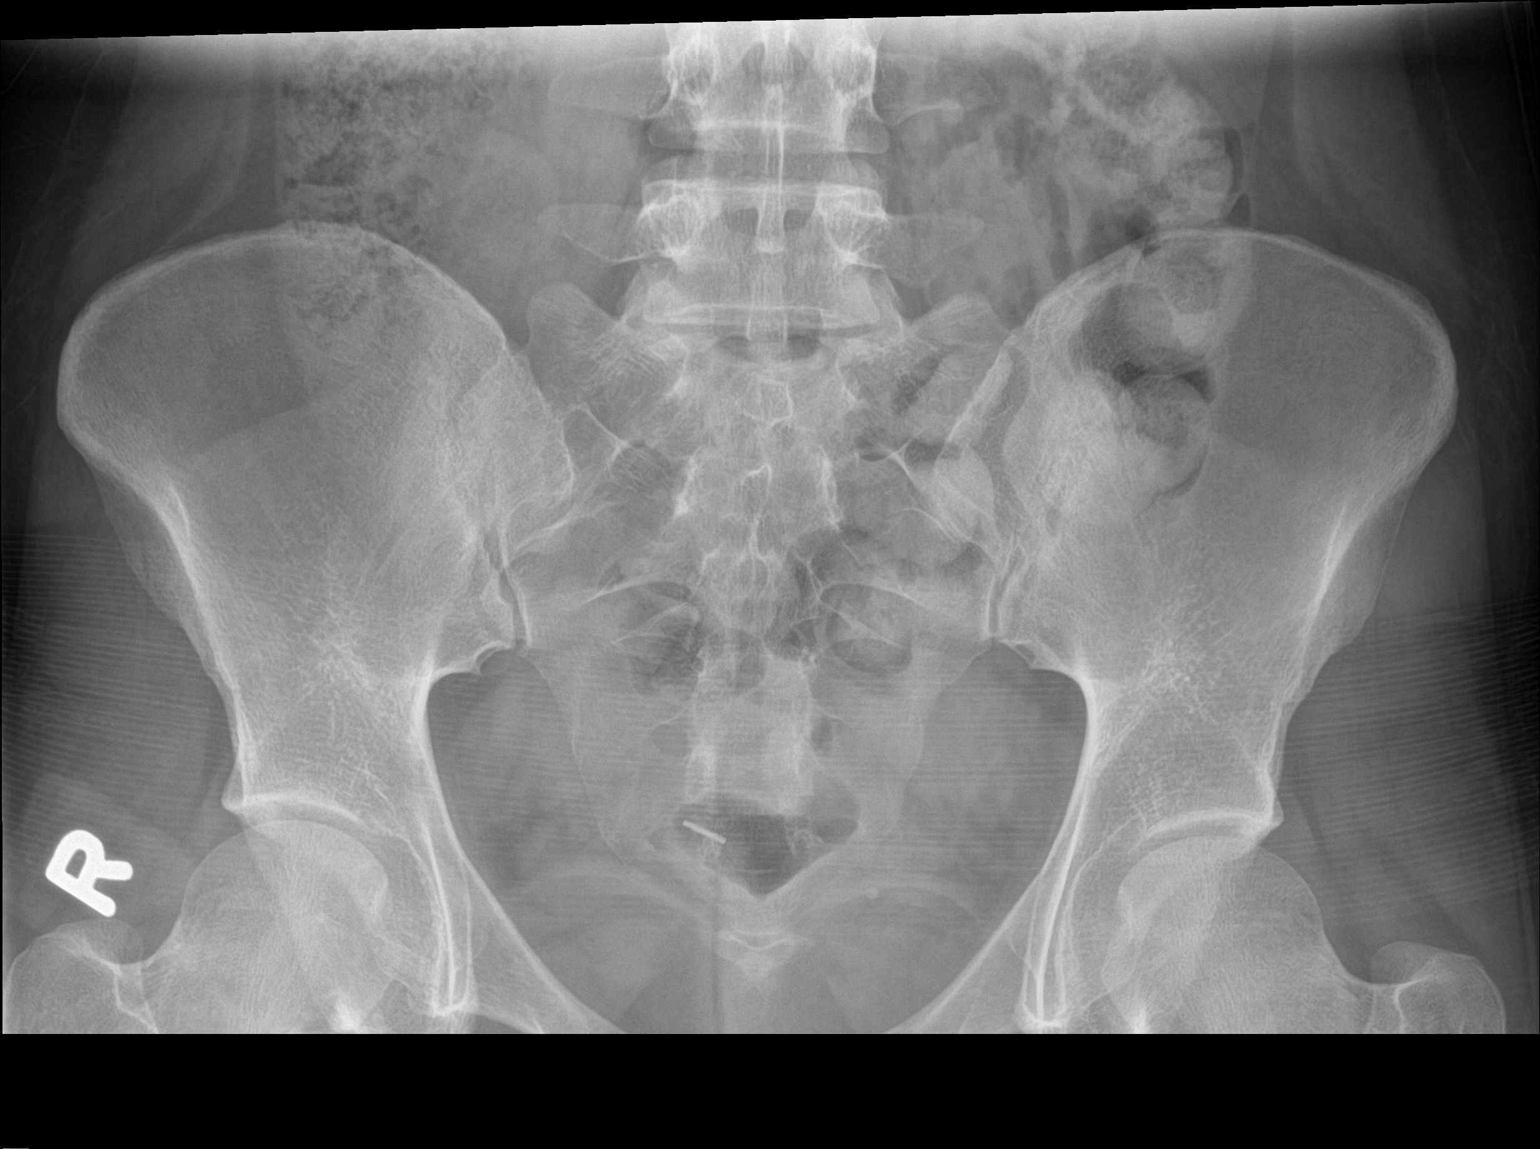

[3 of 3 positions shown; findings below may reference images not displayed]

FINDINGS: Surgical clips in the right upper quadrant, compatible with reported
cholecystectomy. Additional surgical clip in the low anatomic
pelvis. Gas and stool are noted throughout the colon extending to
the level of the distal rectum. Moderate stool volume may suggest
mild constipation. No pathologic dilatation of small bowel or colon.
No pneumoperitoneum.
IMPRESSION: 1. Nonobstructive bowel gas pattern.
2. No pneumoperitoneum.
3. Moderate stool volume suggestive of mild constipation.

## 2017-08-17 ENCOUNTER — Other Ambulatory Visit: Payer: Self-pay

## 2017-08-17 ENCOUNTER — Emergency Department
Admission: EM | Admit: 2017-08-17 | Discharge: 2017-08-17 | Disposition: A | Discharge status: Home / Self Care | Payer: BLUE CROSS/BLUE SHIELD | Attending: Emergency Medicine | Admitting: Emergency Medicine

## 2017-08-17 DIAGNOSIS — R197 Diarrhea, unspecified: Secondary | ICD-10-CM

## 2017-08-17 DIAGNOSIS — Z87891 Personal history of nicotine dependence: Secondary | ICD-10-CM | POA: Insufficient documentation

## 2017-08-17 LAB — COMPREHENSIVE METABOLIC PANEL WITH GFR
ALT: 12 U/L — ABNORMAL LOW (ref 14–54)
AST: 17 U/L (ref 15–41)
Albumin: 4 g/dL (ref 3.5–5.0)
Alkaline Phosphatase: 52 U/L (ref 38–126)
Anion gap: 8 (ref 5–15)
BUN: 12 mg/dL (ref 6–20)
CO2: 23 mmol/L (ref 22–32)
Calcium: 8.7 mg/dL — ABNORMAL LOW (ref 8.9–10.3)
Chloride: 106 mmol/L (ref 101–111)
Creatinine, Ser: 0.89 mg/dL (ref 0.44–1.00)
GFR calc Af Amer: >60 mL/min
GFR calc non Af Amer: >60 mL/min
Glucose, Bld: 93 mg/dL (ref 65–99)
Potassium: 4 mmol/L (ref 3.5–5.1)
Sodium: 137 mmol/L (ref 135–145)
Total Bilirubin: 0.6 mg/dL (ref 0.3–1.2)
Total Protein: 7 g/dL (ref 6.5–8.1)

## 2017-08-17 LAB — URINALYSIS, COMPLETE (UACMP) WITH MICROSCOPIC
Bilirubin Urine: NEGATIVE
Glucose, UA: NEGATIVE mg/dL
Ketones, ur: NEGATIVE mg/dL
Leukocytes, UA: NEGATIVE
Nitrite: NEGATIVE
Protein, ur: NEGATIVE mg/dL
Specific Gravity, Urine: 1.009 (ref 1.005–1.030)
pH: 6 (ref 5.0–8.0)

## 2017-08-17 LAB — CBC
HEMATOCRIT: 42.4 % (ref 35.0–47.0)
Hemoglobin: 13.9 g/dL (ref 12.0–16.0)
MCH: 28.1 pg (ref 26.0–34.0)
MCHC: 32.7 g/dL (ref 32.0–36.0)
MCV: 86 fL (ref 80.0–100.0)
PLATELETS: 181 10*3/uL (ref 150–440)
RBC: 4.92 MIL/uL (ref 3.80–5.20)
RDW: 13.4 % (ref 11.5–14.5)
WBC: 6.7 10*3/uL (ref 3.6–11.0)

## 2017-08-17 LAB — POCT PREGNANCY, URINE: Preg Test, Ur: NEGATIVE

## 2017-08-17 LAB — LIPASE, BLOOD: Lipase: 33 U/L (ref 11–51)

## 2017-08-17 MED ORDER — LOPERAMIDE HCL 2 MG PO CAPS
2.0000 mg | ORAL_CAPSULE | Freq: Once | ORAL | Status: AC
  Administered 2017-08-17: 2 mg via ORAL
  Filled 2017-08-17: qty 1

## 2017-08-17 MED ORDER — ONDANSETRON 4 MG PO TBDP
4.0000 mg | ORAL_TABLET | Freq: Once | ORAL | Status: AC
  Administered 2017-08-17: 4 mg via ORAL
  Filled 2017-08-17: qty 1

## 2017-08-17 MED ORDER — CIPROFLOXACIN HCL 500 MG PO TABS
750.0000 mg | ORAL_TABLET | Freq: Once | ORAL | Status: AC
  Administered 2017-08-17: 750 mg via ORAL
  Filled 2017-08-17: qty 2

## 2017-08-17 MED ORDER — ONDANSETRON HCL 4 MG PO TABS: 4.0000 mg | ORAL_TABLET | Freq: Every day | ORAL | 0 refills | Status: DC | PRN

## 2017-08-17 NOTE — ED Notes (Signed)
Pt reports a headache.  States taking tylenol with minimal relief.  No n/v.  Pt also has diarrhea and abd pain.  States loose yellow stools. Pt drinking cola and eating chips in room  Pt alert.

## 2017-08-17 NOTE — Discharge Instructions (Signed)
Please use your nausea and diarrhea medication as needed for severe symptoms and make sure you remain well-hydrated.  Make an appointment to establish care with primary care this week for reevaluation and return to the emergency department sooner for any concerns such as fevers, chills, worsening pain, if you cannot eat or drink, or for any other issues whatsoever.  It was a pleasure to take care of you today, and thank you for coming to our emergency department.  If you have any questions or concerns before leaving please ask the nurse to grab me and I'm more than happy to go through your aftercare instructions again.  If you were prescribed any opioid pain medication today such as Norco, Vicodin, Percocet, morphine, hydrocodone, or oxycodone please make sure you do not drive when you are taking this medication as it can alter your ability to drive safely.  If you have any concerns once you are home that you are not improving or are in fact getting worse before you can make it to your follow-up appointment, please do not hesitate to call 911 and come back for further evaluation.  Merrily BrittleNeil Johney Perotti, MD  Results for orders placed or performed during the hospital encounter of 08/17/17  Lipase, blood  Result Value Ref Range   Lipase 33 11 - 51 U/L  Comprehensive metabolic panel  Result Value Ref Range   Sodium 137 135 - 145 mmol/L   Potassium 4.0 3.5 - 5.1 mmol/L   Chloride 106 101 - 111 mmol/L   CO2 23 22 - 32 mmol/L   Glucose, Bld 93 65 - 99 mg/dL   BUN 12 6 - 20 mg/dL   Creatinine, Ser 1.610.89 0.44 - 1.00 mg/dL   Calcium 8.7 (L) 8.9 - 10.3 mg/dL   Total Protein 7.0 6.5 - 8.1 g/dL   Albumin 4.0 3.5 - 5.0 g/dL   AST 17 15 - 41 U/L   ALT 12 (L) 14 - 54 U/L   Alkaline Phosphatase 52 38 - 126 U/L   Total Bilirubin 0.6 0.3 - 1.2 mg/dL   GFR calc non Af Amer >60 >60 mL/min   GFR calc Af Amer >60 >60 mL/min   Anion gap 8 5 - 15  CBC  Result Value Ref Range   WBC 6.7 3.6 - 11.0 K/uL   RBC 4.92  3.80 - 5.20 MIL/uL   Hemoglobin 13.9 12.0 - 16.0 g/dL   HCT 09.642.4 04.535.0 - 40.947.0 %   MCV 86.0 80.0 - 100.0 fL   MCH 28.1 26.0 - 34.0 pg   MCHC 32.7 32.0 - 36.0 g/dL   RDW 81.113.4 91.411.5 - 78.214.5 %   Platelets 181 150 - 440 K/uL  Urinalysis, Complete w Microscopic  Result Value Ref Range   Color, Urine STRAW (A) YELLOW   APPearance CLEAR (A) CLEAR   Specific Gravity, Urine 1.009 1.005 - 1.030   pH 6.0 5.0 - 8.0   Glucose, UA NEGATIVE NEGATIVE mg/dL   Hgb urine dipstick MODERATE (A) NEGATIVE   Bilirubin Urine NEGATIVE NEGATIVE   Ketones, ur NEGATIVE NEGATIVE mg/dL   Protein, ur NEGATIVE NEGATIVE mg/dL   Nitrite NEGATIVE NEGATIVE   Leukocytes, UA NEGATIVE NEGATIVE   RBC / HPF 0-5 0 - 5 RBC/hpf   WBC, UA 0-5 0 - 5 WBC/hpf   Bacteria, UA FEW (A) NONE SEEN   Squamous Epithelial / LPF 0-5 (A) NONE SEEN  Pregnancy, urine POC  Result Value Ref Range   Preg Test, Ur NEGATIVE NEGATIVE

## 2017-08-17 NOTE — ED Provider Notes (Signed)
[facility] Emergency Department Provider Note  ____________________________________________   First MD Initiated Contact with Patient 08/17/17 1858     (approximate)  I have reviewed the triage vital signs and the nursing notes.   HISTORY  Chief Complaint Headache and Diarrhea   HPI [name] is a 22 y.o. female who self presents the emergency department with 5 days of loose watery stools.  Associated with cramping left lower quadrant intermittent moderate severity nonradiating discomfort.  The discomfort is worse with defecating and improved with rest.  Had 3-4 loose watery bowel movements a day.  She has not had antibiotics in over a year.  She has never left the country.  She does not eat seafood or shellfish.  She has not drunk any stream water.  She has had no fevers or chills.  She has some nausea but able to eat and drink.  Past Medical History:  Diagnosis Date  . Anemia   . Anxiety   . Bladder infection, acute 01-10-2015   completed antibiotics and denies any s&s of infection (01-30-15)  . Dysrhythmia    occ irregular heart beat per pt  . GERD (gastroesophageal reflux disease)    NO MEDS  . Headache    MIGRAINES DAILY    Patient Active Problem List   Diagnosis Date Noted  . Periumbilical abdominal pain 03/12/2015  . Gallstones 01/29/2015    Past Surgical History:  Procedure Laterality Date  . ADENOIDECTOMY    . CHOLECYSTECTOMY N/A 02/01/2015   Procedure: LAPAROSCOPIC CHOLECYSTECTOMY WITH INTRAOPERATIVE CHOLANGIOGRAM;  Surgeon: Earline MayotteJeffrey W Byrnett, MD;  Location: [facility] ORS;  Service: General;  Laterality: N/A;  . TONSILLECTOMY    . WISDOM TOOTH EXTRACTION      Prior to Admission medications   Medication Sig Start Date End Date Taking? Authorizing Provider  acetaminophen (TYLENOL) 325 MG tablet Take 650 mg by mouth every 6 (six) hours as needed.     [provider]  ibuprofen (ADVIL,MOTRIN) 800 MG tablet Take 1 tablet (800 mg  total) by mouth every 8 (eight) hours as needed. Patient not taking: Reported on 11/22/2015 02/05/15   Emily FilbertWilliams, Jonathan E, MD  ketorolac (TORADOL) 10 MG tablet Take 1 tablet (10 mg total) by mouth every 8 (eight) hours as needed for moderate pain (with food). 11/22/15   Rockne MenghiniNorman, Anne-Caroline, MD  metoCLOPramide (REGLAN) 10 MG tablet Take 1 tablet (10 mg total) by mouth every 8 (eight) hours as needed for nausea. 11/22/15 11/21/16  Rockne MenghiniNorman, Anne-Caroline, MD  ondansetron (ZOFRAN ODT) 4 MG disintegrating tablet Take 1 tablet (4 mg total) by mouth every 8 (eight) hours as needed for nausea or vomiting. 05/29/16   Irean HongSung, Jade J, MD  ondansetron (ZOFRAN) 4 MG tablet Take 1 tablet (4 mg total) by mouth daily as needed. 08/17/17 08/17/18  Merrily Brittleifenbark, Marcile Fuquay, MD  prochlorperazine (COMPAZINE) 10 MG tablet Take 1 tablet (10 mg total) by mouth every 6 (six) hours as needed for nausea. 05/29/16   Irean HongSung, Jade J, MD    Allergies Penicillins and Amoxicillin  Family History  Problem Relation Age of Onset  . Cancer Maternal Aunt        breast    Social History Social History   Tobacco Use  . Smoking status: Former Smoker    Packs/day: 0.50    Years: 8.00    Pack years: 4.00    Types: Cigarettes    Last attempt to quit: 11/08/2014    Years since quitting: 2.7  .  Smokeless tobacco: Never Used  Substance Use Topics  . Alcohol use: Yes    Alcohol/week: 0.0 oz    Comment: seldom  . Drug use: No    Review of Systems Constitutional: No fever/chills Eyes: No visual changes. ENT: No sore throat. Cardiovascular: Denies chest pain. Respiratory: Denies shortness of breath. Gastrointestinal: Positive for abdominal pain.  Positive for nausea, no vomiting.  Positive for diarrhea.  No constipation. Genitourinary: Negative for dysuria. Musculoskeletal: Negative for back pain. Skin: Negative for rash. Neurological: As of her headache but negative for focal  symptoms  ____________________________________________   PHYSICAL EXAM:  VITAL SIGNS: ED Triage Vitals  Enc Vitals Group     BP 08/17/17 1735 127/71     Pulse Rate 08/17/17 1735 65     Resp 08/17/17 1735 17     Temp 08/17/17 1735 98.7 F (37.1 C)     Temp Source 08/17/17 1735 Oral     SpO2 08/17/17 1735 99 %     Weight 08/17/17 1734 135 lb (61.2 kg)     Height 08/17/17 1734 5\' 1"  (1.549 m)     Head Circumference --      Peak Flow --      Pain Score 08/17/17 1738 6     Pain Loc --      Pain Edu? --      Excl. in GC? --     Constitutional: Alert and oriented x4 well-appearing nontoxic no diaphoresis speaks in full clear sentences Eyes: PERRL EOMI. Head: Atraumatic. Nose: No congestion/rhinnorhea. Mouth/Throat: No trismus Neck: No stridor.   Cardiovascular: Normal rate, regular rhythm. Grossly normal heart sounds.  Good peripheral circulation. Respiratory: Normal respiratory effort.  No retractions. Lungs CTAB and moving good air Gastrointestinal: Soft mild left lower quadrant tenderness although no rebound or guarding no peritonitis negative Rovsing's no McBurney's tenderness Musculoskeletal: No lower extremity edema   Neurologic:  Normal speech and language. No gross focal neurologic deficits are appreciated. Skin:  Skin is warm, dry and intact. No rash noted. Psychiatric: Mood and affect are normal. Speech and behavior are normal.    ____________________________________________   DIFFERENTIAL includes but not limited to  Appendicitis, diverticulitis, viral gastroenteritis, bacterial gastroenteritis, Clostridium difficile, ectopic pregnancy ____________________________________________   LABS (all labs ordered are listed, but only abnormal results are displayed)  Labs Reviewed  COMPREHENSIVE METABOLIC PANEL - Abnormal; Notable for the following components:      Result Value   Calcium 8.7 (*)    ALT 12 (*)    All other components within normal limits   URINALYSIS, COMPLETE (UACMP) WITH MICROSCOPIC - Abnormal; Notable for the following components:   Color, Urine STRAW (*)    APPearance CLEAR (*)    Hgb urine dipstick MODERATE (*)    Bacteria, UA FEW (*)    Squamous Epithelial / LPF 0-5 (*)    All other components within normal limits  LIPASE, BLOOD  CBC  POC URINE PREG, ED  POCT PREGNANCY, URINE    Lab work reviewed by me shows she is not pregnant in no acute disease __________________________________________  EKG   ____________________________________________  RADIOLOGY   ____________________________________________   PROCEDURES  Procedure(s) performed: no  Procedures  Critical Care performed: no  Observation: no ____________________________________________   INITIAL IMPRESSION / ASSESSMENT AND PLAN / ED COURSE  Pertinent labs & imaging results that were available during my care of the patient were reviewed by me and considered in my medical decision making (see chart for details).  The  patient arrives with 5 days of loose watery stool with no red flags for Clostridium difficile.  Her abdominal exam is benign and not consistent with appendicitis.  She does feel lightheaded and presyncopal which is likely secondary to dehydration.  Given the duration of her symptoms I do think it is reasonable to treat her as if they were bacterial diarrhea with a single dose of ciprofloxacin 750 mg now and treat her symptomatically with Zofran and loperamide.  Strict return precautions have been given and the patient verbalizes understanding and agreement with the plan.      ____________________________________________   FINAL CLINICAL IMPRESSION(S) / ED DIAGNOSES  Final diagnoses:  Diarrhea of presumed infectious origin      NEW MEDICATIONS STARTED DURING THIS VISIT:  New Prescriptions   ONDANSETRON (ZOFRAN) 4 MG TABLET    Take 1 tablet (4 mg total) by mouth daily as needed.     Note:  This document was prepared  using Dragon voice recognition software and [place] include unintentional dictation errors.     Merrily Brittle, MD 08/17/17 Ernestina [place]

## 2017-08-17 NOTE — ED Triage Notes (Signed)
Pt c/o watery stools since Thursday with a HA and dizziness. Denies vomiting or fever.

## 2018-01-18 ENCOUNTER — Ambulatory Visit
Admission: EM | Admit: 2018-01-18 | Discharge: 2018-01-18 | Disposition: A | Discharge status: Home / Self Care | Payer: Self-pay | Attending: Family Medicine | Admitting: Family Medicine

## 2018-01-18 ENCOUNTER — Encounter: Payer: Self-pay | Admitting: Emergency Medicine

## 2018-01-18 ENCOUNTER — Other Ambulatory Visit: Payer: Self-pay

## 2018-01-18 DIAGNOSIS — Z3A01 Less than 8 weeks gestation of pregnancy: Secondary | ICD-10-CM

## 2018-01-18 LAB — PREGNANCY, URINE: Preg Test, Ur: POSITIVE — AB

## 2018-01-18 LAB — HCG, QUANTITATIVE, PREGNANCY: hCG, Beta Chain, Quant, S: 160 m[IU]/mL — ABNORMAL HIGH (ref <5)

## 2018-01-18 MED ORDER — PRENATAL 28-0.8 MG PO TABS: ORAL_TABLET | ORAL | 3 refills | Status: DC

## 2018-01-18 NOTE — Discharge Instructions (Signed)
See a local OB.  Prenatal daily.  Take care  Dr. Adriana Simasook

## 2018-01-18 NOTE — ED Triage Notes (Addendum)
Patient in today because she took 2 home pregnancy tests, both were positive, but one was very faint. Patient was due to start her period today, which she hasn't. Patient has not been trying to get pregnant, but does not use any form of birth control. Patient states she was nauseous last week and her breasts are tender.

## 2018-01-18 NOTE — ED Provider Notes (Signed)
[facility]    CSN: [id number] Arrival date & time: 01/18/18  1845   History   Chief Complaint Chief Complaint  Patient presents with  . positive home pregnancy test   HPI  22 year old female presents with concerns that she is pregnant.  Patient states that she was due to start her menstrual cycle today and did not do so.  As a result, she took a pregnancy test at home.  She states that she had a faint positive.  Patient is concerned that she Gathers be pregnant.  She requesting testing today.  She reports that she is feeling well.  She has had some nausea as of last week.  No other associated symptoms.  Patient states that she is not using anything for birth control.  She does not want to be pregnant.  This was not planned.  She is not taking a prenatal.  Past Medical History:  Diagnosis Date  . Anemia   . Anxiety   . Bladder infection, acute 01-10-2015   completed antibiotics and denies any s&s of infection (01-30-15)  . Dysrhythmia    occ irregular heart beat per pt  . GERD (gastroesophageal reflux disease)    NO MEDS  . Headache    MIGRAINES DAILY    Patient Active Problem List   Diagnosis Date Noted  . Periumbilical abdominal pain 03/12/2015  . Gallstones 01/29/2015    Past Surgical History:  Procedure Laterality Date  . ADENOIDECTOMY    . CHOLECYSTECTOMY N/A 02/01/2015   Procedure: LAPAROSCOPIC CHOLECYSTECTOMY WITH INTRAOPERATIVE CHOLANGIOGRAM;  Surgeon: Earline MayotteJeffrey W Byrnett, MD;  Location: [facility] ORS;  Service: General;  Laterality: N/A;  . TONSILLECTOMY    . WISDOM TOOTH EXTRACTION      OB History   None    Obstetric Comments  1st Menstrual Cycle:  11 1st Pregnancy:  17          Home Medications    Prior to Admission medications   Medication Sig Start Date End Date Taking? Authorizing Provider  PRENATAL 28-0.8 MG TABS 1 tablet daily. 01/18/18   Tommie Samsook, Benyamin Jeff G, DO    Family History Family History  Problem Relation Age of Onset  . Healthy  Mother   . Hypertension Father   . Hyperlipidemia Father   . Cancer Maternal Aunt        breast    Social History Social History   Tobacco Use  . Smoking status: Current Every Day Smoker    Packs/day: 0.50    Years: 8.00    Pack years: 4.00    Types: Cigarettes  . Smokeless tobacco: Never Used  Substance Use Topics  . Alcohol use: Yes    Alcohol/week: 0.0 standard drinks    Comment: seldom  . Drug use: No     Allergies   Penicillins and Amoxicillin   Review of Systems Review of Systems  Constitutional: Negative.   Gastrointestinal: Positive for nausea.   Physical Exam Triage Vital Signs ED Triage Vitals  Enc Vitals Group     BP 01/18/18 1856 121/79     Pulse Rate 01/18/18 1856 (!) 101     Resp 01/18/18 1856 16     Temp 01/18/18 1856 98.2 F (36.8 C)     Temp Source 01/18/18 1856 Oral     SpO2 01/18/18 1856 100 %     Weight 01/18/18 1856 135 lb (61.2 kg)     Height 01/18/18 1856 5\' 1"  (1.549 m)     Head Circumference --  Peak Flow --      Pain Score 01/18/18 1855 0     Pain Loc --      Pain Edu? --      Excl. in GC? --    Updated Vital Signs BP 121/79 (BP Location: Left Arm)   Pulse (!) 101   Temp 98.2 F (36.8 C) (Oral)   Resp 16   Ht 5\' 1"  (1.549 m)   Wt 61.2 kg   LMP 12/18/2017 (Exact Date)   SpO2 100%   BMI 25.51 kg/m   Visual Acuity Right Eye Distance:   Left Eye Distance:   Bilateral Distance:    Right Eye Near:   Left Eye Near:    Bilateral Near:     Physical Exam  Constitutional: She is oriented to person, place, and time. She appears well-developed. No distress.  HENT:  Head: Normocephalic and atraumatic.  Cardiovascular: Normal rate and regular rhythm.  Pulmonary/Chest: Effort normal and breath sounds normal. She has no wheezes. She has no rales.  Neurological: She is alert and oriented to person, place, and time.  Psychiatric: She has a normal mood and affect. Her behavior is normal.  Nursing note and vitals  reviewed.  UC Treatments / Results  Labs (all labs ordered are listed, but only abnormal results are displayed) Labs Reviewed  PREGNANCY, URINE - Abnormal; Notable for the following components:      Result Value   Preg Test, Ur POSITIVE (*)    All other components within normal limits  HCG, QUANTITATIVE, PREGNANCY - Abnormal; Notable for the following components:   hCG, Beta Chain, Quant, S 160 (*)    All other components within normal limits    EKG None  Radiology No results found.  Procedures Procedures (including critical care time)  Medications Ordered in UC Medications - No data to display  Initial Impression / Assessment and Plan / UC Course  I have reviewed the triage vital signs and the nursing notes.  Pertinent labs & imaging results that were available during my care of the patient were reviewed by me and considered in my medical decision making (see chart for details).    22 year old female presents for concerns for pregnancy.  Positive pregnancy test.  Quant performed and was 160.  Advised to see OB.  Prenatal prescribed.  Final Clinical Impressions(s) / UC Diagnoses   Final diagnoses:  Less than [redacted] weeks gestation of pregnancy     Discharge Instructions     See a local OB.  Prenatal daily.  Take care  Dr. Adriana Simasook     ED Prescriptions    Medication Sig Dispense Auth. Provider   PRENATAL 28-0.8 MG TABS 1 tablet daily. 90 each Tommie Samsook, Nachelle Negrette G, DO     Controlled Substance Prescriptions [place] Controlled Substance Registry consulted? Not Applicable   Tommie SamsCook, Pavielle Biggar G, DO 01/18/18 2020

## 2018-01-24 ENCOUNTER — Other Ambulatory Visit: Payer: Self-pay

## 2018-01-24 ENCOUNTER — Emergency Department
Admission: EM | Admit: 2018-01-24 | Discharge: 2018-01-24 | Disposition: A | Discharge status: Home / Self Care | Payer: Self-pay | Attending: Emergency Medicine | Admitting: Emergency Medicine

## 2018-01-24 ENCOUNTER — Emergency Department: Payer: Self-pay

## 2018-01-24 DIAGNOSIS — O26899 Other specified pregnancy related conditions, unspecified trimester: Secondary | ICD-10-CM

## 2018-01-24 DIAGNOSIS — R109 Unspecified abdominal pain: Secondary | ICD-10-CM

## 2018-01-24 DIAGNOSIS — O209 Hemorrhage in early pregnancy, unspecified: Secondary | ICD-10-CM

## 2018-01-24 DIAGNOSIS — Z3A01 Less than 8 weeks gestation of pregnancy: Secondary | ICD-10-CM | POA: Insufficient documentation

## 2018-01-24 DIAGNOSIS — R1030 Lower abdominal pain, unspecified: Secondary | ICD-10-CM | POA: Insufficient documentation

## 2018-01-24 DIAGNOSIS — O208 Other hemorrhage in early pregnancy: Secondary | ICD-10-CM

## 2018-01-24 DIAGNOSIS — Z87891 Personal history of nicotine dependence: Secondary | ICD-10-CM | POA: Insufficient documentation

## 2018-01-24 DIAGNOSIS — O200 Threatened abortion: Secondary | ICD-10-CM | POA: Insufficient documentation

## 2018-01-24 LAB — COMPREHENSIVE METABOLIC PANEL
ALBUMIN: 3.9 g/dL (ref 3.5–5.0)
ALK PHOS: 59 U/L (ref 38–126)
ALT: 13 U/L (ref 0–44)
ANION GAP: 6 (ref 5–15)
AST: 18 U/L (ref 15–41)
BILIRUBIN TOTAL: 0.5 mg/dL (ref 0.3–1.2)
BUN: 11 mg/dL (ref 6–20)
CALCIUM: 8.8 mg/dL — AB (ref 8.9–10.3)
CO2: 26 mmol/L (ref 22–32)
Chloride: 108 mmol/L (ref 98–111)
Creatinine, Ser: 0.65 mg/dL (ref 0.44–1.00)
GFR calc Af Amer: >60 mL/min (ref >60)
GLUCOSE: 104 mg/dL — AB (ref 70–99)
Potassium: 3.6 mmol/L (ref 3.5–5.1)
Sodium: 140 mmol/L (ref 135–145)
TOTAL PROTEIN: 6.5 g/dL (ref 6.5–8.1)

## 2018-01-24 LAB — URINALYSIS, COMPLETE (UACMP) WITH MICROSCOPIC
Bacteria, UA: NONE SEEN
Bilirubin Urine: NEGATIVE
GLUCOSE, UA: NEGATIVE mg/dL
Ketones, ur: NEGATIVE mg/dL
NITRITE: NEGATIVE
PH: 8 (ref 5.0–8.0)
Protein, ur: NEGATIVE mg/dL
RBC / HPF: >50 RBC/hpf — ABNORMAL HIGH (ref 0–5)
Specific Gravity, Urine: 1.014 (ref 1.005–1.030)

## 2018-01-24 LAB — CBC
HCT: 39.4 % (ref 35.0–47.0)
HEMOGLOBIN: 13.2 g/dL (ref 12.0–16.0)
MCH: 29.4 pg (ref 26.0–34.0)
MCHC: 33.5 g/dL (ref 32.0–36.0)
MCV: 87.8 fL (ref 80.0–100.0)
Platelets: 174 10*3/uL (ref 150–440)
RBC: 4.49 MIL/uL (ref 3.80–5.20)
RDW: 13.7 % (ref 11.5–14.5)
WBC: 9.3 10*3/uL (ref 3.6–11.0)

## 2018-01-24 LAB — ABO/RH: ABO/RH(D): B NEG

## 2018-01-24 LAB — LIPASE, BLOOD: Lipase: 42 U/L (ref 11–51)

## 2018-01-24 LAB — ANTIBODY SCREEN: ANTIBODY SCREEN: NEGATIVE

## 2018-01-24 LAB — HCG, QUANTITATIVE, PREGNANCY: hCG, Beta Chain, Quant, S: 27 m[IU]/mL — ABNORMAL HIGH (ref <5)

## 2018-01-24 MED ORDER — RHO D IMMUNE GLOBULIN 1500 UNIT/2ML IJ SOSY
300.0000 ug | PREFILLED_SYRINGE | Freq: Once | INTRAMUSCULAR | Status: AC
  Administered 2018-01-24: 300 ug via INTRAMUSCULAR
  Filled 2018-01-24: qty 2

## 2018-01-24 NOTE — ED Notes (Signed)
No peripheral IV placed this visit.    Discharge instructions reviewed with patient. Questions fielded by this RN. Patient verbalizes understanding of instructions. Patient discharged home in stable condition per goodman. No acute distress noted at time of discharge.    

## 2018-01-24 NOTE — ED Provider Notes (Signed)
[facility] Emergency Department Provider Note   ____________________________________________   I have reviewed the triage vital signs and the nursing notes.   HISTORY  Chief Complaint Abdominal Pain   History limited by: Not Limited   HPI [name] is a 22 y.o. female who presents to the emergency department today because of concerns for vaginal bleeding and abdominal pain.  Patient states that she is about [redacted] weeks pregnant by dates.  She had a positive pregnancy test almost 1 week ago in urgent care.  She states started today she noticed some vaginal bleeding.  This has been accompanied by some lower abdominal pain.  This is the patient's second pregnancy.  She states that she was told that she lost a twin and her first pregnancy.  Patient has not had any significant nausea or vomiting.   Per medical record review patient has a history of bladder infection  Past Medical History:  Diagnosis Date  . Anemia   . Anxiety   . Bladder infection, acute 01-10-2015   completed antibiotics and denies any s&s of infection (01-30-15)  . Dysrhythmia    occ irregular heart beat per pt  . GERD (gastroesophageal reflux disease)    NO MEDS  . Headache    MIGRAINES DAILY    Patient Active Problem List   Diagnosis Date Noted  . Periumbilical abdominal pain 03/12/2015  . Gallstones 01/29/2015    Past Surgical History:  Procedure Laterality Date  . ADENOIDECTOMY    . CHOLECYSTECTOMY N/A 02/01/2015   Procedure: LAPAROSCOPIC CHOLECYSTECTOMY WITH INTRAOPERATIVE CHOLANGIOGRAM;  Surgeon: Earline MayotteJeffrey W Byrnett, MD;  Location: [facility] ORS;  Service: General;  Laterality: N/A;  . TONSILLECTOMY    . WISDOM TOOTH EXTRACTION      Prior to Admission medications   Medication Sig Start Date End Date Taking? Authorizing Provider  PRENATAL 28-0.8 MG TABS 1 tablet daily. 01/18/18   Tommie Samsook, Jayce G, DO    Allergies Penicillins and Amoxicillin  Family History  Problem Relation Age  of Onset  . Healthy Mother   . Hypertension Father   . Hyperlipidemia Father   . Cancer Maternal Aunt        breast    Social History Social History   Tobacco Use  . Smoking status: Former Smoker    Packs/day: 0.50    Years: 8.00    Pack years: 4.00    Types: Cigarettes  . Smokeless tobacco: Never Used  Substance Use Topics  . Alcohol use: Yes    Alcohol/week: 0.0 standard drinks    Comment: seldom  . Drug use: No    Review of Systems Constitutional: No fever/chills Eyes: No visual changes. ENT: No sore throat. Cardiovascular: Denies chest pain. Respiratory: Denies shortness of breath. Gastrointestinal: Positive for lower abdominal pain. Genitourinary: Positive for vaginal bleeding. Musculoskeletal: Negative for back pain. Skin: Negative for rash. Neurological: Negative for headaches, focal weakness or numbness.  ____________________________________________   PHYSICAL EXAM:  VITAL SIGNS: ED Triage Vitals [01/24/18 1709]  Enc Vitals Group     BP 117/78     Pulse Rate 94     Resp 18     Temp 98.7 F (37.1 C)     Temp Source Oral     SpO2 100 %     Weight 135 lb (61.2 kg)     Height 5\' 1"  (1.549 m)     Head Circumference      Peak Flow      Pain Score 9  Constitutional: Alert and oriented.  Eyes: Conjunctivae are normal.  ENT      Head: Normocephalic and atraumatic.      Nose: No congestion/rhinnorhea.      Mouth/Throat: Mucous membranes are moist.      Neck: No stridor. Hematological/Lymphatic/Immunilogical: No cervical lymphadenopathy. Cardiovascular: Normal rate, regular rhythm.  No murmurs, rubs, or gallops.  Respiratory: Normal respiratory effort without tachypnea nor retractions. Breath sounds are clear and equal bilaterally. No wheezes/rales/rhonchi. Gastrointestinal: Soft and non tender. No rebound. No guarding.  Genitourinary: Deferred Musculoskeletal: Normal range of motion in all extremities. No lower extremity edema. Neurologic:  Normal  speech and language. No gross focal neurologic deficits are appreciated.  Skin:  Skin is warm, dry and intact. No rash noted. Psychiatric: Mood and affect are normal. Speech and behavior are normal. Patient exhibits appropriate insight and judgment.  ____________________________________________    LABS (pertinent positives/negatives)  bhCG 27 Lipase 42 CBC wnl CMP wnl except glu 104, ca 8.8 UA large urine dipstick, trace leukocytes, >50 rbcs, 6-10 wbcs  ____________________________________________   EKG  None  ____________________________________________    RADIOLOGY  US  No pregnancy identified  ____________________________________________   PROCEDURES  Procedures  ____________________________________________   INITIAL IMPRESSION / ASSESSMENT AND PLAN / ED COURSE  Pertinent labs & imaging results that were available during my care of the patient were reviewed by me and considered in my medical decision making (see chart for details).   Patient presented to the emergency department today because of concerns for bleeding and abdominal cramping the setting of early pregnancy.  Differential would include ectopic, miscarriage, threatened miscarriage amongst other etiologies.  Unfortunate the pain is serum beta-hCG today is quite a bit lower than it was on Monday.  This along with the negative ultrasound is concerning for a true miscarriage.  At this point I doubt ectopic.  Discussed with patient however importance of OB/GYN follow-up.   ____________________________________________   FINAL CLINICAL IMPRESSION(S) / ED DIAGNOSES  Final diagnoses:  Threatened miscarriage     Note: This dictation was prepared with Dragon dictation. Any transcriptional errors that result from this process are unintentional     Phineas SemenGoodman, Axie Hayne, MD 01/24/18 2221

## 2018-01-24 NOTE — ED Triage Notes (Addendum)
Saw [facility] Monday and was told she was pregnant. States abd pain and vaginal bleeding, states spotting. States lots of cramping but denies bleeding through a pad. Believes about 5 weeks and few days along. States with previous pregnancy had bleeding, had twins and lost one.   Alert, oriented, normal color.

## 2018-01-24 NOTE — Discharge Instructions (Addendum)
Please follow up with ob/gyn. Please seek medical attention for any high fevers, chest pain, shortness of breath, change in behavior, persistent vomiting, bloody stool or any other new or concerning symptoms.

## 2018-01-25 LAB — RHOGAM INJECTION: UNIT DIVISION: 0

## 2018-01-26 LAB — URINE CULTURE: CULTURE: NO GROWTH

## 2018-04-11 ENCOUNTER — Other Ambulatory Visit: Payer: Self-pay

## 2018-04-11 ENCOUNTER — Emergency Department
Admission: EM | Admit: 2018-04-11 | Discharge: 2018-04-11 | Disposition: A | Discharge status: Home / Self Care | Payer: Self-pay | Attending: Emergency Medicine | Admitting: Emergency Medicine

## 2018-04-11 DIAGNOSIS — Z87891 Personal history of nicotine dependence: Secondary | ICD-10-CM | POA: Insufficient documentation

## 2018-04-11 DIAGNOSIS — E161 Other hypoglycemia: Secondary | ICD-10-CM | POA: Insufficient documentation

## 2018-04-11 LAB — BASIC METABOLIC PANEL
Anion gap: 6 (ref 5–15)
BUN: 13 mg/dL (ref 6–20)
CALCIUM: 9 mg/dL (ref 8.9–10.3)
CO2: 26 mmol/L (ref 22–32)
CREATININE: 0.73 mg/dL (ref 0.44–1.00)
Chloride: 109 mmol/L (ref 98–111)
GLUCOSE: 107 mg/dL — AB (ref 70–99)
Potassium: 3.3 mmol/L — ABNORMAL LOW (ref 3.5–5.1)
Sodium: 141 mmol/L (ref 135–145)

## 2018-04-11 LAB — CBC
HCT: 40.8 % (ref 36.0–46.0)
Hemoglobin: 12.8 g/dL (ref 12.0–15.0)
MCH: 28 pg (ref 26.0–34.0)
MCHC: 31.4 g/dL (ref 30.0–36.0)
MCV: 89.3 fL (ref 80.0–100.0)
NRBC: 0 % (ref 0.0–0.2)
PLATELETS: 212 10*3/uL (ref 150–400)
RBC: 4.57 MIL/uL (ref 3.87–5.11)
RDW: 12.7 % (ref 11.5–15.5)
WBC: 10.5 10*3/uL (ref 4.0–10.5)

## 2018-04-11 LAB — URINALYSIS, COMPLETE (UACMP) WITH MICROSCOPIC
Bilirubin Urine: NEGATIVE
Glucose, UA: NEGATIVE mg/dL
Hgb urine dipstick: NEGATIVE
Ketones, ur: NEGATIVE mg/dL
Nitrite: NEGATIVE
PH: 9 — AB (ref 5.0–8.0)
Protein, ur: NEGATIVE mg/dL
SPECIFIC GRAVITY, URINE: 1.009 (ref 1.005–1.030)

## 2018-04-11 LAB — POCT PREGNANCY, URINE: Preg Test, Ur: NEGATIVE

## 2018-04-11 NOTE — ED Triage Notes (Addendum)
Pt arrives to ED via POV from home with c/o dizziness, posterior neck pain, and near-syncopal episodes that started when she tried to sleep tonight. Pt reports nausea, but denies emesis. Pt reports chest pains "earlier", but denies c/o pain at this time. Pt is A&O, in NAD; RR even, regular, and unlabored.

## 2018-04-11 NOTE — ED Notes (Signed)
Patient reports earlier this evening she began to feel nauseated, her posterior throat felt warm, and she felt like she was about to become incontinent of stool.  Patient reports similar episodes in past. Patient reports that in the past she was told that she Bagent have either been hypoglycemic, or been having an anxiety attack.

## 2018-04-11 NOTE — ED Notes (Signed)
Reviewed discharge instructions, follow-up care with patient. Patient verbalized understanding of all information reviewed. Patient stable, with no distress noted at this time.    

## 2018-04-11 NOTE — ED Provider Notes (Signed)
[facility] Emergency Department Provider Note  ____________________________________________   First MD Initiated Contact with Patient 04/11/18 [phone]     (approximate)  I have reviewed the triage vital signs and the nursing notes.   HISTORY  Chief Complaint Dizziness and Neck Pain   HPI [name] is a 22 y.o. female who comes to the emergency department after feeling nauseated and lightheaded along with feeling the need to have a bowel movement.  Symptoms woke her up from sleep abruptly at 12:30 AM roughly 5 hours prior to my evaluation.  She said she ate a cookout for dinner and had a heavy meal then went shortly to bed.  She awoke shortly thereafter and felt like she had to have diarrhea and vomit at the same time but never actually threw up and never actually had diarrhea.  She felt slightly sweaty and weak and lightheaded.  This is the fourth time this is happened to her in the past.  She has been evaluated by a physician in the past and told that they do not think she has an arrhythmia and they are worried that her blood sugar might be low during these episodes.  These episodes have only ever happened after eating a fatty or greasy meal.  Her symptoms seemed to come on suddenly and lasted about 3 hours.  She notes they are associated with anxiety and numbness and tingling in her lips and fingers.    Past Medical History:  Diagnosis Date  . Anemia   . Anxiety   . Bladder infection, acute 01-10-2015   completed antibiotics and denies any s&s of infection (01-30-15)  . Dysrhythmia    occ irregular heart beat per pt  . GERD (gastroesophageal reflux disease)    NO MEDS  . Headache    MIGRAINES DAILY    Patient Active Problem List   Diagnosis Date Noted  . Periumbilical abdominal pain 03/12/2015  . Gallstones 01/29/2015    Past Surgical History:  Procedure Laterality Date  . ADENOIDECTOMY    . CHOLECYSTECTOMY N/A 02/01/2015   Procedure: LAPAROSCOPIC  CHOLECYSTECTOMY WITH INTRAOPERATIVE CHOLANGIOGRAM;  Surgeon: Earline Mayotte, MD;  Location: [facility] ORS;  Service: General;  Laterality: N/A;  . TONSILLECTOMY    . WISDOM TOOTH EXTRACTION      Prior to Admission medications   Medication Sig Start Date End Date Taking? Authorizing Provider  PRENATAL 28-0.8 MG TABS 1 tablet daily. 01/18/18   Tommie Sams, DO    Allergies Penicillins and Amoxicillin  Family History  Problem Relation Age of Onset  . Healthy Mother   . Hypertension Father   . Hyperlipidemia Father   . Cancer Maternal Aunt        breast    Social History Social History   Tobacco Use  . Smoking status: Former Smoker    Packs/day: 0.50    Years: 8.00    Pack years: 4.00    Types: Cigarettes  . Smokeless tobacco: Never Used  Substance Use Topics  . Alcohol use: Yes    Alcohol/week: 0.0 standard drinks    Comment: seldom  . Drug use: No    Review of Systems Constitutional: No fever/chills Eyes: No visual changes. ENT: No sore throat. Cardiovascular: Denies chest pain. Respiratory: Denies shortness of breath. Gastrointestinal: No abdominal pain.  Positive for nausea, no vomiting.  No diarrhea.  No constipation. Genitourinary: Negative for dysuria. Musculoskeletal: Negative for back pain. Skin: Negative for rash. Neurological: Positive for focal numbness  ____________________________________________   PHYSICAL EXAM:  VITAL SIGNS: ED Triage Vitals  Enc Vitals Group     BP 04/11/18 0402 122/74     Pulse Rate 04/11/18 0402 90     Resp 04/11/18 0402 18     Temp 04/11/18 0402 97.8 F (36.6 C)     Temp Source 04/11/18 0402 Oral     SpO2 04/11/18 0402 100 %     Weight 04/11/18 0400 135 lb (61.2 kg)     Height 04/11/18 0400 5\' 1"  (1.549 m)     Head Circumference --      Peak Flow --      Pain Score 04/11/18 0400 0     Pain Loc --      Pain Edu? --      Excl. in GC? --     Constitutional: Alert and oriented x4 well-appearing nontoxic no  diaphoresis speaks in full clear sentences Eyes: PERRL EOMI. Head: Atraumatic. Nose: No congestion/rhinnorhea. Mouth/Throat: No trismus Neck: No stridor.   Cardiovascular: Normal rate, regular rhythm. Grossly normal heart sounds.  Good peripheral circulation. Respiratory: Normal respiratory effort.  No retractions. Lungs CTAB and moving good air Gastrointestinal: Soft nondistended nontender no rebound or guarding no peritonitis Musculoskeletal: No lower extremity edema   Neurologic:  Normal speech and language. No gross focal neurologic deficits are appreciated. Skin:  Skin is warm, dry and intact. No rash noted. Psychiatric: Mood and affect are normal. Speech and behavior are normal.    ____________________________________________   DIFFERENTIAL includes but not limited to  Reactive hypoglycemia, panic attack, anxiety, gastroenteritis, food poisoning ____________________________________________   LABS (all labs ordered are listed, but only abnormal results are displayed)  Labs Reviewed  BASIC METABOLIC PANEL - Abnormal; Notable for the following components:      Result Value   Potassium 3.3 (*)    Glucose, Bld 107 (*)    All other components within normal limits  URINALYSIS, COMPLETE (UACMP) WITH MICROSCOPIC - Abnormal; Notable for the following components:   Color, Urine STRAW (*)    APPearance CLEAR (*)    pH 9.0 (*)    Leukocytes, UA LARGE (*)    Bacteria, UA RARE (*)    All other components within normal limits  CBC  POC URINE PREG, ED  POCT PREGNANCY, URINE    Lab work reviewed by me with some leukocytosis but she is asymptomatic and there are squames in the sample that is unreliable __________________________________________  EKG  ED ECG REPORT I, Merrily Brittle, the attending physician, personally viewed and interpreted this ECG.  Date: 04/11/2018 EKG Time:  Rate: 103 Rhythm: Sinus tachycardia QRS Axis: Rightward axis Intervals: normal ST/T Wave  abnormalities: normal Narrative Interpretation: no evidence of acute ischemia.  Rightward axis is old compared to previous EKG 05/29/2016  ____________________________________________  RADIOLOGY   ____________________________________________   PROCEDURES  Procedure(s) performed: no  Procedures  Critical Care performed: no  ____________________________________________   INITIAL IMPRESSION / ASSESSMENT AND PLAN / ED COURSE  Pertinent labs & imaging results that were available during my care of the patient were reviewed by me and considered in my medical decision making (see chart for details).   As part of my medical decision making, I reviewed the following data within the electronic MEDICAL RECORD NUMBER History obtained from family if available, nursing notes, old chart and ekg, as well as notes from prior ED visits.  At the time I saw the patient she was completely asymptomatic.  She had about a  3-hour episode of nausea and the need to have a bowel movement without actually having vomiting or diarrhea.  Is associated with lightheadedness and an overwhelming sense of fatigue and malaise.  Her symptoms do seem to be consistent with reactive hypoglycemia.  They did not begin with anxiety although the numbness and tingling is likely secondary to hyperventilation and anxiety from the acute medical issue.  EKG is nonischemic and she was kept on monitor with no ectopy.  I discussed with the patient the importance of reestablishing care with primary care for physical and to give a trial of healthier food and to avoid large heavy greasy meals and to see if that helps prevent these episodes.  Strict return precautions have been given.      ____________________________________________   FINAL CLINICAL IMPRESSION(S) / ED DIAGNOSES  Final diagnoses:  Reactive hypoglycemia      NEW MEDICATIONS STARTED DURING THIS VISIT:  Discharge Medication List as of 04/11/2018  6:03 AM       Note:   This document was prepared using Dragon voice recognition software and Kostelnik include unintentional dictation errors.     Merrily Brittle, MD 04/11/18 2233

## 2018-04-11 NOTE — ED Notes (Signed)
ED Provider at bedside. 

## 2018-04-11 NOTE — Discharge Instructions (Signed)
Please try to avoid heavy greasy meals in the future and see if that improves your symptoms.  Please make an appointment to follow-up with your primary care physician this coming week for recheck return to the emergency department sooner for any concerns.  It was a pleasure to take care of you today, and thank you for coming to our emergency department.  If you have any questions or concerns before leaving please ask the nurse to grab me and I'm more than happy to go through your aftercare instructions again.  If you were prescribed any opioid pain medication today such as Norco, Vicodin, Percocet, morphine, hydrocodone, or oxycodone please make sure you do not drive when you are taking this medication as it can alter your ability to drive safely.  If you have any concerns once you are home that you are not improving or are in fact getting worse before you can make it to your follow-up appointment, please do not hesitate to call 911 and come back for further evaluation.  Merrily Brittle, MD  Results for orders placed or performed during the hospital encounter of 04/11/18  Basic metabolic panel  Result Value Ref Range   Sodium 141 135 - 145 mmol/L   Potassium 3.3 (L) 3.5 - 5.1 mmol/L   Chloride 109 98 - 111 mmol/L   CO2 26 22 - 32 mmol/L   Glucose, Bld 107 (H) 70 - 99 mg/dL   BUN 13 6 - 20 mg/dL   Creatinine, Ser 1.61 0.44 - 1.00 mg/dL   Calcium 9.0 8.9 - 09.6 mg/dL   GFR calc non Af Amer >60 >60 mL/min   GFR calc Af Amer >60 >60 mL/min   Anion gap 6 5 - 15  CBC  Result Value Ref Range   WBC 10.5 4.0 - 10.5 K/uL   RBC 4.57 3.87 - 5.11 MIL/uL   Hemoglobin 12.8 12.0 - 15.0 g/dL   HCT 04.5 40.9 - 81.1 %   MCV 89.3 80.0 - 100.0 fL   MCH 28.0 26.0 - 34.0 pg   MCHC 31.4 30.0 - 36.0 g/dL   RDW 91.4 78.2 - 95.6 %   Platelets 212 150 - 400 K/uL   nRBC 0.0 0.0 - 0.2 %  Urinalysis, Complete w Microscopic  Result Value Ref Range   Color, Urine STRAW (A) YELLOW   APPearance CLEAR (A) CLEAR   Specific Gravity, Urine 1.009 1.005 - 1.030   pH 9.0 (H) 5.0 - 8.0   Glucose, UA NEGATIVE NEGATIVE mg/dL   Hgb urine dipstick NEGATIVE NEGATIVE   Bilirubin Urine NEGATIVE NEGATIVE   Ketones, ur NEGATIVE NEGATIVE mg/dL   Protein, ur NEGATIVE NEGATIVE mg/dL   Nitrite NEGATIVE NEGATIVE   Leukocytes, UA LARGE (A) NEGATIVE   RBC / HPF 0-5 0 - 5 RBC/hpf   WBC, UA 11-20 0 - 5 WBC/hpf   Bacteria, UA RARE (A) NONE SEEN   Squamous Epithelial / LPF 6-10 0 - 5   Mucus PRESENT   Pregnancy, urine POC  Result Value Ref Range   Preg Test, Ur NEGATIVE NEGATIVE

## 2018-04-13 ENCOUNTER — Emergency Department (HOSPITAL_COMMUNITY)
Admission: EM | Admit: 2018-04-13 | Discharge: 2018-04-13 | Discharge status: Against Medical Advice | Payer: Self-pay | Attending: Emergency Medicine | Admitting: Emergency Medicine

## 2018-04-13 ENCOUNTER — Encounter (HOSPITAL_COMMUNITY): Payer: Self-pay | Admitting: *Deleted

## 2018-04-13 ENCOUNTER — Other Ambulatory Visit: Payer: Self-pay

## 2018-04-13 ENCOUNTER — Encounter: Payer: Self-pay | Admitting: Emergency Medicine

## 2018-04-13 DIAGNOSIS — E876 Hypokalemia: Secondary | ICD-10-CM | POA: Insufficient documentation

## 2018-04-13 DIAGNOSIS — Z87891 Personal history of nicotine dependence: Secondary | ICD-10-CM | POA: Insufficient documentation

## 2018-04-13 DIAGNOSIS — K529 Noninfective gastroenteritis and colitis, unspecified: Secondary | ICD-10-CM | POA: Insufficient documentation

## 2018-04-13 DIAGNOSIS — Z5321 Procedure and treatment not carried out due to patient leaving prior to being seen by health care provider: Secondary | ICD-10-CM | POA: Insufficient documentation

## 2018-04-13 DIAGNOSIS — Z9049 Acquired absence of other specified parts of digestive tract: Secondary | ICD-10-CM | POA: Insufficient documentation

## 2018-04-13 DIAGNOSIS — F410 Panic disorder [episodic paroxysmal anxiety] without agoraphobia: Secondary | ICD-10-CM | POA: Insufficient documentation

## 2018-04-13 DIAGNOSIS — E860 Dehydration: Secondary | ICD-10-CM | POA: Insufficient documentation

## 2018-04-13 DIAGNOSIS — R55 Syncope and collapse: Principal | ICD-10-CM | POA: Insufficient documentation

## 2018-04-13 DIAGNOSIS — R110 Nausea: Secondary | ICD-10-CM | POA: Insufficient documentation

## 2018-04-13 LAB — URINALYSIS, ROUTINE W REFLEX MICROSCOPIC
BILIRUBIN URINE: NEGATIVE
Glucose, UA: NEGATIVE mg/dL
HGB URINE DIPSTICK: NEGATIVE
KETONES UR: NEGATIVE mg/dL
LEUKOCYTES UA: NEGATIVE
Nitrite: NEGATIVE
PROTEIN: NEGATIVE mg/dL
Specific Gravity, Urine: 1.011 (ref 1.005–1.030)
pH: 8 (ref 5.0–8.0)

## 2018-04-13 LAB — GLUCOSE, CAPILLARY: Glucose-Capillary: 121 mg/dL — ABNORMAL HIGH (ref 70–99)

## 2018-04-13 LAB — CBG MONITORING, ED: Glucose-Capillary: 113 mg/dL — ABNORMAL HIGH (ref 70–99)

## 2018-04-13 LAB — BASIC METABOLIC PANEL
ANION GAP: 8 (ref 5–15)
BUN: 12 mg/dL (ref 6–20)
CALCIUM: 9.1 mg/dL (ref 8.9–10.3)
CO2: 24 mmol/L (ref 22–32)
CREATININE: 0.74 mg/dL (ref 0.44–1.00)
Chloride: 107 mmol/L (ref 98–111)
GFR calc Af Amer: >60 mL/min (ref >60)
GFR calc non Af Amer: >60 mL/min (ref >60)
Glucose, Bld: 103 mg/dL — ABNORMAL HIGH (ref 70–99)
Potassium: 3.4 mmol/L — ABNORMAL LOW (ref 3.5–5.1)
Sodium: 139 mmol/L (ref 135–145)

## 2018-04-13 LAB — CBC
HCT: 44.5 % (ref 36.0–46.0)
Hemoglobin: 13.5 g/dL (ref 12.0–15.0)
MCH: 27.6 pg (ref 26.0–34.0)
MCHC: 30.3 g/dL (ref 30.0–36.0)
MCV: 91 fL (ref 80.0–100.0)
NRBC: 0 % (ref 0.0–0.2)
PLATELETS: 232 10*3/uL (ref 150–400)
RBC: 4.89 MIL/uL (ref 3.87–5.11)
RDW: 12.7 % (ref 11.5–15.5)
WBC: 10 10*3/uL (ref 4.0–10.5)

## 2018-04-13 LAB — I-STAT BETA HCG BLOOD, ED (MC, WL, AP ONLY): I-stat hCG, quantitative: <5 m[IU]/mL (ref <5)

## 2018-04-13 NOTE — ED Notes (Signed)
This Rn accompanied the pt to the bathroom, pt states, "I have to poop and I am afraid to go by myself."

## 2018-04-13 NOTE — ED Notes (Signed)
Pt left AMA with husband and son

## 2018-04-13 NOTE — ED Provider Notes (Signed)
Patient placed in Quick Look pathway, seen and evaluated   Chief Complaint: nausea  HPI: Luisa Louk Swiatek is a 22 y.o. female who present to the ED with nausea and abdominal pain. Patient was evaluated at [facility] for similar symptoms 3 days ago and told she was hypoglycemic. Symptoms started again today about 30 minutes prior to arrival to the ED.   ROS: GI: nausea, abdominal pain  Physical Exam:  BP (!) 147/94 (BP Location: Right Arm)   Pulse 81   Temp 98.1 F (36.7 C) (Oral)   Resp 16   LMP 03/23/2018 (Approximate)   SpO2 100%   Breastfeeding? No    Gen: No distress  Neuro: Awake and Alert  Skin: Warm and dry  Heart: regular rate and rhythm  Lungs: clear  Abdomen: soft tender lower abdomen but patient reports Rosello be due to needing to void.   Initiation of care has begun. The patient has been counseled on the process, plan, and necessity for staying for the completion/evaluation, and the remainder of the medical screening examination    Janne Napoleon, NP 04/13/18 1423    Linwood Dibbles, MD 04/13/18 1615

## 2018-04-13 NOTE — ED Triage Notes (Signed)
Pt in c/o nausea, pt reports being seen at [facility] Saturday and was told that she was hypoglycemic, symptom onset today x 30 mins ago, pt HR, pt denies v/d, pt A&O x4

## 2018-04-13 NOTE — ED Triage Notes (Signed)
Pt states that she was seen here Saturday, and diagnosised with hypoglycemia. She states that she was told that if she felt her symptoms to come back. She started having symptoms this afternoon so she went to the [facility] and reports that she passed out, they did blood work, urine and EKG on her. She states that she sat in a hallway for hours and left. She thought that she Rohrbach feel better but did not so she came here to be checked out.

## 2018-04-14 ENCOUNTER — Observation Stay
Admission: EM | Admit: 2018-04-14 | Discharge: 2018-04-15 | Disposition: A | Discharge status: Home / Self Care | Payer: Self-pay | Attending: Internal Medicine | Admitting: Internal Medicine

## 2018-04-14 ENCOUNTER — Other Ambulatory Visit: Payer: Self-pay

## 2018-04-14 ENCOUNTER — Encounter: Payer: Self-pay | Admitting: Internal Medicine

## 2018-04-14 DIAGNOSIS — R55 Syncope and collapse: Secondary | ICD-10-CM

## 2018-04-14 DIAGNOSIS — E860 Dehydration: Secondary | ICD-10-CM

## 2018-04-14 LAB — COMPREHENSIVE METABOLIC PANEL
ALT: 12 U/L (ref 0–44)
AST: 20 U/L (ref 15–41)
Albumin: 4.1 g/dL (ref 3.5–5.0)
Alkaline Phosphatase: 54 U/L (ref 38–126)
Anion gap: 7 (ref 5–15)
BUN: 12 mg/dL (ref 6–20)
CHLORIDE: 108 mmol/L (ref 98–111)
CO2: 25 mmol/L (ref 22–32)
CREATININE: 0.71 mg/dL (ref 0.44–1.00)
Calcium: 8.9 mg/dL (ref 8.9–10.3)
GFR calc non Af Amer: >60 mL/min (ref >60)
Glucose, Bld: 115 mg/dL — ABNORMAL HIGH (ref 70–99)
POTASSIUM: 3.3 mmol/L — AB (ref 3.5–5.1)
SODIUM: 140 mmol/L (ref 135–145)
Total Bilirubin: 0.5 mg/dL (ref 0.3–1.2)
Total Protein: 6.9 g/dL (ref 6.5–8.1)

## 2018-04-14 LAB — CBC WITH DIFFERENTIAL/PLATELET
Abs Immature Granulocytes: 0.06 10*3/uL (ref 0.00–0.07)
BASOS ABS: 0.1 10*3/uL (ref 0.0–0.1)
Basophils Relative: 0 %
Eosinophils Absolute: 0.1 10*3/uL (ref 0.0–0.5)
Eosinophils Relative: 1 %
HCT: 41.7 % (ref 36.0–46.0)
Hemoglobin: 13.4 g/dL (ref 12.0–15.0)
Immature Granulocytes: 1 %
LYMPHS ABS: 3 10*3/uL (ref 0.7–4.0)
Lymphocytes Relative: 26 %
MCH: 28.3 pg (ref 26.0–34.0)
MCHC: 32.1 g/dL (ref 30.0–36.0)
MCV: 88.2 fL (ref 80.0–100.0)
MONOS PCT: 11 %
Monocytes Absolute: 1.3 10*3/uL — ABNORMAL HIGH (ref 0.1–1.0)
NEUTROS PCT: 61 %
NRBC: 0 % (ref 0.0–0.2)
Neutro Abs: 7 10*3/uL (ref 1.7–7.7)
PLATELETS: 219 10*3/uL (ref 150–400)
RBC: 4.73 MIL/uL (ref 3.87–5.11)
RDW: 12.6 % (ref 11.5–15.5)
WBC: 11.5 10*3/uL — ABNORMAL HIGH (ref 4.0–10.5)

## 2018-04-14 LAB — URINALYSIS, COMPLETE (UACMP) WITH MICROSCOPIC
BACTERIA UA: NONE SEEN
BILIRUBIN URINE: NEGATIVE
Glucose, UA: NEGATIVE mg/dL
Hgb urine dipstick: NEGATIVE
Ketones, ur: NEGATIVE mg/dL
NITRITE: NEGATIVE
PH: 5 (ref 5.0–8.0)
Protein, ur: NEGATIVE mg/dL
SPECIFIC GRAVITY, URINE: 1.02 (ref 1.005–1.030)

## 2018-04-14 LAB — TSH: TSH: 0.955 u[IU]/mL (ref 0.350–4.500)

## 2018-04-14 LAB — HEMOGLOBIN A1C
Hgb A1c MFr Bld: 4.4 % — ABNORMAL LOW (ref 4.8–5.6)
Mean Plasma Glucose: 79.58 mg/dL

## 2018-04-14 LAB — LIPASE, BLOOD: Lipase: 56 U/L — ABNORMAL HIGH (ref 11–51)

## 2018-04-14 LAB — TROPONIN I: Troponin I: <0.03 ng/mL (ref <0.03)

## 2018-04-14 LAB — BRAIN NATRIURETIC PEPTIDE: B Natriuretic Peptide: 8 pg/mL (ref 0.0–100.0)

## 2018-04-14 LAB — HCG, QUANTITATIVE, PREGNANCY: hCG, Beta Chain, Quant, S: <1 m[IU]/mL (ref <5)

## 2018-04-14 MED ORDER — DIPHENHYDRAMINE HCL 25 MG PO CAPS
25.0000 mg | ORAL_CAPSULE | Freq: Once | ORAL | Status: AC | PRN
  Administered 2018-04-14: 25 mg via ORAL
  Filled 2018-04-14: qty 1

## 2018-04-14 MED ORDER — ENOXAPARIN SODIUM 40 MG/0.4ML ~~LOC~~ SOLN
40.0000 mg | SUBCUTANEOUS | Status: DC
  Filled 2018-04-14: qty 0.4

## 2018-04-14 MED ORDER — DOCUSATE SODIUM 100 MG PO CAPS: 100.0000 mg | ORAL_CAPSULE | Freq: Two times a day (BID) | ORAL | Status: DC

## 2018-04-14 MED ORDER — ONDANSETRON HCL 4 MG/2ML IJ SOLN
4.0000 mg | Freq: Once | INTRAMUSCULAR | Status: DC
  Filled 2018-04-14: qty 2

## 2018-04-14 MED ORDER — LORAZEPAM 0.5 MG PO TABS
0.2500 mg | ORAL_TABLET | Freq: Four times a day (QID) | ORAL | Status: DC | PRN
  Administered 2018-04-14 – 2018-04-15 (×2): 0.25 mg via ORAL
  Filled 2018-04-14 (×2): qty 1

## 2018-04-14 MED ORDER — DIPHENHYDRAMINE HCL 25 MG PO CAPS
25.0000 mg | ORAL_CAPSULE | Freq: Once | ORAL | Status: AC
  Administered 2018-04-14: 25 mg via ORAL
  Filled 2018-04-14: qty 1

## 2018-04-14 MED ORDER — MORPHINE SULFATE (PF) 4 MG/ML IV SOLN
4.0000 mg | Freq: Once | INTRAVENOUS | Status: DC
  Filled 2018-04-14: qty 1

## 2018-04-14 MED ORDER — POTASSIUM CHLORIDE CRYS ER 20 MEQ PO TBCR
40.0000 meq | EXTENDED_RELEASE_TABLET | ORAL | Status: AC
  Administered 2018-04-14: 40 meq via ORAL
  Filled 2018-04-14: qty 2

## 2018-04-14 MED ORDER — ONDANSETRON HCL 4 MG PO TABS: 4.0000 mg | ORAL_TABLET | Freq: Four times a day (QID) | ORAL | Status: DC | PRN

## 2018-04-14 MED ORDER — ACETAMINOPHEN 325 MG PO TABS: 650.0000 mg | ORAL_TABLET | Freq: Four times a day (QID) | ORAL | Status: DC | PRN

## 2018-04-14 MED ORDER — ACETAMINOPHEN 650 MG RE SUPP: 650.0000 mg | Freq: Four times a day (QID) | RECTAL | Status: DC | PRN

## 2018-04-14 MED ORDER — DIPHENHYDRAMINE HCL 50 MG/ML IJ SOLN
25.0000 mg | Freq: Once | INTRAMUSCULAR | Status: AC
  Administered 2018-04-14: 25 mg via INTRAVENOUS
  Filled 2018-04-14: qty 1

## 2018-04-14 MED ORDER — SODIUM CHLORIDE 0.9 % IV SOLN
INTRAVENOUS | Status: DC
  Administered 2018-04-14 – 2018-04-15 (×3): via INTRAVENOUS

## 2018-04-14 MED ORDER — ONDANSETRON HCL 4 MG/2ML IJ SOLN: 4.0000 mg | Freq: Four times a day (QID) | INTRAMUSCULAR | Status: DC | PRN

## 2018-04-14 MED ORDER — ALUM & MAG HYDROXIDE-SIMETH 200-200-20 MG/5ML PO SUSP
30.0000 mL | Freq: Once | ORAL | Status: AC
  Administered 2018-04-14: 30 mL via ORAL
  Filled 2018-04-14: qty 30

## 2018-04-14 MED ORDER — SODIUM CHLORIDE 0.9 % IV BOLUS
1000.0000 mL | Freq: Once | INTRAVENOUS | Status: AC
  Administered 2018-04-14: 1000 mL via INTRAVENOUS

## 2018-04-14 NOTE — H&P (Addendum)
[name] is an 22 y.o. female.   Chief Complaint: Dizziness HPI: Patient with past medical history of daily headache, syncope and episodes of near syncope presents to the emergency department with the latter.  In the last 2 days the patient has had episodes of both syncope and near syncope where she becomes very dizzy and nauseous.  She also reports strong urge to have a bowel movement.  There have been episodes in the past year where the patient has vomited and had diarrhea prior to losing consciousness.  She reports that she is always been with somebody when this happens and that she has not hurt herself when losing consciousness.  Nobody has reported any seizure-like activity with her once she has consciousness.  She admits to feeling "butterflies in her chest" but cannot remember symptoms of chest pain or shortness of breath.  She was seen in our emergency department 2 days ago and again yesterday at [facility] prior to leaving AMA.  She returns tonight due to recurrent symptoms for which the emergency department staff called the hospitalist service for further evaluation.  Past Medical History:  Diagnosis Date  . Anemia   . Anxiety   . Bladder infection, acute 01-10-2015   completed antibiotics and denies any s&s of infection (01-30-15)  . Dysrhythmia    occ irregular heart beat per pt  . GERD (gastroesophageal reflux disease)    NO MEDS  . Headache    MIGRAINES DAILY    Past Surgical History:  Procedure Laterality Date  . ADENOIDECTOMY    . CHOLECYSTECTOMY N/A 02/01/2015   Procedure: LAPAROSCOPIC CHOLECYSTECTOMY WITH INTRAOPERATIVE CHOLANGIOGRAM;  Surgeon: Robert Bellow, MD;  Location: [facility] ORS;  Service: General;  Laterality: N/A;  . TONSILLECTOMY    . WISDOM TOOTH EXTRACTION      Family History  Problem Relation Age of Onset  . Healthy Mother   . Hypertension Father   . Hyperlipidemia Father   . Cancer Maternal Aunt        breast  . CAD Paternal Grandmother        d.  39  . CAD Paternal Grandfather        d. 84   Social History:  reports that she has quit smoking. Her smoking use included cigarettes. She has a 4.00 pack-year smoking history. She has never used smokeless tobacco. She reports that she drinks alcohol. She reports that she does not use drugs.  Allergies:  Allergies  Allergen Reactions  . Penicillins Anaphylaxis and Swelling    Has patient had a PCN reaction causing immediate rash, facial/tongue/throat swelling, SOB or lightheadedness with hypotension: Yes Has patient had a PCN reaction causing severe rash involving mucus membranes or skin necrosis: No Has patient had a PCN reaction that required hospitalization No Has patient had a PCN reaction occurring within the last 10 years: Yes If all of the above answers are "NO", then [place] proceed with Cephalosporin use.  Marland Kitchen Amoxicillin      (Not in a hospital admission)  Results for orders placed or performed during the hospital encounter of 04/14/18 (from the past 48 hour(s))  Glucose, capillary     Status: Abnormal   Collection Time: 04/13/18  9:40 PM  Result Value Ref Range   Glucose-Capillary 121 (H) 70 - 99 mg/dL  Comprehensive metabolic panel     Status: Abnormal   Collection Time: 04/14/18  2:17 AM  Result Value Ref Range   Sodium 140 135 - 145 mmol/L  Potassium 3.3 (L) 3.5 - 5.1 mmol/L   Chloride 108 98 - 111 mmol/L   CO2 25 22 - 32 mmol/L   Glucose, Bld 115 (H) 70 - 99 mg/dL   BUN 12 6 - 20 mg/dL   Creatinine, Ser 0.71 0.44 - 1.00 mg/dL   Calcium 8.9 8.9 - 10.3 mg/dL   Total Protein 6.9 6.5 - 8.1 g/dL   Albumin 4.1 3.5 - 5.0 g/dL   AST 20 15 - 41 U/L   ALT 12 0 - 44 U/L   Alkaline Phosphatase 54 38 - 126 U/L   Total Bilirubin 0.5 0.3 - 1.2 mg/dL   GFR calc non Af Amer >60 >60 mL/min   GFR calc Af Amer >60 >60 mL/min    Comment: (NOTE) The eGFR has been calculated using the CKD EPI equation. This calculation has not been validated in all clinical situations. eGFR's  persistently <60 mL/min signify possible Chronic Kidney Disease.    Anion gap 7 5 - 15    Comment: Performed at [facility], [address] [place], [place] [address]  Lipase, blood     Status: Abnormal   Collection Time: 04/14/18  2:17 AM  Result Value Ref Range   Lipase 56 (H) 11 - 51 U/L    Comment: Performed at [facility], [address] [name], [place] [address]  CBC with Differential     Status: Abnormal   Collection Time: 04/14/18  2:17 AM  Result Value Ref Range   WBC 11.5 (H) 4.0 - 10.5 K/uL   RBC 4.73 3.87 - 5.11 MIL/uL   Hemoglobin 13.4 12.0 - 15.0 g/dL   HCT 41.7 36.0 - 46.0 %   MCV 88.2 80.0 - 100.0 fL   MCH 28.3 26.0 - 34.0 pg   MCHC 32.1 30.0 - 36.0 g/dL   RDW 12.6 11.5 - 15.5 %   Platelets 219 150 - 400 K/uL   nRBC 0.0 0.0 - 0.2 %   Neutrophils Relative % 61 %   Neutro Abs 7.0 1.7 - 7.7 K/uL   Lymphocytes Relative 26 %   Lymphs Abs 3.0 0.7 - 4.0 K/uL   Monocytes Relative 11 %   Monocytes Absolute 1.3 (H) 0.1 - 1.0 K/uL   Eosinophils Relative 1 %   Eosinophils Absolute 0.1 0.0 - 0.5 K/uL   Basophils Relative 0 %   Basophils Absolute 0.1 0.0 - 0.1 K/uL   Immature Granulocytes 1 %   Abs Immature Granulocytes 0.06 0.00 - 0.07 K/uL    Comment: Performed at [facility], [address] [place], [place] [address]  hCG, quantitative, pregnancy     Status: None   Collection Time: 04/14/18  2:17 AM  Result Value Ref Range   hCG, Beta Chain, Quant, S <1 <5 mIU/mL    Comment:          GEST. AGE      CONC.  (mIU/mL)   <=1 WEEK        5 - 50     2 WEEKS       50 - 500     3 WEEKS       100 - 10,000     4 WEEKS     1,000 - 30,000     5 WEEKS     3,500 - 115,000   6-8 WEEKS     12,000 - 270,000    12 WEEKS     15,000 - 220,000  FEMALE AND NON-PREGNANT FEMALE:     LESS THAN 5 mIU/mL Performed at [facility], [address] [place], [place] [address]   Brain natriuretic peptide     Status: None   Collection  Time: 04/14/18  2:17 AM  Result Value Ref Range   B Natriuretic Peptide 8.0 0.0 - 100.0 pg/mL    Comment: Performed at [facility], [address] [place], [place] [address]  Troponin I     Status: None   Collection Time: 04/14/18  2:17 AM  Result Value Ref Range   Troponin I <0.03 <0.03 ng/mL    Comment: Performed at [facility], [address] [place], [place] [address]  Urinalysis, Complete w Microscopic     Status: Abnormal   Collection Time: 04/14/18  3:16 AM  Result Value Ref Range   Color, Urine YELLOW (A) YELLOW   APPearance HAZY (A) CLEAR   Specific Gravity, Urine 1.020 1.005 - 1.030   pH 5.0 5.0 - 8.0   Glucose, UA NEGATIVE NEGATIVE mg/dL   Hgb urine dipstick NEGATIVE NEGATIVE   Bilirubin Urine NEGATIVE NEGATIVE   Ketones, ur NEGATIVE NEGATIVE mg/dL   Protein, ur NEGATIVE NEGATIVE mg/dL   Nitrite NEGATIVE NEGATIVE   Leukocytes, UA LARGE (A) NEGATIVE   RBC / HPF 0-5 0 - 5 RBC/hpf   WBC, UA 0-5 0 - 5 WBC/hpf   Bacteria, UA NONE SEEN NONE SEEN   Squamous Epithelial / LPF 11-20 0 - 5   Mucus PRESENT     Comment: Performed at [facility], [address] [place], [place] [address]   No results found.  Review of Systems  Constitutional: Negative for chills and fever.  HENT: Negative for sore throat and tinnitus.   Eyes: Negative for blurred vision and redness.  Respiratory: Negative for cough and shortness of breath.   Cardiovascular: Positive for palpitations. Negative for chest pain, orthopnea and PND.  Gastrointestinal: Positive for abdominal pain and nausea. Negative for diarrhea and vomiting.  Genitourinary: Negative for dysuria, frequency and urgency.  Musculoskeletal: Negative for joint pain and myalgias.  Skin: Negative for rash.       No lesions  Neurological: Positive for loss of consciousness. Negative for speech change, focal weakness and weakness.  Endo/Heme/Allergies: Does not bruise/bleed easily.       No temperature  intolerance  Psychiatric/Behavioral: Negative for depression and suicidal ideas.    Blood pressure 111/70, pulse 75, temperature 98.2 F (36.8 C), temperature source Oral, resp. rate 18, height 5' 1"  (1.549 m), weight 61.2 kg, last menstrual period 03/23/2018, SpO2 100 %. Physical Exam  Vitals reviewed. Constitutional: She is oriented to person, place, and time. She appears well-developed and well-nourished. No distress.  HENT:  Head: Normocephalic and atraumatic.  Mouth/Throat: Oropharynx is clear and moist.  Eyes: Pupils are equal, round, and reactive to light. Conjunctivae and EOM are normal. No scleral icterus.  Neck: Normal range of motion. Neck supple. No JVD present. No tracheal deviation present. No thyromegaly present.  Cardiovascular: Normal rate, regular rhythm and normal heart sounds. Exam reveals no gallop and no friction rub.  No murmur heard. Respiratory: Effort normal and breath sounds normal.  GI: Soft. Bowel sounds are normal. She exhibits no distension. There is no tenderness.  Genitourinary:  Genitourinary Comments: Deferred  Musculoskeletal: Normal range of motion. She exhibits no edema.  Lymphadenopathy:    She has no cervical adenopathy.  Neurological: She is alert and oriented to person, place, and time.  No cranial nerve deficit. She exhibits normal muscle tone.  Skin: Skin is warm and dry. No rash noted. No erythema.  Psychiatric: She has a normal mood and affect. Her behavior is normal. Judgment and thought content normal.     Assessment/Plan This is a 22 year old female admitted for syncope. 1.  Syncope: Differential diagnosis includes POTS or SVT.  Must also rule out neurogenic syncope..  Consult cardiology and neurology. 2.  Hypokalemia: Replete potassium. 3.  DVT prophylaxis: Lovenox 4.  GI prophylaxis: None The patient is a full code.  Time spent on admission orders and patient care approximately 45 minutes  Harrie Foreman, MD 04/14/2018, 8:15  AM

## 2018-04-14 NOTE — Clinical Social Work Note (Signed)
CSW received consult that patient will need assistance with medications, case manager can assist with this.  CSW signing off please reconsult if social work needs arise.  [name]. Hassan Rowan, MSW, Theresia Majors [phone]  04/14/2018 4:57 PM

## 2018-04-14 NOTE — ED Provider Notes (Signed)
[facility] Emergency Department Provider Note  ____________________________________________   First MD Initiated Contact with Patient 04/14/18 0148     (approximate)  I have reviewed the triage vital signs and the nursing notes.   HISTORY  Chief Complaint Abdominal Pain; Diarrhea; and Dizziness   HPI [name] is a 22 y.o. female who self presents to the emergency department after multiple syncopal episodes for the past several days.  I am very familiar with the patient is actually saw her 2 days ago on November 3.  At that point she had not syncopized.  She is had recurrent symptoms ever since then.  She has episodes where she feels lightheaded nauseated feels like she needs to vomit and needs to defecate and the symptoms can last up to 3 hours at a time.  When I saw her her work-up was reassuring and it seemed to be related to eating heavy greasy meals and we felt that it could be related to reactive hypoglycemia.  Yesterday she had a recurrent episode and went to [facility] for further evaluation.  While in triage she actually syncopized completely which had not happened to her before.  She was placed in the hallway bed and after blood was drawn she felt that she was not being attended to so she eloped and drove home.  Once home she had another episode where she felt like her heart was racing she felt lightheaded and she passed out which prompted the return visit to our emergency department today.  She has no family history of sudden cardiac death.  Her symptoms are intermittent and severe.  Nothing in particular seems to make them better or worse.  They are associated with some abdominal pain and nausea.  She is currently asymptomatic although clearly frustrated.    Past Medical History:  Diagnosis Date  . Anemia   . Anxiety   . Bladder infection, acute 01-10-2015   completed antibiotics and denies any s&s of infection (01-30-15)  . Dysrhythmia    occ irregular heart beat per pt  . GERD (gastroesophageal reflux disease)    NO MEDS  . Headache    MIGRAINES DAILY    Patient Active Problem List   Diagnosis Date Noted  . Syncope 04/14/2018  . Periumbilical abdominal pain 03/12/2015  . Gallstones 01/29/2015    Past Surgical History:  Procedure Laterality Date  . ADENOIDECTOMY    . CHOLECYSTECTOMY N/A 02/01/2015   Procedure: LAPAROSCOPIC CHOLECYSTECTOMY WITH INTRAOPERATIVE CHOLANGIOGRAM;  Surgeon: Earline Mayotte, MD;  Location: [facility] ORS;  Service: General;  Laterality: N/A;  . TONSILLECTOMY    . WISDOM TOOTH EXTRACTION      Prior to Admission medications   Medication Sig Start Date End Date Taking? Authorizing Provider  acetaminophen (TYLENOL) 325 MG tablet Take 650 mg by mouth every 6 (six) hours as needed for mild pain, fever or headache.   Yes [provider]  PRENATAL 28-0.8 MG TABS 1 tablet daily. Patient not taking: Reported on 04/14/2018 01/18/18   Tommie Sams, DO    Allergies Penicillins and Amoxicillin  Family History  Problem Relation Age of Onset  . Healthy Mother   . Hypertension Father   . Hyperlipidemia Father   . Cancer Maternal Aunt        breast    Social History Social History   Tobacco Use  . Smoking status: Former Smoker    Packs/day: 0.50    Years: 8.00    Pack  years: 4.00    Types: Cigarettes  . Smokeless tobacco: Never Used  Substance Use Topics  . Alcohol use: Yes    Alcohol/week: 0.0 standard drinks    Comment: seldom  . Drug use: No    Review of Systems Constitutional: No fever/chills Eyes: No visual changes. ENT: No sore throat. Cardiovascular: Denies chest pain.  Positive for palpitations Respiratory: Denies shortness of breath. Gastrointestinal: Positive for abdominal pain.  Positive for nausea, no vomiting.  No diarrhea.  No constipation. Genitourinary: Negative for dysuria. Musculoskeletal: Negative for back pain. Skin: Negative for rash. Neurological:  Negative for headaches, focal weakness or numbness.   ____________________________________________   PHYSICAL EXAM:  VITAL SIGNS: ED Triage Vitals  Enc Vitals Group     BP 04/13/18 2135 136/74     Pulse Rate 04/13/18 2135 93     Resp 04/13/18 2135 16     Temp 04/13/18 2135 98.2 F (36.8 C)     Temp Source 04/13/18 2135 Oral     SpO2 04/13/18 2135 100 %     Weight 04/13/18 2136 135 lb (61.2 kg)     Height 04/13/18 2136 5\' 1"  (1.549 m)     Head Circumference --      Peak Flow --      Pain Score 04/13/18 2135 8     Pain Loc --      Pain Edu? --      Excl. in GC? --     Constitutional: Alert and oriented x4 tearful and frustrated appearing although nontoxic no diaphoresis Eyes: PERRL EOMI. Head: Atraumatic. Nose: No congestion/rhinnorhea. Mouth/Throat: No trismus Neck: No stridor.  Able lie completely flat with no JVD Cardiovascular: Normal rate, regular rhythm. Grossly normal heart sounds.  Good peripheral circulation. Respiratory: Normal respiratory effort.  No retractions. Lungs CTAB and moving good air Gastrointestinal: Soft nontender no peritonitis Musculoskeletal: No lower extremity edema   Neurologic:  Normal speech and language. No gross focal neurologic deficits are appreciated. Skin:  Skin is warm, dry and intact. No rash noted. Psychiatric: Mildly anxious appearing   ____________________________________________   DIFFERENTIAL includes but not limited to  Cardiogenic syncope, vasovagal syncope, dehydration, hypoglycemia, valvular issue ____________________________________________   LABS (all labs ordered are listed, but only abnormal results are displayed)  Labs Reviewed  GLUCOSE, CAPILLARY - Abnormal; Notable for the following components:      Result Value   Glucose-Capillary 121 (*)    All other components within normal limits  COMPREHENSIVE METABOLIC PANEL - Abnormal; Notable for the following components:   Potassium 3.3 (*)    Glucose, Bld 115 (*)     All other components within normal limits  LIPASE, BLOOD - Abnormal; Notable for the following components:   Lipase 56 (*)    All other components within normal limits  CBC WITH DIFFERENTIAL/PLATELET - Abnormal; Notable for the following components:   WBC 11.5 (*)    Monocytes Absolute 1.3 (*)    All other components within normal limits  URINALYSIS, COMPLETE (UACMP) WITH MICROSCOPIC - Abnormal; Notable for the following components:   Color, Urine YELLOW (*)    APPearance HAZY (*)    Leukocytes, UA LARGE (*)    All other components within normal limits  HCG, QUANTITATIVE, PREGNANCY  BRAIN NATRIURETIC PEPTIDE  TROPONIN I    Lab work reviewed by me from this visit and previous visits with no acute disease noted.  I appreciate she has large leukocytes in her urine however no bacteria and no dysuria  __________________________________________  EKG  ED ECG REPORT I, Merrily Brittle, the attending physician, personally viewed and interpreted this ECG.  Date: 04/14/2018 EKG Time:  Rate: 79 Rhythm: normal sinus rhythm QRS Axis: normal Intervals: normal ST/T Wave abnormalities: normal Narrative Interpretation: no evidence of acute ischemia.  No concerning signs for ventricular dysrhythmia specifically normal intervals no blocks no signs of Brugada  ____________________________________________  RADIOLOGY   ____________________________________________   PROCEDURES  Procedure(s) performed: no  Procedures  Critical Care performed: no  ____________________________________________   INITIAL IMPRESSION / ASSESSMENT AND PLAN / ED COURSE  Pertinent labs & imaging results that were available during my care of the patient were reviewed by me and considered in my medical decision making (see chart for details).   As part of my medical decision making, I reviewed the following data within the electronic MEDICAL RECORD NUMBER History obtained from family if available, nursing notes,  old chart and ekg, as well as notes from prior ED visits.  On arrival the patient is somewhat anxious although overall relatively well-appearing.  This is her third ER presentation in 3 days for similar events although today is clearly worse as she has syncopized multiple times.  She has no cardiac sounding murmur and her EKG is reassuring however given her recurrent symptoms I do believe she requires at least 24 hours of telemetry and inpatient admission to attempt to capture any worsening arrhythmias.  The patient verbalizes understanding and agreement with inpatient admission.  We have attempted outpatient management and this is clearly failed.  I discussed with the hospitalist Dr. Sheryle Hail who has graciously agreed to admit the patient to his service.      ____________________________________________   FINAL CLINICAL IMPRESSION(S) / ED DIAGNOSES  Final diagnoses:  Syncope, unspecified syncope type  Dehydration      NEW MEDICATIONS STARTED DURING THIS VISIT:  New Prescriptions   No medications on file     Note:  This document was prepared using Dragon voice recognition software and Kabel include unintentional dictation errors.     Merrily Brittle, MD 04/14/18 [phone]

## 2018-04-14 NOTE — ED Notes (Signed)
ED TO INPATIENT HANDOFF REPORT  Name/Age/Gender [name] 22 y.o. female  Code Status   Home/SNF/Other Home  Chief Complaint weakness and syncope  Level of Care/Admitting Diagnosis ED Disposition    ED Disposition Condition [place]: [place] [[id number]]  Level of Care: Med-Surg [16]  Diagnosis: Syncope [093267]  Admitting Physician: Harrie Foreman [[id number]]  Attending Physician: Harrie Foreman [phone]  PT Class (Do Not Modify): Observation [104]  PT Acc Code (Do Not Modify): Observation [10022]       Medical History Past Medical History:  Diagnosis Date  . Anemia   . Anxiety   . Bladder infection, acute 01-10-2015   completed antibiotics and denies any s&s of infection (01-30-15)  . Dysrhythmia    occ irregular heart beat per pt  . GERD (gastroesophageal reflux disease)    NO MEDS  . Headache    MIGRAINES DAILY    Allergies Allergies  Allergen Reactions  . Penicillins Anaphylaxis and Swelling    Has patient had a PCN reaction causing immediate rash, facial/tongue/throat swelling, SOB or lightheadedness with hypotension: Yes Has patient had a PCN reaction causing severe rash involving mucus membranes or skin necrosis: No Has patient had a PCN reaction that required hospitalization No Has patient had a PCN reaction occurring within the last 10 years: Yes If all of the above answers are "NO", then [place] proceed with Cephalosporin use.  Marland Kitchen Amoxicillin     IV Location/Drains/Wounds Patient Lines/Drains/Airways Status   Active Line/Drains/Airways    Name:   Placement date:   Placement time:   Site:   Days:   Peripheral IV 04/14/18 Left Antecubital   04/14/18    0225    Antecubital   less than 1   Incision (Closed) 02/01/15 Abdomen   02/01/15    1458     1168          Labs/Imaging Results for orders placed or performed during the hospital encounter of 04/14/18 (from the past 48 hour(s))  Glucose,  capillary     Status: Abnormal   Collection Time: 04/13/18  9:40 PM  Result Value Ref Range   Glucose-Capillary 121 (H) 70 - 99 mg/dL  Comprehensive metabolic panel     Status: Abnormal   Collection Time: 04/14/18  2:17 AM  Result Value Ref Range   Sodium 140 135 - 145 mmol/L   Potassium 3.3 (L) 3.5 - 5.1 mmol/L   Chloride 108 98 - 111 mmol/L   CO2 25 22 - 32 mmol/L   Glucose, Bld 115 (H) 70 - 99 mg/dL   BUN 12 6 - 20 mg/dL   Creatinine, Ser 0.71 0.44 - 1.00 mg/dL   Calcium 8.9 8.9 - 10.3 mg/dL   Total Protein 6.9 6.5 - 8.1 g/dL   Albumin 4.1 3.5 - 5.0 g/dL   AST 20 15 - 41 U/L   ALT 12 0 - 44 U/L   Alkaline Phosphatase 54 38 - 126 U/L   Total Bilirubin 0.5 0.3 - 1.2 mg/dL   GFR calc non Af Amer >60 >60 mL/min   GFR calc Af Amer >60 >60 mL/min    Comment: (NOTE) The eGFR has been calculated using the CKD EPI equation. This calculation has not been validated in all clinical situations. eGFR's persistently <60 mL/min signify possible Chronic Kidney Disease.    Anion gap 7 5 - 15    Comment: Performed at [facility], [address],  [place], [place] [address]  Lipase, blood     Status: Abnormal   Collection Time: 04/14/18  2:17 AM  Result Value Ref Range   Lipase 56 (H) 11 - 51 U/L    Comment: Performed at [facility], [address] [place], [place] [address]  CBC with Differential     Status: Abnormal   Collection Time: 04/14/18  2:17 AM  Result Value Ref Range   WBC 11.5 (H) 4.0 - 10.5 K/uL   RBC 4.73 3.87 - 5.11 MIL/uL   Hemoglobin 13.4 12.0 - 15.0 g/dL   HCT 41.7 36.0 - 46.0 %   MCV 88.2 80.0 - 100.0 fL   MCH 28.3 26.0 - 34.0 pg   MCHC 32.1 30.0 - 36.0 g/dL   RDW 12.6 11.5 - 15.5 %   Platelets 219 150 - 400 K/uL   nRBC 0.0 0.0 - 0.2 %   Neutrophils Relative % 61 %   Neutro Abs 7.0 1.7 - 7.7 K/uL   Lymphocytes Relative 26 %   Lymphs Abs 3.0 0.7 - 4.0 K/uL   Monocytes Relative 11 %   Monocytes Absolute 1.3 (H) 0.1 - 1.0 K/uL   Eosinophils  Relative 1 %   Eosinophils Absolute 0.1 0.0 - 0.5 K/uL   Basophils Relative 0 %   Basophils Absolute 0.1 0.0 - 0.1 K/uL   Immature Granulocytes 1 %   Abs Immature Granulocytes 0.06 0.00 - 0.07 K/uL    Comment: Performed at [facility], [address] [place], [place] [address]  hCG, quantitative, pregnancy     Status: None   Collection Time: 04/14/18  2:17 AM  Result Value Ref Range   hCG, Beta Chain, Quant, S <1 <5 mIU/mL    Comment:          GEST. AGE      CONC.  (mIU/mL)   <=1 WEEK        5 - 50     2 WEEKS       50 - 500     3 WEEKS       100 - 10,000     4 WEEKS     1,000 - 30,000     5 WEEKS     3,500 - 115,000   6-8 WEEKS     12,000 - 270,000    12 WEEKS     15,000 - 220,000        FEMALE AND NON-PREGNANT FEMALE:     LESS THAN 5 mIU/mL Performed at [facility], [address] [place], [place] [address]   Brain natriuretic peptide     Status: None   Collection Time: 04/14/18  2:17 AM  Result Value Ref Range   B Natriuretic Peptide 8.0 0.0 - 100.0 pg/mL    Comment: Performed at [facility], [address] [place], [place] [address]  Troponin I     Status: None   Collection Time: 04/14/18  2:17 AM  Result Value Ref Range   Troponin I <0.03 <0.03 ng/mL    Comment: Performed at [facility], [address] [place], [place] [address]  Urinalysis, Complete w Microscopic     Status: Abnormal   Collection Time: 04/14/18  3:16 AM  Result Value Ref Range   Color, Urine YELLOW (A) YELLOW   APPearance HAZY (A) CLEAR   Specific Gravity, Urine 1.020 1.005 - 1.030   pH 5.0 5.0 - 8.0   Glucose, UA NEGATIVE NEGATIVE mg/dL  Hgb urine dipstick NEGATIVE NEGATIVE   Bilirubin Urine NEGATIVE NEGATIVE   Ketones, ur NEGATIVE NEGATIVE mg/dL   Protein, ur NEGATIVE NEGATIVE mg/dL   Nitrite NEGATIVE NEGATIVE   Leukocytes, UA LARGE (A) NEGATIVE   RBC / HPF 0-5 0 - 5 RBC/hpf   WBC, UA 0-5 0 - 5 WBC/hpf   Bacteria, UA NONE SEEN NONE SEEN    Squamous Epithelial / LPF 11-20 0 - 5   Mucus PRESENT     Comment: Performed at [facility], [address] [place], [place] [address]   No results found.  Pending Labs [employer] (From admission, onward)    Start     Ordered   Signed and Held  Creatinine, serum  (enoxaparin (LOVENOX)    CrCl >/= 30 ml/min)  Weekly,   R    Comments:  while on enoxaparin therapy    Signed and Held   Signed and Held  TSH  Add-on,   R     Signed and Held   Signed and Held  Hemoglobin A1c  Add-on,   R     Signed and Held          Vitals/Pain Today's Vitals   04/14/18 1100 04/14/18 1132 04/14/18 1445 04/14/18 1500  BP: 103/67  115/82 110/72  Pulse: 81  81 84  Resp: _0 Temp:      TempSrc:      SpO2: 99%  100% 99%  Weight:      Height:      PainSc:  7       Isolation Precautions No active isolations  Medications Medications  morphine 4 MG/ML injection 4 mg (4 mg Intravenous Refused 04/14/18 0435)  ondansetron (ZOFRAN) injection 4 mg (4 mg Intravenous Refused 04/14/18 0436)  sodium chloride 0.9 % bolus 1,000 mL (0 mLs Intravenous Stopped 04/14/18 0414)  alum & mag hydroxide-simeth (MAALOX/MYLANTA) 200-200-20 MG/5ML suspension 30 mL (30 mLs Oral Given 04/14/18 0443)  potassium chloride SA (K-DUR,KLOR-CON) CR tablet 40 mEq (40 mEq Oral Given 04/14/18 0657)  diphenhydrAMINE (BENADRYL) injection 25 mg (25 mg Intravenous Given 04/14/18 0912)  diphenhydrAMINE (BENADRYL) capsule 25 mg (25 mg Oral Given 04/14/18 1444)    Mobility walks

## 2018-04-14 NOTE — Progress Notes (Signed)
Talked to Dr. Luberta Mutter for patient's request to have Benadryl to help her sleep tonight, order given. RN will continue to monitor.

## 2018-04-14 NOTE — Progress Notes (Signed)
Talked to Dr. Caryn Bee about patient's still complaints of feeling anxious even after taking benadryl. Patient is requesting to take Ativan which she also takes at home before, order given. RN will continue to monitor.

## 2018-04-14 NOTE — Progress Notes (Signed)
Patient ID: [name], female   DOB: 1996/04/21, 22 y.o.   MRN: [id number] patient seen chart reviewed. She feels better she.. She is a little queasy in her stomach. Wants to try Benadryl. She ate chicken nuggets from [employer] and the emergency room. No vomiting. Vitals stable. Neuro- and cardiology consultation pending.

## 2018-04-14 NOTE — Progress Notes (Addendum)
Talked to Dr. Luberta Mutter about patient's sudden feeling of "she's going to passed out" HR went to 130's non sustaining, per MD possible POTS, waiting for cardiology to see patient. BP was 131/86, other VSS. She was also complaining of mid sternal chest pressure and was complaining of nausea, asked if she would want a medication to relieve the pain and nausea, patient refused. Patient has IVF running at 125 ml/hr. Also asked if she would want something for anxiety as this [place] also be a panic attack, patient took benadryl and would try other medication if that will not help her. She also reported that she felt like she has the urge to have bowel movement. Order to continue to monitor and to place compression stocking on her. RN will continue to monitor.

## 2018-04-14 NOTE — Consult Note (Signed)
Reason for Consult:Syncope Referring Physician: Arnaldo Natal, MD  CC: Nausea, Abdominal pain,near syncope  HPI: [name] is an 22 y.o. female with pertinent history of syncopal episodes since 2015, anxiety with panic attack disorder,chronic persistent daily headaches, migraine headaches, depression presenting to the ED on 04/14/2018 with complaints of nausea and abdominal pain. She was recently evaluated in the ED on 04/11/2018 with similar symptoms associated with dizziness, posterior neck pain,and near syncopal episode that started while asleep at night. She was found to be hypoglycemic therefore was treated and discharged. She presented again on 04/13/2018  to [facility] ED with similar episode but this time she had a witnessed syncopal episode by ED staff. Patient state that she left AMA prior to D/C due to long wait time.  She came back to [facility] ED today due to worsening symptoms. Patient state that she has long standing history of syncope associated with nausea and vomiting, feeling cold or clammy, blurry vision, palpitations, and shortness of breath. She denies loss of bladder control or tongue biting. However she does endorse feeling of bowel urgency when she is about to have an episode of syncope. Patient also denies associated altered sensorium, speech abnormality, cranial nerve deficit, focal motor or sensory deficits, diplopia, ipsilateral or contralateral paralysis/weakness, numbness or tingling, involuntary movements, tremor. No history of head injury or trauma. Reports they were previously at a frequency of about 1-2 per year but has now had three in the past week.  She has had extensive work up for syncope and has been evaluated by [facility] neurology, cardiology, psychiatry and endocrinology with no indentifiable cause of her symptoms. Her most recent MRI brain was in 07/02/2016 which was unremarkable with no acute intracranial abnormality noted. She has had multiple EEGs which did not show  abnormality or evidence of epileptiform discharges. Patient has been followed by cardiology as well with extensive cardiac work up in the past with no identifiable cause of her syncope.  Past Medical History:  Diagnosis Date  . Anemia   . Anxiety   . Bladder infection, acute 01-10-2015   completed antibiotics and denies any s&s of infection (01-30-15)  . Dysrhythmia    occ irregular heart beat per pt  . GERD (gastroesophageal reflux disease)    NO MEDS  . Headache    MIGRAINES DAILY    Past Surgical History:  Procedure Laterality Date  . ADENOIDECTOMY    . CHOLECYSTECTOMY N/A 02/01/2015   Procedure: LAPAROSCOPIC CHOLECYSTECTOMY WITH INTRAOPERATIVE CHOLANGIOGRAM;  Surgeon: Earline Mayotte, MD;  Location: [facility] ORS;  Service: General;  Laterality: N/A;  . TONSILLECTOMY    . WISDOM TOOTH EXTRACTION      Family History  Problem Relation Age of Onset  . Healthy Mother   . Hypertension Father   . Hyperlipidemia Father   . Cancer Maternal Aunt        breast  . CAD Paternal Grandmother        d. 53  . CAD Paternal Grandfather        d. 30    Social History:  reports that she has quit smoking. Her smoking use included cigarettes. She has a 4.00 pack-year smoking history. She has never used smokeless tobacco. She reports that she drinks alcohol. She reports that she does not use drugs.  Allergies  Allergen Reactions  . Penicillins Anaphylaxis and Swelling    Has patient had a PCN reaction causing immediate rash, facial/tongue/throat swelling, SOB or lightheadedness with hypotension: Yes Has patient  had a PCN reaction causing severe rash involving mucus membranes or skin necrosis: No Has patient had a PCN reaction that required hospitalization No Has patient had a PCN reaction occurring within the last 10 years: Yes If all of the above answers are "NO", then Siple proceed with Cephalosporin use.  Marland Kitchen Amoxicillin     Medications:  I have reviewed the patient's current  medications.  rior to Admission:  (Not in a hospital admission) Prior to Admission medications   Medication Sig Start Date End Date Taking? Authorizing Provider  acetaminophen (TYLENOL) 325 MG tablet Take 650 mg by mouth every 6 (six) hours as needed for mild pain, fever or headache.   Yes [provider]  PRENATAL 28-0.8 MG TABS 1 tablet daily. Patient not taking: Reported on 04/14/2018 01/18/18   Tommie Sams, DO    Scheduled: .  morphine injection  4 mg Intravenous Once  . ondansetron (ZOFRAN) IV  4 mg Intravenous Once    ROS: History obtained from the patient   General ROS: negative for - chills, fatigue, fever, night sweats, weight gain or weight loss Psychological ROS: negative for - behavioral disorder, hallucinations, memory difficulties, mood swings or suicidal ideation. Positive for depression and anxiety. Ophthalmic ROS: negative for - double vision, eye pain or loss of vision. Positive for blurry vision. ENT ROS: negative for - epistaxis, nasal discharge, oral lesions, sore throat, tinnitus or vertigo Allergy and Immunology ROS: negative for - hives or itchy/watery eyes Hematological and Lymphatic ROS: negative for - bleeding problems, bruising or swollen lymph nodes Endocrine ROS: negative for - galactorrhea, hair pattern changes, polydipsia/polyuria or temperature intolerance Respiratory ROS: negative for - cough, hemoptysis, shortness of breath or wheezing Cardiovascular ROS: negative for - chest pain, dyspnea on exertion, edema or irregular heartbeat. Positive for syncope, palpitations. Gastrointestinal ROS: negative for - diarrhea, hematemesis, stool incontinence. Positive for abdominal pain,nausea, vomiting, and bowel urgency. Genito-Urinary ROS: negative for - dysuria, hematuria, incontinence. Positive for urinary frequency/urgency Musculoskeletal ROS: negative for - joint swelling or muscular weakness Neurological ROS: as noted in HPI Dermatological ROS:  negative for rash and skin lesion changes  Physical Examination: Blood pressure 120/73, pulse 72, temperature 98.2 F (36.8 C), temperature source Oral, resp. rate 18, height 5\' 1"  (1.549 m), weight 61.2 kg, last menstrual period 03/23/2018, SpO2 100 %.  HEENT-  Normocephalic, no lesions, without obvious abnormality.  Normal external eye and conjunctiva.  Normal TM's bilaterally.  Normal auditory canals and external ears. Normal external nose, mucus membranes and septum.  Normal pharynx. Cardiovascular- S1, S2 normal, pulses palpable throughout   Lungs- chest clear, no wheezing, rales, normal symmetric air entry Abdomen- soft, non-tender; bowel sounds normal; no masses,  no organomegaly Extremities- no edema Lymph-no adenopathy palpable Musculoskeletal-no joint tenderness, deformity or swelling Skin-warm and dry, no hyperpigmentation, vitiligo, or suspicious lesions  Neurological Exam   Mental Status: Alert, oriented, thought content appropriate.  Speech fluent without evidence of aphasia.  Able to follow 3 step commands without difficulty. Attention span and concentration seemed appropriate  Cranial Nerves: II: Discs flat bilaterally; Visual fields grossly normal, pupils equal, round, reactive to light and accommodation III,IV, VI: ptosis not present, extra-ocular motions intact bilaterally V,VII: smile symmetric, facial light touch sensation intact VIII: hearing normal bilaterally IX,X: gag reflex present XI: bilateral shoulder shrug XII: midline tongue extension Motor: Right :  Upper extremity   5/5 Without pronator drift      Left: Upper extremity   5/5 without pronator drift  Right:   Lower extremity   5/5                                          Left: Lower extremity   5/5 Tone and bulk:normal tone throughout; no atrophy noted Sensory: Pinprick and light touch intact bilaterally Deep Tendon Reflexes: 2+ and symmetric throughout Plantars: Right: downgoing                              Left: downgoing Cerebellar: Finger-to-nose testing intact bilaterally. Heel to shin testing normal bilaterally Gait: not tested due to safety concerns  Data Reviewed  Laboratory Studies:   Basic Metabolic Panel: Recent Labs  Lab 04/11/18 0407 04/13/18 1428 04/14/18 0217  NA 141 139 140  K 3.3* 3.4* 3.3*  CL 109 107 108  CO2 26 24 25   GLUCOSE 107* 103* 115*  BUN 13 12 12   CREATININE 0.73 0.74 0.71  CALCIUM 9.0 9.1 8.9    Liver Function Tests: Recent Labs  Lab 04/14/18 0217  AST 20  ALT 12  ALKPHOS 54  BILITOT 0.5  PROT 6.9  ALBUMIN 4.1   Recent Labs  Lab 04/14/18 0217  LIPASE 56*   No results for input(s): AMMONIA in the last 168 hours.  CBC: Recent Labs  Lab 04/11/18 0407 04/13/18 1428 04/14/18 0217  WBC 10.5 10.0 11.5*  NEUTROABS  --   --  7.0  HGB 12.8 13.5 13.4  HCT 40.8 44.5 41.7  MCV 89.3 91.0 88.2  PLT 212 232 219    Cardiac Enzymes: Recent Labs  Lab 04/14/18 0217  TROPONINI <0.03    BNP: Invalid input(s): POCBNP  CBG: Recent Labs  Lab 04/13/18 1436 04/13/18 2140  GLUCAP 113* 121*    Microbiology: Results for orders placed or performed during the hospital encounter of 01/24/18  Urine Culture     Status: None   Collection Time: 01/24/18  5:12 PM  Result Value Ref Range Status   Specimen Description   Final    URINE, RANDOM Performed at [facility], [address], [place], [place] [address]    Special Requests   Final    NONE Performed at [facility], [address], [place], [place] [address]    Culture   Final    NO GROWTH Performed at [facility], [address], [place], [place] [address]    Report Status 01/26/2018 FINAL  Final    Coagulation Studies: No results for input(s): LABPROT, INR in the last 72 hours.  Urinalysis:  Recent Labs  Lab 04/13/18 1815 04/14/18 0316  COLORURINE YELLOW YELLOW*  LABSPEC 1.011 1.020  PHURINE 8.0 5.0  GLUCOSEU NEGATIVE NEGATIVE  HGBUR  NEGATIVE NEGATIVE  BILIRUBINUR NEGATIVE NEGATIVE  KETONESUR NEGATIVE NEGATIVE  PROTEINUR NEGATIVE NEGATIVE  NITRITE NEGATIVE NEGATIVE  LEUKOCYTESUR NEGATIVE LARGE*    Lipid Panel:  No results found for: CHOL, TRIG, HDL, CHOLHDL, VLDL, LDLCALC  HgbA1C: No results found for: HGBA1C  Urine Drug Screen:      Component Value Date/Time   LABOPIA POSITIVE 09/25/2014 2225   COCAINSCRNUR NEGATIVE 09/25/2014 2225   LABBENZ NEGATIVE 09/25/2014 2225   AMPHETMU NEGATIVE 09/25/2014 2225   THCU NEGATIVE 09/25/2014 2225   LABBARB NEGATIVE 09/25/2014 2225    Alcohol Level: No results for input(s): ETH in the last 168 hours.  Other results: EKG: normal EKG, normal sinus rhythm, unchanged from previous tracings. Vent. rate 79 BPM PR interval * ms QRS duration 94 ms QT/QTc 375/430 ms P-R-T axes 71 98 48  Imaging: No results found.  Patient seen and examined.  Clinical course and management discussed.  Necessary edits performed.  I agree with the above.  Assessment and plan of care developed and discussed below.  Assessment:  22 y.o female with pertinent history of syncopal episodes since 2015, anxiety with panic attack disorder,chronic persistent daily headaches, migraine headaches, depression presenting to the ED on 04/14/2018 with complaints of nausea and abdominal pain. Etiology unclear. She has had extensive work up in the past for syncope with associated symptoms with no identifiable cause of her symptoms.  Followed by neurology on an outpatient basis.  Plan: 1. Check orthostatic vitals 2. Recommend compression stockings 3. Patient unable to drive, operate heavy machinery, perform activities at heights and participate in water activities until release by outpatient physician. 4. Patient to follow up with her outpatient neurologist and psychiatrist.  No need to repeat imaging at this time.   This patient was staffed with Dr. Verlon Au, Thad Ranger who personally evaluated patient, reviewed  documentation and agreed with assessment and plan of care as above.  Webb Silversmith, DNP, FNP-BC Board certified Nurse Practitioner Neurology Department  04/14/2018, 11:03 AM   Thana Farr, MD Neurology [phone]  04/14/2018  7:20 PM

## 2018-04-14 NOTE — ED Notes (Signed)
Pt informed of bed assignment. Pt given a bag for belongings.

## 2018-04-15 ENCOUNTER — Encounter: Payer: Self-pay | Admitting: Physician Assistant

## 2018-04-15 DIAGNOSIS — K909 Intestinal malabsorption, unspecified: Secondary | ICD-10-CM

## 2018-04-15 DIAGNOSIS — I479 Paroxysmal tachycardia, unspecified: Secondary | ICD-10-CM

## 2018-04-15 DIAGNOSIS — F419 Anxiety disorder, unspecified: Secondary | ICD-10-CM

## 2018-04-15 DIAGNOSIS — R55 Syncope and collapse: Secondary | ICD-10-CM

## 2018-04-15 DIAGNOSIS — R197 Diarrhea, unspecified: Secondary | ICD-10-CM

## 2018-04-15 LAB — MAGNESIUM: Magnesium: 1.8 mg/dL (ref 1.7–2.4)

## 2018-04-15 MED ORDER — LORAZEPAM 0.5 MG PO TABS: 0.2500 mg | ORAL_TABLET | Freq: Three times a day (TID) | ORAL | 0 refills | Status: DC | PRN

## 2018-04-15 MED ORDER — POTASSIUM CHLORIDE CRYS ER 20 MEQ PO TBCR: 20.0000 meq | EXTENDED_RELEASE_TABLET | Freq: Two times a day (BID) | ORAL | Status: DC

## 2018-04-15 MED ORDER — POTASSIUM CHLORIDE CRYS ER 20 MEQ PO TBCR
40.0000 meq | EXTENDED_RELEASE_TABLET | Freq: Once | ORAL | Status: AC
  Administered 2018-04-15: 40 meq via ORAL
  Filled 2018-04-15: qty 2

## 2018-04-15 MED ORDER — POTASSIUM CHLORIDE CRYS ER 20 MEQ PO TBCR: 40.0000 meq | EXTENDED_RELEASE_TABLET | Freq: Once | ORAL | Status: DC

## 2018-04-15 MED ORDER — POTASSIUM CHLORIDE CRYS ER 10 MEQ PO TBCR: 10.0000 meq | EXTENDED_RELEASE_TABLET | Freq: Once | ORAL | Status: DC

## 2018-04-15 MED ORDER — POTASSIUM CHLORIDE ER 20 MEQ PO TBCR: 20.0000 meq | EXTENDED_RELEASE_TABLET | Freq: Every day | ORAL | 0 refills | Status: DC

## 2018-04-15 MED ORDER — POTASSIUM CHLORIDE ER 20 MEQ PO TBCR: 10.0000 meq | EXTENDED_RELEASE_TABLET | Freq: Every day | ORAL | 0 refills | Status: DC
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
## 2018-04-15 NOTE — Discharge Summary (Addendum)
[facility] -  at [facility]   PATIENT NAME: [name]    MR#:  [id number]  DATE OF BIRTH:  [birth year]  DATE OF ADMISSION:  04/14/2018 ADMITTING PHYSICIAN: Arnaldo Natal, MD  DATE OF DISCHARGE: 04/15/2018  PRIMARY CARE PHYSICIAN: [facility]    ADMISSION DIAGNOSIS:  Dehydration [E86.0] Syncope, unspecified syncope type [R55]  DISCHARGE DIAGNOSIS:  Recurrent Syncope etiology unclear suspected due to Cardiogenic syncope Chronic anxiety/ Panic attacks Mild hypokalemia SECONDARY DIAGNOSIS:   Past Medical History:  Diagnosis Date  . Anemia   . Anxiety   . Bladder infection, acute 01-10-2015   completed antibiotics and denies any s&s of infection (01-30-15)  . Dysrhythmia    occ irregular heart beat per pt  . GERD (gastroesophageal reflux disease)    NO MEDS  . Headache    MIGRAINES DAILY    HOSPITAL COURSE:    [name] is an 22 y.o. female with pertinent history of syncopal episodes since 2015, anxiety with panic attack disorder,chronic persistent daily headaches, migraine headaches, depression presenting to the ED on 04/14/2018 with complaints of nausea and abdominal pain. She was recently evaluated in the ED on 04/11/2018 with similar symptoms associated with dizziness, posterior neck pain,and near syncopal episode that started while asleep at night.  * Syncopal spells recurrent ?cardiogenic syncope, anxiety --In sinus rhythm. -Orthostatic vitals are stable. -received IVF -Seen by neurology. Patient has had extensive workup at [facility] with negative test results including EEG -cardiology consultation with dr Doyne Keel see her as out pt -Patient is in sinus rhythm. Had  Episode of tachycardia --resolved  * Diarrhea ?stomach virus -feels better now tolerating po diet  *Anxiety/panic attacks -has not been on any anti-anxiety of meds. I recommended her to follow up with her therapist/psychologist and see if  she can be referred to a psychiatrist for trial of anti-anxiety medicine as outpatient. -Pt seems patient has anxiety spells from what she describes. Last night she woke up with feeling palpitation heart rate was 130. She felt shaky/jittery and then became diaphoretic. She took some Ativan made her feel better.  *Mild hypokalemia Will give 40 meq now and 20 meq qd x 4days  -Discussed with patient will follow-up with Dr. Lawerance Bach at [facility]. Was discussed with patient's mother also.  Per neurology recommendation she is not able to drive, operate heavy machinery, perform activities at height and participated water activities released by outpatient physician.  Pt and mother seem to understand voice understanding. Will discharge to home after seen by cardiology CONSULTS OBTAINED:  Treatment Team:  Kym Groom, MD Thana Farr, MD Antonieta Iba, MD  DRUG ALLERGIES:   Allergies  Allergen Reactions  . Penicillins Anaphylaxis and Swelling    Has patient had a PCN reaction causing immediate rash, facial/tongue/throat swelling, SOB or lightheadedness with hypotension: Yes Has patient had a PCN reaction causing severe rash involving mucus membranes or skin necrosis: No Has patient had a PCN reaction that required hospitalization No Has patient had a PCN reaction occurring within the last 10 years: Yes If all of the above answers are "NO", then [place] proceed with Cephalosporin use.  Marland Kitchen Amoxicillin     DISCHARGE MEDICATIONS:   Allergies as of 04/15/2018      Reactions   Penicillins Anaphylaxis, Swelling   Has patient had a PCN reaction causing immediate rash, facial/tongue/throat swelling, SOB or lightheadedness with hypotension: Yes Has patient had a PCN reaction causing severe rash involving  mucus membranes or skin necrosis: No Has patient had a PCN reaction that required hospitalization No Has patient had a PCN reaction occurring within the last 10 years: Yes If all  of the above answers are "NO", then [place] proceed with Cephalosporin use.   Amoxicillin       Medication List    STOP taking these medications   PRENATAL 28-0.8 MG Tabs     TAKE these medications   acetaminophen 325 MG tablet Commonly known as:  TYLENOL Take 650 mg by mouth every 6 (six) hours as needed for mild pain, fever or headache.   LORazepam 0.5 MG tablet Commonly known as:  ATIVAN Take 0.5 tablets (0.25 mg total) by mouth every 8 (eight) hours as needed for anxiety or sleep.       If you experience worsening of your admission symptoms, develop shortness of breath, life threatening emergency, suicidal or homicidal thoughts you must seek medical attention immediately by calling 911 or calling your MD immediately  if symptoms less severe.  You Must read complete instructions/literature along with all the possible adverse reactions/side effects for all the Medicines you take and that have been prescribed to you. Take any new Medicines after you have completely understood and accept all the possible adverse reactions/side effects.   Please note  You were cared for by a hospitalist during your hospital stay. If you have any questions about your discharge medications or the care you received while you were in the hospital after you are discharged, you can call the unit and asked to speak with the hospitalist on call if the hospitalist that took care of you is not available. Once you are discharged, your primary care physician will handle any further medical issues. Please note that NO REFILLS for any discharge medications will be authorized once you are discharged, as it is imperative that you return to your primary care physician (or establish a relationship with a primary care physician if you do not have one) for your aftercare needs so that they can reassess your need for medications and monitor your lab values. Today   SUBJECTIVE   She is overall doing well right now however did  have anxiety spell yesterday with palpitation relieved with Ativan  VITAL SIGNS:  Blood pressure 110/82, pulse 75, temperature 98.2 F (36.8 C), temperature source Oral, resp. rate 18, height 5\' 1"  (1.549 m), weight 58.9 kg, last menstrual period 03/23/2018, SpO2 100 %.  I/O:    Intake/Output Summary (Last 24 hours) at 04/15/2018 1434 Last data filed at 04/14/2018 2351 Gross per 24 hour  Intake 913.41 ml  Output 300 ml  Net 613.41 ml    PHYSICAL EXAMINATION:  GENERAL:  22 y.o.-year-old patient lying in the bed with no acute distress.  EYES: Pupils equal, round, reactive to light and accommodation. No scleral icterus. Extraocular muscles intact.  HEENT: Head atraumatic, normocephalic. Oropharynx and nasopharynx clear.  NECK:  Supple, no jugular venous distention. No thyroid enlargement, no tenderness.  LUNGS: Normal breath sounds bilaterally, no wheezing, rales,rhonchi or crepitation. No use of accessory muscles of respiration.  CARDIOVASCULAR: S1, S2 normal. No murmurs, rubs, or gallops.  ABDOMEN: Soft, non-tender, non-distended. Bowel sounds present. No organomegaly or mass.  EXTREMITIES: No pedal edema, cyanosis, or clubbing.  NEUROLOGIC: Cranial nerves II through XII are intact. Muscle strength 5/5 in all extremities. Sensation intact. Gait not checked.  PSYCHIATRIC: The patient is alert and oriented x 3.  SKIN: No obvious rash, lesion, or ulcer.  DATA REVIEW:   CBC  Recent Labs  Lab 04/14/18 0217  WBC 11.5*  HGB 13.4  HCT 41.7  PLT 219    Chemistries  Recent Labs  Lab 04/14/18 0217  NA 140  K 3.3*  CL 108  CO2 25  GLUCOSE 115*  BUN 12  CREATININE 0.71  CALCIUM 8.9  AST 20  ALT 12  ALKPHOS 54  BILITOT 0.5    Microbiology Results   No results found for this or any previous visit (from the past 240 hour(s)).  RADIOLOGY:  No results found.   Management plans discussed with the patient, family and they are in agreement.  CODE STATUS:     Code  Status Orders  (From admission, onward)         Start     Ordered   04/14/18 1602  Full code  Continuous     04/14/18 1601        Code Status History    This patient has a current code status but no historical code status.      TOTAL TIME TAKING CARE OF THIS PATIENT: 40 minutes.    Enedina Finner M.D on 04/15/2018 at 2:34 PM  Between 7am to 6pm - Pager - [phone] After 6pm go to [email or web address] - Social research officer, government  [facility]  Office  [phone]  CC: Primary care physician; [facility]

## 2018-04-15 NOTE — Consult Note (Addendum)
Cardiology Consultation:   Patient ID: [name] MRN: [id number]; DOB: November 09, 1995  Admit date: 04/14/2018 Date of Consult: 04/15/2018  Primary Care Provider: [facility], [place] Primary Cardiologist: [facility], Dr. Rockey Situ Primary Electrophysiologist:  None    Patient Profile:   [name] is a 22 y.o. female with a hx of syncope, SOB, palpitations, anxiety, postpartum depression, chronic nausea and diarrhea, and s/p cholecystectomy who is being seen today for the evaluation of syncope at the request of Dr. Marcille Blanco.  History of Present Illness:   [name] is a 22 yo female with PMH as above.  Pertinent history of syncopal episodes since 2015, anxiety with panic attack disorder, chronic persistent daily headaches, migraine headaches.   2015 and echo copied and pasted below with normal EF, no LVH.   12/2014: She was reportedly seen by Dr. Clayborn Bigness for L sided chest pain, tachycardia, palpitations, and SOB with negative workup. Per documentation, he recommended naprosyn therapy, hydroxyzine, diclegis, hydration, and follow-up in 6 months.  04/2017-2019: She was recently evaluated in the ED on 04/11/2017 with similar syncopal episode and symptoms. She reported presyncope, posterior neck pain, and a syncopal episode that started while she was asleep at night. She was found to be hypoglycemic therefore treated and discharged.  She presented again on 04/13/2018 to [facility] ED with a similar episode but this time had a witnessed episode by ED staff.  Patient stated she then left AMA & prior to discharge due to prolonged wait time.  She came back to [name] ED due to worsening symptoms.  Patient stated that she had a long-standing history of syncope associated with nausea and vomiting, feeling cold or clammy, or blurry vision, palpitations, and SOB. She reported that this syncope often occurred after nausea and vomiting and/or a loose bowel movement. She reported a frequent feeling of  bowel urgency before each episode of syncope. She reported increased syncopal episodes from 1x to 2x per year  3x a week.  Of note, she reports worsening sx of nausea, diarrhea, and syncope since cholecystectomy in 2016. Per Epic and CareEverywhere, she has had extensive work-up for syncope and has been evaluated by [facility] neurology, cardiology, psychiatry, and endocrinology no identifiable causes of the symptoms.  Wilburn Mylar, she presented to the [facility] ED on 04/14/2017 with complaints of nausea and abdominal pain with syncope. She also reported palpitations.  In the ED, she was found to be hypokalemic with BP at time hypotensive and tachycardic. She also had elevated WBC with leukocytes present in her UA. Vitals: BP 111/70, HR 75, T 98.2, 100% SpO2 Labs: NA 140, K 3.3, glucose 115, CR 0.71, BUN 12, CA 8.9, albumin 4.1, AST 20, ALT 12, alk phosphatase 54, lipase 56, WBC of 11.5 lipase 56, WBC 11.5, hemoglobin 13.4, hematocrit 41.7, platelets 219, BNP 8, Troponin negative  Hgb A1C 4.4 TSH 0.955 EKG: Sinus tachycardia with HR 115 and RAD with ectopy CXR: None  She was admitted to [facility] for further evaluation and management with Cardiology consulted.  Past Medical History:  Diagnosis Date  . Anemia   . Anxiety   . Bladder infection, acute 01-10-2015   completed antibiotics and denies any s&s of infection (01-30-15)  . Dysrhythmia    occ irregular heart beat per pt  . GERD (gastroesophageal reflux disease)    NO MEDS  . Headache    MIGRAINES DAILY    Past Surgical History:  Procedure Laterality Date  . ADENOIDECTOMY    . CHOLECYSTECTOMY N/A  02/01/2015   Procedure: LAPAROSCOPIC CHOLECYSTECTOMY WITH INTRAOPERATIVE CHOLANGIOGRAM;  Surgeon: Robert Bellow, MD;  Location: [facility] ORS;  Service: General;  Laterality: N/A;  . TONSILLECTOMY    . WISDOM TOOTH EXTRACTION       Home Medications:  Prior to Admission medications   Medication Sig Start Date End Date Taking? Authorizing Provider    acetaminophen (TYLENOL) 325 MG tablet Take 650 mg by mouth every 6 (six) hours as needed for mild pain, fever or headache.   Yes [provider]  LORazepam (ATIVAN) 0.5 MG tablet Take 0.5 tablets (0.25 mg total) by mouth every 8 (eight) hours as needed for anxiety or sleep. 04/15/18   Fritzi Mandes, MD  PRENATAL 28-0.8 MG TABS 1 tablet daily. Patient not taking: Reported on 04/14/2018 01/18/18   Coral Spikes, DO    Inpatient Medications: Scheduled Meds: . docusate sodium  100 mg Oral BID  . enoxaparin (LOVENOX) injection  40 mg Subcutaneous Q24H  .  morphine injection  4 mg Intravenous Once  . ondansetron (ZOFRAN) IV  4 mg Intravenous Once   Continuous Infusions: . sodium chloride 125 mL/hr at 04/15/18 0805   PRN Meds: acetaminophen **OR** acetaminophen, LORazepam, ondansetron **OR** ondansetron (ZOFRAN) IV  Allergies:    Allergies  Allergen Reactions  . Penicillins Anaphylaxis and Swelling    Has patient had a PCN reaction causing immediate rash, facial/tongue/throat swelling, SOB or lightheadedness with hypotension: Yes Has patient had a PCN reaction causing severe rash involving mucus membranes or skin necrosis: No Has patient had a PCN reaction that required hospitalization No Has patient had a PCN reaction occurring within the last 10 years: Yes If all of the above answers are "NO", then [place] proceed with Cephalosporin use.  Marland Kitchen Amoxicillin     Social History:   Social History   Socioeconomic History  . Marital status: Married    Spouse name: Not on file  . Number of children: Not on file  . Years of education: Not on file  . Highest education level: Not on file  Occupational History  . Not on file  Social Needs  . Financial resource strain: Not on file  . Food insecurity:    Worry: Not on file    Inability: Not on file  . Transportation needs:    Medical: Not on file    Non-medical: Not on file  Tobacco Use  . Smoking status: Former Smoker    Packs/day:  0.50    Years: 8.00    Pack years: 4.00    Types: Cigarettes  . Smokeless tobacco: Never Used  Substance and Sexual Activity  . Alcohol use: Yes    Alcohol/week: 0.0 standard drinks    Comment: seldom  . Drug use: No  . Sexual activity: Yes    Birth control/protection: None  Lifestyle  . Physical activity:    Days per week: Not on file    Minutes per session: Not on file  . Stress: Not on file  Relationships  . Social connections:    Talks on phone: Not on file    Gets together: Not on file    Attends religious service: Not on file    Active member of club or organization: Not on file    Attends meetings of clubs or organizations: Not on file    Relationship status: Not on file  . Intimate partner violence:    Fear of current or ex partner: Not on file    Emotionally abused: Not  on file    Physically abused: Not on file    Forced sexual activity: Not on file  Other Topics Concern  . Not on file  Social History Narrative  . Not on file    Family History:    Family History  Problem Relation Age of Onset  . Healthy Mother   . Hypertension Father   . Hyperlipidemia Father   . Cancer Maternal Aunt        breast  . CAD Paternal Grandmother        d. 40  . CAD Paternal Grandfather        d. 45     ROS:  Please see the history of present illness.   All other ROS reviewed and negative.     Physical Exam/Data:   Vitals:   04/14/18 1500 04/14/18 2051 04/15/18 0432 04/15/18 0817  BP: 110/72 131/86 (!) 92/55 110/82  Pulse: 84 (!) 117 65 75  Resp: 19 20 18    Temp:  98.7 F (37.1 C) 98.3 F (36.8 C) 98.2 F (36.8 C)  TempSrc:  Oral Oral Oral  SpO2: 99% 100% 98% 100%  Weight:   58.9 kg   Height:        Intake/Output Summary (Last 24 hours) at 04/15/2018 1423 Last data filed at 04/14/2018 2351 Gross per 24 hour  Intake 913.41 ml  Output 300 ml  Net 613.41 ml   Filed Weights   04/13/18 2136 04/15/18 0432  Weight: 61.2 kg 58.9 kg   Body mass index is  24.54 kg/m.  Physical exam documented by MD attestation above.   EKG:  The EKG was personally reviewed and demonstrates:  As above Telemetry:  Telemetry was personally reviewed and demonstrates:  SR, sinus tachycardia  Relevant CV Studies: 12/2013 TTE INTERPRETATION NORMAL LEFT VENTRICULAR SYSTOLIC FUNCTION WITH NO LVH NORMAL RIGHT VENTRICULAR SYSTOLIC FUNCTION MILD VALVULAR REGURGITATION (See above) NO VALVULAR STENOSIS Normal left ventricle function ejection fraction of between 50 and 55% normal echo  Laboratory Data:  Chemistry Recent Labs  Lab 04/11/18 0407 04/13/18 1428 04/14/18 0217  NA 141 139 140  K 3.3* 3.4* 3.3*  CL 109 107 108  CO2 26 24 25   GLUCOSE 107* 103* 115*  BUN 13 12 12   CREATININE 0.73 0.74 0.71  CALCIUM 9.0 9.1 8.9  GFRNONAA >60 >60 >60  GFRAA >60 >60 >60  ANIONGAP 6 8 7     Recent Labs  Lab 04/14/18 0217  PROT 6.9  ALBUMIN 4.1  AST 20  ALT 12  ALKPHOS 54  BILITOT 0.5   Hematology Recent Labs  Lab 04/11/18 0407 04/13/18 1428 04/14/18 0217  WBC 10.5 10.0 11.5*  RBC 4.57 4.89 4.73  HGB 12.8 13.5 13.4  HCT 40.8 44.5 41.7  MCV 89.3 91.0 88.2  MCH 28.0 27.6 28.3  MCHC 31.4 30.3 32.1  RDW 12.7 12.7 12.6  PLT 212 232 219   Cardiac Enzymes Recent Labs  Lab 04/14/18 0217  TROPONINI <0.03   No results for input(s): TROPIPOC in the last 168 hours.  BNP Recent Labs  Lab 04/14/18 0217  BNP 8.0    DDimer No results for input(s): DDIMER in the last 168 hours.  Radiology/Studies:  No results found.  Assessment and Plan:   1. Syncope, likely vasovagal and orthostatic 2/2 chronic diarrhea and emesis - Patient reported diarrhea and GI distress. Syncope likely multifactorial with suspicion for orthostatic and vasovagal with diarrhea and infection, dehydration / electrolyte imbalance playing a role as well as  neurogenic with anxiety and high sympathetic tone and cardiogenic with hypotension and tachycardia are all playing a role in  etiology. Orthostatics positive and in chart. Vitals significant for tachycardia, hypotension. Progressive episodes of syncope this week and in the setting of GI infection. Of note, patient reported GI trouble since removal of gallbladder. Consider outpatient GI visit for treatment of GI problems. No signs of arrhythmia per telemetry and  EKG. 2015 EF normal and no valvular disease per 2015 TTE. No troponin elevation this admission. Hgb normal.  - Management: Ordered patient compression stockings. Replete electrolytes with 64mq before discharge and remainder of KCl repletion at home with goal 4.0. Checking magnesium with goal 2.0. Follow-up BMET in office in 1 week. Oral hydration, diet, and lifestyle recommendations/ counseling provided regarding salt and fluid intake. Instruction provided to patient that she should not drive with symptoms.  2. Hypokalemia -Potassium 40 mEq x1 now & plan to discharge with potassium repletion regimen -plan for follow-up BMET in 1 week in our office. Ordered Mg check prior to discharge with goal 2.0  3. GI distress with diarrhea - Consider outpatient GI appointment given worsened s/p cholecystectomy   3. Anxiety - Patient has therapist and will follow-up  For questions or updates, please contact CThe SilosPlease consult [email or web address] for contact info under     Signed, JArvil Chaco PA-C  04/15/2018 2:23 PM

## 2018-04-15 NOTE — Care Management Note (Signed)
Case Management Note  Patient Details  Name: Tyrianna Lightle Spooner MRN: [id number] Date of Birth: 1995/06/13  Subjective/Objective:      Patient is from home with husband and son. She stays at home with son. Her husband is employed.  She presented to the ER with nausea, syncope and tachycardia.  Neuro and cardiology consults pending.  Patients states she will have an EEG today.  She c/o anxiety at this time as she wants to be home with her son and she is unsure of what's happening to her.  She is current with [facility].  Not on any prescription medications.  Will follow as patient progresses and help with medication needs as she does not have insurance.             Action/Plan:   Expected Discharge Date:                  Expected Discharge Plan:  Home/Self Care  In-House Referral:     Discharge planning Services  CM Consult  Post Acute Care Choice:    Choice offered to:     DME Arranged:    DME Agency:     HH Arranged:    HH Agency:     Status of Service:  In process, will continue to follow  If discussed at Long Length of Stay Meetings, dates discussed:    Additional Comments:  Sherren Kerns, RN 04/15/2018, 10:54 AM

## 2018-04-15 NOTE — Progress Notes (Signed)
Went over discharge instructions with the patient and family including medications and follow-up appointment. Discontinue peripheral IV and telemetry monitor. NT to transport patient for discharge.

## 2018-04-16 ENCOUNTER — Telehealth: Payer: Self-pay | Admitting: Cardiovascular Disease

## 2018-04-16 NOTE — Telephone Encounter (Addendum)
Patient scheduled for labs prior to her appointment.

## 2018-04-16 NOTE — Telephone Encounter (Signed)
-----   Message from Lennon Alstrom, PA-C sent at 04/15/2018  6:53 PM EST ----- Regarding: Follow-up Good evening,  [name] was discharged today from the hospital. She was seen for syncope with electrolyte imbalance and discharged home with potassium.   Could you please schedule her for a follow-up appointment with Dr. Mariah Milling and a follow-up BMET to ensure she reached K goal 4.0 with her home potassium?   She is not TCM - whenever you can next get her in!   Thank you,  Signed, Lennon Alstrom, PA-C 04/15/2018, 6:55 PM Pager [phone]

## 2018-04-16 NOTE — Telephone Encounter (Signed)
Patient scheduled 11/26 [name]   Please order bmet and advise timeframe for scheduling

## 2018-04-19 ENCOUNTER — Other Ambulatory Visit: Payer: Self-pay | Admitting: Physician Assistant

## 2018-04-19 DIAGNOSIS — R55 Syncope and collapse: Secondary | ICD-10-CM

## 2018-04-28 ENCOUNTER — Other Ambulatory Visit (INDEPENDENT_AMBULATORY_CARE_PROVIDER_SITE_OTHER): Payer: Self-pay

## 2018-04-28 DIAGNOSIS — R55 Syncope and collapse: Secondary | ICD-10-CM

## 2018-04-30 LAB — BASIC METABOLIC PANEL
BUN / CREAT RATIO: 12 (ref 9–23)
BUN: 11 mg/dL (ref 6–20)
CALCIUM: 9.4 mg/dL (ref 8.7–10.2)
CO2: 22 mmol/L (ref 20–29)
CREATININE: 0.89 mg/dL (ref 0.57–1.00)
Chloride: 102 mmol/L (ref 96–106)
GFR calc Af Amer: 106 mL/min/{1.73_m2} (ref >59)
GFR calc non Af Amer: 92 mL/min/{1.73_m2} (ref >59)
GLUCOSE: 74 mg/dL (ref 65–99)
Potassium: 4.1 mmol/L (ref 3.5–5.2)
Sodium: 139 mmol/L (ref 134–144)

## 2018-05-03 NOTE — Progress Notes (Signed)
Cardiology Office Note Date:  05/04/2018  Patient ID:  [name], DOB [birth year], MRN [id number] PCP:  [facility]  Cardiologist:  Dr. Mariah Milling, MD    Chief Complaint: Hospital follow up  History of Present Illness: [name] is a 22 y.o. female with history of atypical chest pain, tachypalpitations, syncope, orthostasis, anxiety with panic attack disorder, migraine disorder, and postpartum depression who presents for hospital follow-up after recent admission to [facility] from 11/6 to 11/7 for syncope suspected to be in the setting of dehydration and orthostasis.  Patient was previously followed by Dr. Juliann Pares for palpitations and atypical chest pain.  Prior echocardiogram in 2015 showed an EF of 50 to 55%, normal RV systolic function, no valvular stenosis.  It appears that she underwent a treadmill stress test in 2015 as well though the results are not available for review.    She reportedly has a history of syncope dating back to 2015 with reported extensive work-up through [facility] being unrevealing per notes, including neurology, cardiology, endocrinology, and psychiatry.  I do not have these results for review at this time.  Prior MRI of the brain from 2018 normal through [facility].  Patient was seen in the ED and 04/2017 with presyncope felt to be in the setting of hypoglycemia.  She was seen again in the ED in 04/2018 with recurrent syncope, though left AMA and presented to [facility] ED for evaluation. She reported her syncope typically occurred following vomiting or loose bowel movements. She reported a history of worsening syncope following her cholecystectomy in 2016 with an increase in vomiting and diarrhea. She was noted to be hypotensive and orthostatic requiring IV fluids with improvement in symptoms. She was noted to also have paroxysmal tachycardia of unclear etiology.   Labs during admission: Mg++ 1.8, A1c 4.4, TSH normal, UA with large LE, Troponin negative, BNP 8,  HCG < 1, WBC 11.5, HGB 13.4, lipase elevated at 56, K+ 3.3, Na 140, glucose 115, SCr 0.71, LFT normal. EKG with sinus tachycardia and a rare PVC.   Follow up bmet on 11/20 showed a SCr of 0.89, K+ 4.1.  She comes in accompanied by her husband today. She reports continued intermittent palpitations, which have been an issue for her since ~ 2014. She has not had any further syncopal episodes. She did have an episode of dizziness with associated palpitations on 11/25, though this was in the setting of decreased po intake, including food and water. She typically drinks several sodas daily and not much water, though since her discharge, she has tried to cut back on the sodas and increase her water intake. She has been having palpitations several times per day that typically last 5-30 minutes and self resolve. She notes, with her underlying anxiety her symptoms typically are exacerbated. She also notes an intermittent chest pressure that is centralized and does not radiate. This chest pressure is not associated with exertion and last for ~ 5 minutes and resolves. She has also noted one episode of sharp lower, left-sided chest pain on 11/25 that was associated with sitting. She is currently chest pain free. She denies any illegal drugs (previously smoked marijuana). She no longer smokes and does not drink alcohol. Her father has an "arrhythmia." She does report her paternal grandmother died suddenly at age 74 from a "heart attack." Her paternal grandfather died at age 49 from a "heart attack, but he was on heart pills."   Past Medical History:  Diagnosis Date  .  Anemia   . Anxiety   . Bladder infection, acute 01-10-2015   completed antibiotics and denies any s&s of infection (01-30-15)  . Dysrhythmia    occ irregular heart beat per pt  . GERD (gastroesophageal reflux disease)    NO MEDS  . Headache    MIGRAINES DAILY    Past Surgical History:  Procedure Laterality Date  . ADENOIDECTOMY    .  CHOLECYSTECTOMY N/A 02/01/2015   Procedure: LAPAROSCOPIC CHOLECYSTECTOMY WITH INTRAOPERATIVE CHOLANGIOGRAM;  Surgeon: Earline MayotteJeffrey W Byrnett, MD;  Location: [facility] ORS;  Service: General;  Laterality: N/A;  . TONSILLECTOMY    . WISDOM TOOTH EXTRACTION      Current Meds  Medication Sig  . acetaminophen (TYLENOL) 325 MG tablet Take 650 mg by mouth every 6 (six) hours as needed for mild pain, fever or headache.  Marland Kitchen. LORazepam (ATIVAN) 0.5 MG tablet Take 0.5 tablets (0.25 mg total) by mouth every 8 (eight) hours as needed for anxiety or sleep.    Allergies:   Penicillins and Amoxicillin   Social History:  The patient  reports that she has quit smoking. Her smoking use included cigarettes. She has a 4.00 pack-year smoking history. She has never used smokeless tobacco. She reports that she drank alcohol. She reports that she does not use drugs.   Family History:  The patient's family history includes Arrhythmia in her father; CAD in her paternal grandfather and paternal grandmother; Cancer in her maternal aunt; Healthy in her mother; Hyperlipidemia in her father; Hypertension in her father.  ROS:   Review of Systems  Constitutional: Positive for malaise/fatigue. Negative for chills, diaphoresis, fever and weight loss.  HENT: Negative for congestion.   Eyes: Negative for discharge and redness.  Respiratory: Negative for cough, hemoptysis, sputum production, shortness of breath and wheezing.   Cardiovascular: Positive for chest pain and palpitations. Negative for orthopnea, claudication, leg swelling and PND.  Gastrointestinal: Positive for diarrhea, nausea and vomiting. Negative for abdominal pain, blood in stool, constipation, heartburn and melena.  Genitourinary: Negative for hematuria.  Musculoskeletal: Negative for falls and myalgias.  Skin: Negative for rash.  Neurological: Positive for dizziness and weakness. Negative for tingling, tremors, sensory change, speech change, focal weakness and loss of  consciousness.  Endo/Heme/Allergies: Does not bruise/bleed easily.  Psychiatric/Behavioral: Negative for substance abuse. The patient is nervous/anxious.   All other systems reviewed and are negative.    PHYSICAL EXAM:  VS:  BP 111/72 (BP Location: Right Arm, Patient Position: Sitting, Cuff Size: Normal)   Pulse 70   Ht 5\' 1"  (1.549 m)   Wt 127 lb 12 oz (57.9 kg)   BMI 24.14 kg/m  BMI: Body mass index is 24.14 kg/m.  Physical Exam  Constitutional: She is oriented to person, place, and time. She appears well-developed and well-nourished.  HENT:  Head: Normocephalic and atraumatic.  Eyes: Right eye exhibits no discharge. Left eye exhibits no discharge.  Neck: Normal range of motion. No JVD present.  Cardiovascular: Normal rate, regular rhythm, S1 normal, S2 normal and normal heart sounds. Exam reveals no distant heart sounds, no friction rub, no midsystolic click and no opening snap.  No murmur heard. Pulses:      Posterior tibial pulses are 2+ on the right side, and 2+ on the left side.  Pulmonary/Chest: Effort normal and breath sounds normal. No respiratory distress. She has no decreased breath sounds. She has no wheezes. She has no rales. She exhibits no tenderness.  Abdominal: Soft. She exhibits no distension. There is  no tenderness.  Musculoskeletal: She exhibits no edema.  Neurological: She is alert and oriented to person, place, and time.  Skin: Skin is warm and dry. No cyanosis. Nails show no clubbing.  Psychiatric: She has a normal mood and affect. Her speech is normal and behavior is normal. Judgment and thought content normal.     EKG:  Was ordered and interpreted by me today. Shows NSR with sinus arrhythmia, 70 bpm, normal axis, no acute st/t changes  Recent Labs: 04/14/2018: ALT 12; B Natriuretic Peptide 8.0; Hemoglobin 13.4; Platelets 219; TSH 0.955 04/15/2018: Magnesium 1.8 04/28/2018: BUN 11; Creatinine, Ser 0.89; Potassium 4.1; Sodium 139  No results found for  requested labs within last 8760 hours.   Estimated Creatinine Clearance: 80.4 mL/min (by C-G formula based on SCr of 0.89 mg/dL).   Wt Readings from Last 3 Encounters:  05/04/18 127 lb 12 oz (57.9 kg)  04/15/18 129 lb 14.4 oz (58.9 kg)  04/11/18 135 lb (61.2 kg)     Other studies reviewed: Additional studies/records reviewed today include: summarized above  ASSESSMENT AND PLAN:  1. Dizziness/presyncope/syncope/palpitations: Of uncertain etiology. Orthostatic vitals are negative in the office today. Symptoms seem to have been associated either with possible hypoglycemic episodes as noted in prior ED notes, vs orthostasis from intermittent nausea, vomiting, and diarrhea, vs poor po intake with dehydration. Cannot exclude potential cardiac etiology at this time. Schedule real time monitoring Zio monitor given her syncope. Check echo. Consider EP evaluation for possible POTS.   2. Chest pain: Symptoms fairly atypical, though with her reported family history of sudden cardaic death, would like to evaluate for potential structural abnormalities. She was a very active kid growing up and was without any symptoms of dizziness, presyncope, syncope, or chest pain. Currently asymptomatic. Schedule stress echo.  3. Hypokalemia: Repleted.   4. Anxiety: Likely playing some role in the above. Patient admits to this as well.   5. GI upset: Per PCP.    Disposition: F/u with Dr. Mariah Milling or an APP in 4 weeks.   Current medicines are reviewed at length with the patient today.  The patient did not have any concerns regarding medicines.  Signed, Eula Listen, PA-C 05/04/2018 4:17 PM     [facility] [address] [place], [place] [address] [phone]

## 2018-05-04 ENCOUNTER — Ambulatory Visit (INDEPENDENT_AMBULATORY_CARE_PROVIDER_SITE_OTHER): Payer: Self-pay | Admitting: Physician Assistant

## 2018-05-04 ENCOUNTER — Encounter: Payer: Self-pay | Admitting: Physician Assistant

## 2018-05-04 VITALS — BP 111/72 | HR 70 | Ht 61.0 in | Wt 127.8 lb

## 2018-05-04 DIAGNOSIS — Z8241 Family history of sudden cardiac death: Secondary | ICD-10-CM

## 2018-05-04 DIAGNOSIS — R42 Dizziness and giddiness: Secondary | ICD-10-CM

## 2018-05-04 DIAGNOSIS — F419 Anxiety disorder, unspecified: Secondary | ICD-10-CM

## 2018-05-04 DIAGNOSIS — R002 Palpitations: Secondary | ICD-10-CM

## 2018-05-04 DIAGNOSIS — E876 Hypokalemia: Secondary | ICD-10-CM

## 2018-05-04 DIAGNOSIS — R55 Syncope and collapse: Secondary | ICD-10-CM

## 2018-05-04 DIAGNOSIS — R079 Chest pain, unspecified: Secondary | ICD-10-CM

## 2018-05-04 LAB — POC URINE PREG, ED

## 2018-05-04 NOTE — Patient Instructions (Signed)
Medication Instructions:  - Your physician recommends that you continue on your current medications as directed. Please refer to the Current Medication list given to you today.  If you need a refill on your cardiac medications before your next appointment, please call your pharmacy.   Lab work: - none ordered  If you have labs (blood work) drawn today and your tests are completely normal, you will receive your results only by: Marland Kitchen. MyChart Message (if you have MyChart) OR . A paper copy in the mail If you have any lab test that is abnormal or we need to change your treatment, we will call you to review the results.  Testing/Procedures: - Your physician has recommended to you wear a 14 day heart monitor (ZIO AT patch). - This should be mailed directly to your home within the next 2-4 days - The company ([employer]) should call you just prior to receiving the monitor/ right after you receive it, so if you see an [phone]#/ [phone] show up on your caller ID, please answer this call   - Your physician has requested that you have a stress echocardiogram. For further information please visit [email or web address].  - you Monier eat a light breakfast/ lunch prior to your procedure - no caffeine for 24 hours prior to your test (coffee, tea, soft drinks, or chocolate)  - no smoking/ vaping for 4 hours prior to your test - you Calzada take your regular medications the day of your test - bring any inhalers with you to your test - wear comfortable clothing & tennis/ non-skid shoes to walk on the treadmill  Follow-Up: At [facility], you and your health needs are our priority.  As part of our continuing mission to provide you with exceptional heart care, we have created designated Provider Care Teams.  These Care Teams include your primary Cardiologist (physician) and Advanced Practice Providers (APPs -  Physician Assistants and Nurse Practitioners) who all work together to provide you with the care you need, when you  need it. . 4 weeks with Dr. Mariah MillingGollan  Any Other Special Instructions Will Be Listed Below (If Applicable). - N/A

## 2018-05-07 ENCOUNTER — Ambulatory Visit (INDEPENDENT_AMBULATORY_CARE_PROVIDER_SITE_OTHER): Payer: Self-pay

## 2018-05-07 DIAGNOSIS — R55 Syncope and collapse: Secondary | ICD-10-CM

## 2018-05-25 ENCOUNTER — Telehealth: Payer: Self-pay | Admitting: *Deleted

## 2018-05-25 NOTE — Telephone Encounter (Signed)
error 

## 2018-06-03 ENCOUNTER — Telehealth: Payer: Self-pay | Admitting: Cardiovascular Disease

## 2018-06-03 NOTE — Telephone Encounter (Signed)
Please call regarding stress echo instructions for tomorrow.

## 2018-06-03 NOTE — Telephone Encounter (Signed)
Spoke with patient and reviewed stress echo instructions for her test tomorrow. Requested that she come in a little early to register. She was appreciative for the call back with no further questions at this time.

## 2018-06-04 ENCOUNTER — Other Ambulatory Visit: Payer: Self-pay

## 2018-06-04 ENCOUNTER — Ambulatory Visit (INDEPENDENT_AMBULATORY_CARE_PROVIDER_SITE_OTHER): Payer: Self-pay

## 2018-06-04 DIAGNOSIS — R079 Chest pain, unspecified: Secondary | ICD-10-CM

## 2018-06-04 DIAGNOSIS — Z8241 Family history of sudden cardiac death: Secondary | ICD-10-CM

## 2018-06-07 DIAGNOSIS — F419 Anxiety disorder, unspecified: Secondary | ICD-10-CM | POA: Insufficient documentation

## 2018-06-07 DIAGNOSIS — R0789 Other chest pain: Secondary | ICD-10-CM | POA: Insufficient documentation

## 2018-06-07 DIAGNOSIS — R002 Palpitations: Secondary | ICD-10-CM | POA: Insufficient documentation

## 2018-06-07 LAB — ECHOCARDIOGRAM STRESS TEST
CSEPED: 7 min
CSEPHR: 91 %
Estimated workload: 9 METS
Exercise duration (sec): 21 s
MPHR: 196 {beats}/min
Peak HR: 179 {beats}/min
Rest HR: 112 {beats}/min

## 2018-06-07 NOTE — Progress Notes (Signed)
Cardiology Office Note  Date:  06/08/2018   ID:  Mackenzie PihRoxanne M Beilfuss, DOB Hadden 03, 1997, MRN [id number]  [facility]  [facility]   Chief Complaint  Patient presents with  . other    [facility]. Meds reviewed by the pt. verbally. Pt. c/o chest pain for one week with rapid heart beats.      HPI:  [name] is a 22 y.o. female with history of  atypical chest pain,  tachypalpitations,  syncope,  orthostasis,  anxiety with panic attack disorder, migraine disorder, postpartum depression  hospital 11/6 to 11/7 for syncope ,dehydration and orthostasis. Recent stress echo showing normal study   followed by Dr. Juliann Paresallwood for palpitations and atypical chest pain.   Prior echocardiogram in 2015 showed an EF of 50 to 55%, normal RV systolic function, no valvular stenosis.   treadmill stress test in 2015    history of syncope dating back to 2015  [facility], cardiology, endocrinology, and psychiatry.  [facility]    ED  04/2017 with presyncope felt to be in the setting of hypoglycemia.  ED  04/2018 with recurrent syncope, though left AMA  [facility].  She reported her syncope typically occurred following vomiting or loose bowel movements. She reported a history of worsening syncope following her cholecystectomy in 2016 with an increase in vomiting and diarrhea.  Event monitor, details pulled up in the office and discussed with her Normal sinus rhythm Avg HR of 78 bpm.  Isolated SVEs were rare (<1.0%), and no SVE Couplets or SVE Triplets were present. Isolated VEs were rare (<1.0%), VE Couplets were rare (<1.0%), and no VE Triplets were present. Patient triggered events were not associated with significant arrhythmia  Has rare palpitations Occasional lightheadedness when she stands up Blood pressure runs chronically low Sometimes dehydrated  EKG  personally reviewed by myself on todays visit Shows normal sinus rhythm rate 59 bpm no significant ST or T wave changes   PMH:   has a past medical history of Anemia, Anxiety, Bladder infection, acute (01-10-2015), Dysrhythmia, GERD (gastroesophageal reflux disease), and Headache.  PSH:    Past Surgical History:  Procedure Laterality Date  . ADENOIDECTOMY    . CHOLECYSTECTOMY N/A 02/01/2015   Procedure: LAPAROSCOPIC CHOLECYSTECTOMY WITH INTRAOPERATIVE CHOLANGIOGRAM;  Surgeon: Earline MayotteJeffrey W Byrnett, MD;  Location: [facility] ORS;  Service: General;  Laterality: N/A;  . TONSILLECTOMY    . WISDOM TOOTH EXTRACTION      Current Outpatient Medications  Medication Sig Dispense Refill  . acetaminophen (TYLENOL) 325 MG tablet Take 650 mg by mouth every 6 (six) hours as needed for mild pain, fever or headache.     No current facility-administered medications for this visit.     Allergies:   Amoxicillin; Penicillin g; and Penicillins   Social History:  The patient  reports that she has quit smoking. Her smoking use included cigarettes. She has a 4.00 pack-year smoking history. She has never used smokeless tobacco. She reports previous alcohol use. She reports that she does not use drugs.   Family History:   family history includes Arrhythmia in her father; CAD in her paternal grandfather and paternal grandmother; Cancer in her maternal aunt; Healthy in her mother; Hyperlipidemia in her father; Hypertension in her father.    Review of Systems: Review of Systems  Constitutional: Negative.   Respiratory: Negative.   Cardiovascular: Negative.  Gastrointestinal: Negative.   Musculoskeletal: Negative.   Neurological: Negative.   Psychiatric/Behavioral: Negative.   All other systems reviewed and are negative.    PHYSICAL EXAM: VS:  BP 100/60 (BP Location: Left Arm, Patient Position: Sitting, Cuff Size: Normal)   Pulse (!) 59   Ht 5\' 1"  (1.549 m)   Wt 128 lb (58.1 kg)   BMI 24.19 kg/m  , BMI Body  mass index is 24.19 kg/m. GEN: Well nourished, well developed, in no acute distress  HEENT: normal  Neck: no JVD, carotid bruits, or masses Cardiac: RRR; no murmurs, rubs, or gallops,no edema  Respiratory:  clear to auscultation bilaterally, normal work of breathing GI: soft, nontender, nondistended, + BS MS: no deformity or atrophy  Skin: warm and dry, no rash Neuro:  Strength and sensation are intact Psych: euthymic mood, full affect   Recent Labs: 04/14/2018: ALT 12; B Natriuretic Peptide 8.0; Hemoglobin 13.4; Platelets 219; TSH 0.955 04/15/2018: Magnesium 1.8 04/28/2018: BUN 11; Creatinine, Ser 0.89; Potassium 4.1; Sodium 139    Lipid Panel No results found for: CHOL, HDL, LDLCALC, TRIG    Wt Readings from Last 3 Encounters:  06/08/18 128 lb (58.1 kg)  05/04/18 127 lb 12 oz (57.9 kg)  04/15/18 129 lb 14.4 oz (58.9 kg)      ASSESSMENT AND PLAN:  Palpitations - Plan: EKG 12-Lead Rare PVCs No other significant arrhythmia noted on monitor  Blood pressure to start beta-blockers  Atypical chest pain Prior work-up, recent treadmill stress echo showing no wall motion abnormality normal ejection fraction No further work-up needed  Anxiety Previously felt well on Ativan She reports that primary care did not want to keep her on Ativan and prescribed hydroxyzine which she has not tried yet She would prefer not to take a medication on a daily basis such as an SSRI  Disposition:   F/U as needed   Total encounter time more than 25 minutes  Greater than 50% was spent in counseling and coordination of care with the patient    Orders Placed This Encounter  Procedures  . EKG 12-Lead     Signed, Dossie Arbourim Diany Formosa, M.D., Ph.D. 06/08/2018  [facility], [place] [phone]

## 2018-06-08 ENCOUNTER — Encounter: Payer: Self-pay | Admitting: Cardiovascular Disease

## 2018-06-08 ENCOUNTER — Ambulatory Visit (INDEPENDENT_AMBULATORY_CARE_PROVIDER_SITE_OTHER): Payer: Self-pay | Admitting: Cardiovascular Disease

## 2018-06-08 VITALS — BP 100/60 | HR 59 | Ht 61.0 in | Wt 128.0 lb

## 2018-06-08 DIAGNOSIS — R0789 Other chest pain: Secondary | ICD-10-CM

## 2018-06-08 DIAGNOSIS — R55 Syncope and collapse: Secondary | ICD-10-CM

## 2018-06-08 DIAGNOSIS — R002 Palpitations: Secondary | ICD-10-CM

## 2018-06-08 DIAGNOSIS — F419 Anxiety disorder, unspecified: Secondary | ICD-10-CM

## 2018-06-08 NOTE — Patient Instructions (Signed)

## 2018-07-03 ENCOUNTER — Emergency Department
Admission: EM | Admit: 2018-07-03 | Discharge: 2018-07-03 | Disposition: A | Discharge status: Home / Self Care | Payer: Self-pay | Attending: Emergency Medicine | Admitting: Emergency Medicine

## 2018-07-03 ENCOUNTER — Other Ambulatory Visit: Payer: Self-pay

## 2018-07-03 DIAGNOSIS — I951 Orthostatic hypotension: Secondary | ICD-10-CM | POA: Insufficient documentation

## 2018-07-03 DIAGNOSIS — R42 Dizziness and giddiness: Secondary | ICD-10-CM

## 2018-07-03 DIAGNOSIS — R1084 Generalized abdominal pain: Secondary | ICD-10-CM | POA: Insufficient documentation

## 2018-07-03 DIAGNOSIS — A084 Viral intestinal infection, unspecified: Secondary | ICD-10-CM | POA: Insufficient documentation

## 2018-07-03 DIAGNOSIS — Z87891 Personal history of nicotine dependence: Secondary | ICD-10-CM | POA: Insufficient documentation

## 2018-07-03 DIAGNOSIS — E860 Dehydration: Secondary | ICD-10-CM | POA: Insufficient documentation

## 2018-07-03 LAB — URINALYSIS, COMPLETE (UACMP) WITH MICROSCOPIC
BILIRUBIN URINE: NEGATIVE
Bacteria, UA: NONE SEEN
Glucose, UA: NEGATIVE mg/dL
Ketones, ur: NEGATIVE mg/dL
Nitrite: NEGATIVE
Protein, ur: NEGATIVE mg/dL
SPECIFIC GRAVITY, URINE: 1.009 (ref 1.005–1.030)
pH: 5 (ref 5.0–8.0)

## 2018-07-03 LAB — BASIC METABOLIC PANEL
Anion gap: 7 (ref 5–15)
BUN: 14 mg/dL (ref 6–20)
CHLORIDE: 107 mmol/L (ref 98–111)
CO2: 23 mmol/L (ref 22–32)
Calcium: 9.1 mg/dL (ref 8.9–10.3)
Creatinine, Ser: 0.74 mg/dL (ref 0.44–1.00)
GFR calc Af Amer: >60 mL/min (ref >60)
GFR calc non Af Amer: >60 mL/min (ref >60)
Glucose, Bld: 89 mg/dL (ref 70–99)
Potassium: 3.9 mmol/L (ref 3.5–5.1)
Sodium: 137 mmol/L (ref 135–145)

## 2018-07-03 LAB — CBC
HCT: 44.4 % (ref 36.0–46.0)
Hemoglobin: 14.2 g/dL (ref 12.0–15.0)
MCH: 28.1 pg (ref 26.0–34.0)
MCHC: 32 g/dL (ref 30.0–36.0)
MCV: 87.7 fL (ref 80.0–100.0)
NRBC: 0 % (ref 0.0–0.2)
Platelets: 221 10*3/uL (ref 150–400)
RBC: 5.06 MIL/uL (ref 3.87–5.11)
RDW: 12.4 % (ref 11.5–15.5)
WBC: 8.1 10*3/uL (ref 4.0–10.5)

## 2018-07-03 LAB — POCT PREGNANCY, URINE: Preg Test, Ur: NEGATIVE

## 2018-07-03 MED ORDER — ONDANSETRON 4 MG PO TBDP: 4.0000 mg | ORAL_TABLET | Freq: Three times a day (TID) | ORAL | 0 refills | Status: DC | PRN

## 2018-07-03 MED ORDER — LOPERAMIDE HCL 2 MG PO TABS: 4.0000 mg | ORAL_TABLET | Freq: Four times a day (QID) | ORAL | 0 refills | Status: DC | PRN

## 2018-07-03 MED ORDER — HYDROXYZINE PAMOATE 25 MG PO CAPS: 25.0000 mg | ORAL_CAPSULE | Freq: Three times a day (TID) | ORAL | 0 refills | Status: DC | PRN

## 2018-07-03 MED ORDER — SODIUM CHLORIDE 0.9% FLUSH: 3.0000 mL | Freq: Once | INTRAVENOUS | Status: DC

## 2018-07-03 MED ORDER — FAMOTIDINE 20 MG PO TABS
40.0000 mg | ORAL_TABLET | Freq: Once | ORAL | Status: AC
  Administered 2018-07-03: 40 mg via ORAL
  Filled 2018-07-03: qty 2

## 2018-07-03 MED ORDER — ALUM & MAG HYDROXIDE-SIMETH 200-200-20 MG/5ML PO SUSP
30.0000 mL | Freq: Once | ORAL | Status: AC
  Administered 2018-07-03: 30 mL via ORAL
  Filled 2018-07-03: qty 30

## 2018-07-03 NOTE — ED Notes (Signed)
Pt given meal tray and water to drink.  

## 2018-07-03 NOTE — ED Triage Notes (Signed)
Pt states last Friday (8 days ago) she passed out. States since then has been weak and shaky. Also c/o diarrhea. Also c/o HA. States hx of low BP and low potassium.   A&O, ambulatory. No distress noted. Speaking in complete sentences.

## 2018-07-03 NOTE — ED Provider Notes (Signed)
[facility] Emergency Department Provider Note  ____________________________________________  Time seen: Approximately 7:22 PM  I have reviewed the triage vital signs and the nursing notes.   HISTORY  Chief Complaint Loss of Consciousness    HPI [name] is a 23 y.o. female with a history of anxiety GERD who complains of generalized weakness diarrhea and nausea for the past 2 to 3 days.  She also reports an episode of syncope 8 days ago which seems to be unrelated.  She denies any chest pain shortness of breath thunderclap headaches or severe belly pain.  No pelvic pain or vaginal bleeding or discharge.  Diarrhea is watery, provoked by eating.  No alleviating factors.  Also has generalized abdominal pain, nonradiating without aggravating or alleviating factors, mild.      Past Medical History:  Diagnosis Date  . Anemia   . Anxiety   . Bladder infection, acute 01-10-2015   completed antibiotics and denies any s&s of infection (01-30-15)  . Dysrhythmia    occ irregular heart beat per pt  . GERD (gastroesophageal reflux disease)    NO MEDS  . Headache    MIGRAINES DAILY     Patient Active Problem List   Diagnosis Date Noted  . Palpitations 06/07/2018  . Atypical chest pain 06/07/2018  . Anxiety 06/07/2018  . Syncope 04/14/2018  . Periumbilical abdominal pain 03/12/2015  . Gallstones 01/29/2015     Past Surgical History:  Procedure Laterality Date  . ADENOIDECTOMY    . CHOLECYSTECTOMY N/A 02/01/2015   Procedure: LAPAROSCOPIC CHOLECYSTECTOMY WITH INTRAOPERATIVE CHOLANGIOGRAM;  Surgeon: Earline Mayotte, MD;  Location: [facility] ORS;  Service: General;  Laterality: N/A;  . TONSILLECTOMY    . WISDOM TOOTH EXTRACTION       Prior to Admission medications   Medication Sig Start Date End Date Taking? Authorizing Provider  acetaminophen (TYLENOL) 325 MG tablet Take 650 mg by mouth every 6 (six) hours as needed for mild pain, fever or headache.   Yes  [provider]  hydrOXYzine (VISTARIL) 25 MG capsule Take 1 capsule (25 mg total) by mouth 3 (three) times daily as needed for anxiety. 07/03/18   Sharman Cheek, MD  loperamide (IMODIUM A-D) 2 MG tablet Take 2 tablets (4 mg total) by mouth 4 (four) times daily as needed for diarrhea or loose stools. 07/03/18   Sharman Cheek, MD  ondansetron (ZOFRAN ODT) 4 MG disintegrating tablet Take 1 tablet (4 mg total) by mouth every 8 (eight) hours as needed for nausea or vomiting. 07/03/18   Sharman Cheek, MD     Allergies Amoxicillin; Penicillin g; and Penicillins   Family History  Problem Relation Age of Onset  . Healthy Mother   . Hypertension Father   . Hyperlipidemia Father   . Arrhythmia Father   . Cancer Maternal Aunt        breast  . CAD Paternal Grandmother        d. 40  . CAD Paternal Grandfather        d. 77    Social History Social History   Tobacco Use  . Smoking status: Former Smoker    Packs/day: 0.50    Years: 8.00    Pack years: 4.00    Types: Cigarettes  . Smokeless tobacco: Never Used  Substance Use Topics  . Alcohol use: Not Currently    Alcohol/week: 0.0 standard drinks    Comment: seldom  . Drug use: No    Review of Systems  Constitutional:  No fever or chills.  ENT:   No sore throat. No rhinorrhea. Cardiovascular:   No chest pain or syncope. Respiratory:   No dyspnea or cough. Gastrointestinal: Positive as above for abdominal pain and diarrhea.  Musculoskeletal:   Negative for focal pain or swelling All other systems reviewed and are negative except as documented above in ROS and HPI.  ____________________________________________   PHYSICAL EXAM:  VITAL SIGNS: ED Triage Vitals  Enc Vitals Group     BP 07/03/18 1519 (!) 116/92     Pulse Rate 07/03/18 1519 (!) 105     Resp 07/03/18 1519 18     Temp 07/03/18 1519 98.1 F (36.7 C)     Temp Source 07/03/18 1519 Oral     SpO2 07/03/18 1519 100 %     Weight 07/03/18 1520 130  lb (59 kg)     Height 07/03/18 1520 5\' 1"  (1.549 m)     Head Circumference --      Peak Flow --      Pain Score 07/03/18 1520 6     Pain Loc --      Pain Edu? --      Excl. in GC? --     Vital signs reviewed, nursing assessments reviewed.   Constitutional:   Alert and oriented. Non-toxic appearance. Eyes:   Conjunctivae are normal. EOMI. PERRL. ENT      Head:   Normocephalic and atraumatic.      Nose:   No congestion/rhinnorhea.       Mouth/Throat:   Dry mucous membranes, no pharyngeal erythema. No peritonsillar mass.       Neck:   No meningismus. Full ROM. Hematological/Lymphatic/Immunilogical:   No cervical lymphadenopathy. Cardiovascular:   RRR. Symmetric bilateral radial and DP pulses.  No murmurs. Cap refill less than 2 seconds. Respiratory:   Normal respiratory effort without tachypnea/retractions. Breath sounds are clear and equal bilaterally. No wheezes/rales/rhonchi. Gastrointestinal:   Soft with left upper quadrant tenderness, mild. Non distended. There is no CVA tenderness.  No rebound, rigidity, or guarding. Musculoskeletal:   Normal range of motion in all extremities. No joint effusions.  No lower extremity tenderness.  No edema. Neurologic:   Normal speech and language.  Motor grossly intact. No acute focal neurologic deficits are appreciated.  Skin:    Skin is warm, dry and intact. No rash noted.  No petechiae, purpura, or bullae.  ____________________________________________    LABS (pertinent positives/negatives) (all labs ordered are listed, but only abnormal results are displayed) Labs Reviewed  URINALYSIS, COMPLETE (UACMP) WITH MICROSCOPIC - Abnormal; Notable for the following components:      Result Value   Color, Urine YELLOW (*)    APPearance CLEAR (*)    Hgb urine dipstick SMALL (*)    Leukocytes, UA MODERATE (*)    All other components within normal limits  URINE CULTURE  BASIC METABOLIC PANEL  CBC  POC URINE PREG, ED  POCT PREGNANCY, URINE   CBG MONITORING, ED   ____________________________________________   EKG  Interpreted by me  Date: 07/03/2018  Rate: 77  Rhythm: normal sinus rhythm  QRS Axis: normal  Intervals: normal  ST/T Wave abnormalities: normal  Conduction Disutrbances: none  Narrative Interpretation: unremarkable      ____________________________________________    RADIOLOGY  No results found.  ____________________________________________   PROCEDURES Procedures  ____________________________________________    CLINICAL IMPRESSION / ASSESSMENT AND PLAN / ED COURSE  Pertinent labs & imaging results that were available during my care of the  patient were reviewed by me and considered in my medical decision making (see chart for details).    Patient presents with symptoms of viral gastroenteritis.Considering the patient's symptoms, medical history, and physical examination today, I have low suspicion for cholecystitis or biliary pathology, pancreatitis, perforation or bowel obstruction, hernia, intra-abdominal abscess, AAA or dissection, volvulus or intussusception, mesenteric ischemia, or appendicitis.  Related to her syncope, she is previously had a cardiac work-up including stress test and echocardiogram as well as cardiac monitor, which all have been unremarkable.  I think anxiety is a big component of her symptoms today in addition to her viral illness.  She is tolerating oral intake.  Plan to control symptoms, encourage oral hydration, follow-up with primary care.  Clinical Course as of Jul 03 1925  Sat Jul 03, 2018  1758 NAD. Energetic. Mild dehydration related to viral gastroenteritis / ILI. Also requests ativan for her anxiety, which her PCP has advised they will no longer prescribe.    [PS]    Clinical Course User Index [PS] Sharman CheekStafford, Cahlil Sattar, MD     ____________________________________________   FINAL CLINICAL IMPRESSION(S) / ED DIAGNOSES    Final diagnoses:  Viral  gastroenteritis  Orthostatic dizziness  Dehydration     ED Discharge Orders         Ordered    ondansetron (ZOFRAN ODT) 4 MG disintegrating tablet  Every 8 hours PRN     07/03/18 1921    loperamide (IMODIUM A-D) 2 MG tablet  4 times daily PRN     07/03/18 1921    hydrOXYzine (VISTARIL) 25 MG capsule  3 times daily PRN     07/03/18 1921          Portions of this note were generated with dragon dictation software. Dictation errors [place] occur despite best attempts at proofreading.   Sharman CheekStafford, Drayven Marchena, MD 07/03/18 [phone]

## 2018-07-05 LAB — URINE CULTURE

## 2018-12-18 ENCOUNTER — Emergency Department
Admission: EM | Admit: 2018-12-18 | Discharge: 2018-12-18 | Disposition: A | Discharge status: Home / Self Care | Payer: Medicaid Other | Attending: Emergency Medicine | Admitting: Emergency Medicine

## 2018-12-18 ENCOUNTER — Emergency Department: Payer: Medicaid Other

## 2018-12-18 ENCOUNTER — Encounter: Payer: Self-pay | Admitting: Emergency Medicine

## 2018-12-18 DIAGNOSIS — O209 Hemorrhage in early pregnancy, unspecified: Secondary | ICD-10-CM | POA: Diagnosis present

## 2018-12-18 DIAGNOSIS — Z79899 Other long term (current) drug therapy: Secondary | ICD-10-CM | POA: Diagnosis not present

## 2018-12-18 DIAGNOSIS — O208 Other hemorrhage in early pregnancy: Secondary | ICD-10-CM | POA: Insufficient documentation

## 2018-12-18 DIAGNOSIS — O468X1 Other antepartum hemorrhage, first trimester: Secondary | ICD-10-CM

## 2018-12-18 DIAGNOSIS — Z3A01 Less than 8 weeks gestation of pregnancy: Secondary | ICD-10-CM | POA: Insufficient documentation

## 2018-12-18 DIAGNOSIS — O418X10 Other specified disorders of amniotic fluid and membranes, first trimester, not applicable or unspecified: Secondary | ICD-10-CM

## 2018-12-18 DIAGNOSIS — Z87891 Personal history of nicotine dependence: Secondary | ICD-10-CM | POA: Insufficient documentation

## 2018-12-18 LAB — CBC
HCT: 40.7 % (ref 36.0–46.0)
Hemoglobin: 13.2 g/dL (ref 12.0–15.0)
MCH: 28.3 pg (ref 26.0–34.0)
MCHC: 32.4 g/dL (ref 30.0–36.0)
MCV: 87.2 fL (ref 80.0–100.0)
Platelets: 187 10*3/uL (ref 150–400)
RBC: 4.67 MIL/uL (ref 3.87–5.11)
RDW: 12.9 % (ref 11.5–15.5)
WBC: 11.8 10*3/uL — ABNORMAL HIGH (ref 4.0–10.5)
nRBC: 0 % (ref 0.0–0.2)

## 2018-12-18 LAB — COMPREHENSIVE METABOLIC PANEL
ALT: 13 U/L (ref 0–44)
AST: 16 U/L (ref 15–41)
Albumin: 3.8 g/dL (ref 3.5–5.0)
Alkaline Phosphatase: 43 U/L (ref 38–126)
Anion gap: 4 — ABNORMAL LOW (ref 5–15)
BUN: 10 mg/dL (ref 6–20)
CO2: 25 mmol/L (ref 22–32)
Calcium: 8.9 mg/dL (ref 8.9–10.3)
Chloride: 108 mmol/L (ref 98–111)
Creatinine, Ser: 0.73 mg/dL (ref 0.44–1.00)
GFR calc Af Amer: >60 mL/min (ref >60)
GFR calc non Af Amer: >60 mL/min (ref >60)
Glucose, Bld: 98 mg/dL (ref 70–99)
Potassium: 3.8 mmol/L (ref 3.5–5.1)
Sodium: 137 mmol/L (ref 135–145)
Total Bilirubin: 0.3 mg/dL (ref 0.3–1.2)
Total Protein: 6.7 g/dL (ref 6.5–8.1)

## 2018-12-18 LAB — ANTIBODY SCREEN: Antibody Screen: NEGATIVE

## 2018-12-18 LAB — HCG, QUANTITATIVE, PREGNANCY: hCG, Beta Chain, Quant, S: 276226 m[IU]/mL — ABNORMAL HIGH (ref <5)

## 2018-12-18 MED ORDER — RHO D IMMUNE GLOBULIN 1500 UNIT/2ML IJ SOSY
300.0000 ug | PREFILLED_SYRINGE | Freq: Once | INTRAMUSCULAR | Status: AC
  Administered 2018-12-18: 300 ug via INTRAMUSCULAR
  Filled 2018-12-18: qty 2

## 2018-12-18 NOTE — ED Notes (Signed)
Pt signed physical discharge form. 

## 2018-12-18 NOTE — ED Triage Notes (Signed)
Patient c/o vaginal bleeding while [redacted] weeks pregnant. Patient reports she has seen OB Jefm Bryant), and pregnancy is not tubal.

## 2018-12-18 NOTE — ED Provider Notes (Signed)
[facility] Emergency Department Provider Note  ____________________________________________   First MD Initiated Contact with Patient 12/18/18 0455     (approximate)  I have reviewed the triage vital signs and the nursing notes.   HISTORY  Chief Complaint Vaginal Bleeding   HPI [name] is a 23 y.o. female G3, P1 approximately [redacted] weeks pregnant presents to the emergency department acute onset of painless vaginal bleeding tonight.  Patient states bleeding consistent with with the volume associated with a menstrual cycle.  Patient denies any abdominal or pelvic pain.  Patient denies any dizziness.    Past Medical History:  Diagnosis Date  . Anemia   . Anxiety   . Bladder infection, acute 01-10-2015   completed antibiotics and denies any s&s of infection (01-30-15)  . Dysrhythmia    occ irregular heart beat per pt  . GERD (gastroesophageal reflux disease)    NO MEDS  . Headache    MIGRAINES DAILY    Patient Active Problem List   Diagnosis Date Noted  . Palpitations 06/07/2018  . Atypical chest pain 06/07/2018  . Anxiety 06/07/2018  . Syncope 04/14/2018  . Periumbilical abdominal pain 03/12/2015  . Gallstones 01/29/2015    Past Surgical History:  Procedure Laterality Date  . ADENOIDECTOMY    . CHOLECYSTECTOMY N/A 02/01/2015   Procedure: LAPAROSCOPIC CHOLECYSTECTOMY WITH INTRAOPERATIVE CHOLANGIOGRAM;  Surgeon: Earline MayotteJeffrey W Byrnett, MD;  Location: [facility] ORS;  Service: General;  Laterality: N/A;  . TONSILLECTOMY    . WISDOM TOOTH EXTRACTION      Prior to Admission medications   Medication Sig Start Date End Date Taking? Authorizing Provider  acetaminophen (TYLENOL) 325 MG tablet Take 650 mg by mouth every 6 (six) hours as needed for mild pain, fever or headache.    [provider]  hydrOXYzine (VISTARIL) 25 MG capsule Take 1 capsule (25 mg total) by mouth 3 (three) times daily as needed for anxiety. 07/03/18   Sharman CheekStafford, Phillip, MD   loperamide (IMODIUM A-D) 2 MG tablet Take 2 tablets (4 mg total) by mouth 4 (four) times daily as needed for diarrhea or loose stools. 07/03/18   Sharman CheekStafford, Phillip, MD  ondansetron (ZOFRAN ODT) 4 MG disintegrating tablet Take 1 tablet (4 mg total) by mouth every 8 (eight) hours as needed for nausea or vomiting. 07/03/18   Sharman CheekStafford, Phillip, MD    Allergies Amoxicillin, Penicillin g, and Penicillins  Family History  Problem Relation Age of Onset  . Healthy Mother   . Hypertension Father   . Hyperlipidemia Father   . Arrhythmia Father   . Cancer Maternal Aunt        breast  . CAD Paternal Grandmother        d. 6245  . CAD Paternal Grandfather        d. 4954    Social History Social History   Tobacco Use  . Smoking status: Former Smoker    Packs/day: 0.50    Years: 8.00    Pack years: 4.00    Types: Cigarettes  . Smokeless tobacco: Never Used  Substance Use Topics  . Alcohol use: Not Currently    Alcohol/week: 0.0 standard drinks    Comment: seldom  . Drug use: No    Review of Systems Constitutional: No fever/chills Eyes: No visual changes. ENT: No sore throat. Cardiovascular: Denies chest pain. Respiratory: Denies shortness of breath. Gastrointestinal: No abdominal pain.  No nausea, no vomiting.  No diarrhea.  No constipation. Genitourinary: Negative for dysuria.  Positive  for vaginal bleeding Musculoskeletal: Negative for neck pain.  Negative for back pain. Integumentary: Negative for rash. Neurological: Negative for headaches, focal weakness or numbness.  ____________________________________________   PHYSICAL EXAM:  VITAL SIGNS: ED Triage Vitals  Enc Vitals Group     BP 12/18/18 0035 123/75     Pulse Rate 12/18/18 0035 63     Resp 12/18/18 0035 18     Temp 12/18/18 0035 98.2 F (36.8 C)     Temp Source 12/18/18 0035 Oral     SpO2 12/18/18 0035 100 %     Weight 12/18/18 0035 61.2 kg (135 lb)     Height 12/18/18 0035 1.549 m (5\' 1" )     Head Circumference  --      Peak Flow --      Pain Score 12/18/18 0050 3     Pain Loc --      Pain Edu? --      Excl. in GC? --     Constitutional: Alert and oriented. Well appearing and in no acute distress. Eyes: Conjunctivae are normal.  Mouth/Throat: Mucous membranes are moist.  Oropharynx non-erythematous. Neck: No stridor.  Cardiovascular: Normal rate, regular rhythm. Good peripheral circulation. Grossly normal heart sounds. Respiratory: Normal respiratory effort.  No retractions. No audible wheezing. Gastrointestinal: Soft and nontender. No distention.  Genitourinary: Approximate 5 mL's of dark blood noted in the vagina cervical office closed Musculoskeletal: No lower extremity tenderness nor edema. No gross deformities of extremities. Neurologic:  Normal speech and language. No gross focal neurologic deficits are appreciated.  Skin:  Skin is warm, dry and intact. No rash noted. Psychiatric: Mood and affect are normal. Speech and behavior are normal.  ____________________________________________   LABS (all labs ordered are listed, but only abnormal results are displayed)  Labs Reviewed  CBC - Abnormal; Notable for the following components:      Result Value   WBC 11.8 (*)    All other components within normal limits  COMPREHENSIVE METABOLIC PANEL - Abnormal; Notable for the following components:   Anion gap 4 (*)    All other components within normal limits  HCG, QUANTITATIVE, PREGNANCY - Abnormal; Notable for the following components:   hCG, Beta Chain, Quant, S 276,226 (*)    All other components within normal limits  POC URINE PREG, ED  ABO/RH  RHOGAM INJECTION  ANTIBODY SCREEN   ____________________________________________   RADIOLOGY I, Yulee N Annessa Satre, personally viewed and evaluated these images (plain radiographs) as part of my medical decision making, as well as reviewing the written report by the radiologist.  ED MD interpretation: Single live intrauterine pregnancy 7  weeks 5 days heart rate 158.  Small subchorionic hemorrhage without any mass-effect noted per radiologist.  Official radiology report(s): [place] Ob Comp Less 14 Wks  Result Date: 12/18/2018 CLINICAL DATA:  Initial evaluation for acute vaginal bleeding, early pregnancy. EXAM: OBSTETRIC <14 WK US AND TRANSVAGINAL OB US TECHNIQUE: Both transabdominal and transvaginal ultrasound examinations were performed for complete evaluation of the gestation as well as the maternal uterus, adnexal regions, and pelvic cul-de-sac. Transvaginal technique was performed to assess early pregnancy. COMPARISON:  None. FINDINGS: Intrauterine gestational sac: Single Yolk sac:  Present Embryo:  Present Cardiac Activity: Present Heart Rate: 158 bpm CRL: 14.0 mm   7 w   5 d                  US EDC: 08/01/2019 Subchorionic hemorrhage: Small subchorionic hemorrhage measuring 6 x 9 x 10  mm without associated mass effect. Maternal uterus/adnexae: Ovaries within normal limits. No adnexal mass. No free fluid. IMPRESSION: 1. Single viable intrauterine pregnancy as above, estimated gestational age [redacted] weeks and 5 days by crown-rump length, with ultrasound EDC of 08/01/2019. 2. Small subchorionic hemorrhage without associated mass effect. 3. No other acute maternal uterine or adnexal abnormality identified. Electronically Signed   By: Jeannine Boga M.D.   On: 12/18/2018 04:33   US Ob Transvaginal  Result Date: 12/18/2018 CLINICAL DATA:  Initial evaluation for acute vaginal bleeding, early pregnancy. EXAM: OBSTETRIC <14 WK [place] AND TRANSVAGINAL OB US TECHNIQUE: Both transabdominal and transvaginal ultrasound examinations were performed for complete evaluation of the gestation as well as the maternal uterus, adnexal regions, and pelvic cul-de-sac. Transvaginal technique was performed to assess early pregnancy. COMPARISON:  None. FINDINGS: Intrauterine gestational sac: Single Yolk sac:  Present Embryo:  Present Cardiac Activity: Present Heart Rate:  158 bpm CRL: 14.0 mm   7 w   5 d                  [place] EDC: 08/01/2019 Subchorionic hemorrhage: Small subchorionic hemorrhage measuring 6 x 9 x 10 mm without associated mass effect. Maternal uterus/adnexae: Ovaries within normal limits. No adnexal mass. No free fluid. IMPRESSION: 1. Single viable intrauterine pregnancy as above, estimated gestational age [redacted] weeks and 5 days by crown-rump length, with ultrasound EDC of 08/01/2019. 2. Small subchorionic hemorrhage without associated mass effect. 3. No other acute maternal uterine or adnexal abnormality identified. Electronically Signed   By: Jeannine Boga M.D.   On: 12/18/2018 04:33      Procedures   ____________________________________________   INITIAL IMPRESSION / MDM / ASSESSMENT AND PLAN / ED COURSE  As part of my medical decision making, I reviewed the following data within the electronic MEDICAL RECORD NUMBER   23 year old female presented with above-stated history and physical exam secondary to vaginal bleeding within the first trimester.  Concern for possible threatened or impending miscarriage versus ectopic versus subchorionic hematoma.  Ultrasound revealed evidence of a subchorionic hematoma but however no other abnormality.  Patient blood type B- and as such RhoGam was administered.  Patient advised to follow-up with OB/GYN within 48 hours.  ____________________________________________  FINAL CLINICAL IMPRESSION(S) / ED DIAGNOSES  Final diagnoses:  Vaginal bleeding in pregnancy, first trimester  Subchorionic hemorrhage of placenta in first trimester, single or unspecified fetus     MEDICATIONS GIVEN DURING THIS VISIT:  Medications  rho (d) immune globulin (RHIG/RHOPHYLAC) injection 300 mcg (300 mcg Intramuscular Given 12/18/18 0554)     ED Discharge Orders    None      *Please note:  [name] was evaluated in Emergency Department on 12/18/2018 for the symptoms described in the history of present illness. She was  evaluated in the context of the global COVID-19 pandemic, which necessitated consideration that the patient might be at risk for infection with the SARS-CoV-2 virus that causes COVID-19. Institutional protocols and algorithms that pertain to the evaluation of patients at risk for COVID-19 are in a state of rapid change based on information released by regulatory bodies including the CDC and federal and state organizations. These policies and algorithms were followed during the patient's care in the ED.  Some ED evaluations and interventions [place] be delayed as a result of limited staffing during the pandemic.*  Note:  This document was prepared using Dragon voice recognition software and [place] include unintentional dictation errors.   Gregor Hams, MD  12/18/18 0635  

## 2018-12-18 NOTE — ED Notes (Signed)
Called lab to check status of hcg, was informed specimen still in process.

## 2018-12-20 LAB — RHOGAM INJECTION: Unit division: 0

## 2018-12-21 LAB — ABO/RH: ABO/RH(D): B NEG

## 2019-01-06 LAB — OB RESULTS CONSOLE RPR: RPR: NONREACTIVE

## 2019-01-06 LAB — OB RESULTS CONSOLE HIV ANTIBODY (ROUTINE TESTING): HIV: NONREACTIVE

## 2019-01-06 LAB — OB RESULTS CONSOLE GC/CHLAMYDIA: Gonorrhea: NEGATIVE

## 2019-01-06 LAB — OB RESULTS CONSOLE HEPATITIS B SURFACE ANTIGEN: Hepatitis B Surface Ag: NEGATIVE

## 2019-04-21 ENCOUNTER — Other Ambulatory Visit: Payer: Self-pay

## 2019-04-21 ENCOUNTER — Observation Stay
Admission: EM | Admit: 2019-04-21 | Discharge: 2019-04-21 | Disposition: A | Discharge status: Home / Self Care | Payer: Medicaid Other | Attending: Obstetrics and Gynecology | Admitting: Obstetrics and Gynecology

## 2019-04-21 ENCOUNTER — Telehealth: Payer: Self-pay

## 2019-04-21 DIAGNOSIS — N760 Acute vaginitis: Secondary | ICD-10-CM | POA: Diagnosis not present

## 2019-04-21 DIAGNOSIS — R102 Pelvic and perineal pain: Secondary | ICD-10-CM | POA: Diagnosis not present

## 2019-04-21 DIAGNOSIS — Z3A25 25 weeks gestation of pregnancy: Secondary | ICD-10-CM | POA: Diagnosis not present

## 2019-04-21 DIAGNOSIS — M549 Dorsalgia, unspecified: Secondary | ICD-10-CM | POA: Insufficient documentation

## 2019-04-21 DIAGNOSIS — O23592 Infection of other part of genital tract in pregnancy, second trimester: Secondary | ICD-10-CM | POA: Diagnosis not present

## 2019-04-21 DIAGNOSIS — O26892 Other specified pregnancy related conditions, second trimester: Secondary | ICD-10-CM

## 2019-04-21 DIAGNOSIS — B9689 Other specified bacterial agents as the cause of diseases classified elsewhere: Secondary | ICD-10-CM | POA: Diagnosis not present

## 2019-04-21 DIAGNOSIS — Z87891 Personal history of nicotine dependence: Secondary | ICD-10-CM | POA: Insufficient documentation

## 2019-04-21 LAB — WET PREP, GENITAL
Sperm: NONE SEEN
Trich, Wet Prep: NONE SEEN
Yeast Wet Prep HPF POC: NONE SEEN

## 2019-04-21 LAB — URINE DRUG SCREEN, QUALITATIVE (ARMC ONLY)
Amphetamines, Ur Screen: NOT DETECTED
Barbiturates, Ur Screen: NOT DETECTED
Benzodiazepine, Ur Scrn: NOT DETECTED
Cannabinoid 50 Ng, Ur ~~LOC~~: NOT DETECTED
Cocaine Metabolite,Ur ~~LOC~~: NOT DETECTED
MDMA (Ecstasy)Ur Screen: NOT DETECTED
Methadone Scn, Ur: NOT DETECTED
Opiate, Ur Screen: NOT DETECTED
Phencyclidine (PCP) Ur S: NOT DETECTED
Tricyclic, Ur Screen: NOT DETECTED

## 2019-04-21 LAB — URINALYSIS, ROUTINE W REFLEX MICROSCOPIC
Bilirubin Urine: NEGATIVE
Glucose, UA: NEGATIVE mg/dL
Hgb urine dipstick: NEGATIVE
Ketones, ur: NEGATIVE mg/dL
Nitrite: NEGATIVE
Protein, ur: NEGATIVE mg/dL
Specific Gravity, Urine: 1.006 (ref 1.005–1.030)
pH: 6 (ref 5.0–8.0)

## 2019-04-21 LAB — CHLAMYDIA/NGC RT PCR (ARMC ONLY)
Chlamydia Tr: NOT DETECTED
N gonorrhoeae: NOT DETECTED

## 2019-04-21 LAB — FETAL FIBRONECTIN: Fetal Fibronectin: NEGATIVE

## 2019-04-21 MED ORDER — LACTATED RINGERS IV SOLN: 500.0000 mL/h | Freq: Once | INTRAVENOUS | Status: DC

## 2019-04-21 MED ORDER — DOCUSATE SODIUM 100 MG PO CAPS: 100.0000 mg | ORAL_CAPSULE | Freq: Every day | ORAL | Status: DC

## 2019-04-21 MED ORDER — PRENATAL MULTIVITAMIN CH: 1.0000 | ORAL_TABLET | Freq: Every day | ORAL | Status: DC

## 2019-04-21 MED ORDER — ZOLPIDEM TARTRATE 5 MG PO TABS: 5.0000 mg | ORAL_TABLET | Freq: Every evening | ORAL | Status: DC | PRN

## 2019-04-21 MED ORDER — TERBUTALINE SULFATE 1 MG/ML IJ SOLN: 0.2500 mg | Freq: Once | INTRAMUSCULAR | Status: DC | PRN

## 2019-04-21 MED ORDER — ACETAMINOPHEN 325 MG PO TABS: 650.0000 mg | ORAL_TABLET | ORAL | Status: DC | PRN

## 2019-04-21 MED ORDER — CALCIUM CARBONATE ANTACID 500 MG PO CHEW: 2.0000 | CHEWABLE_TABLET | ORAL | Status: DC | PRN

## 2019-04-21 MED ORDER — BETAMETHASONE SOD PHOS & ACET 6 (3-3) MG/ML IJ SUSP: 12.0000 mg | INTRAMUSCULAR | Status: DC

## 2019-04-21 NOTE — OB Triage Note (Signed)
TRIAGE NOTE to rule out Preterm Labor   History of Present Illness: [name] is a 23 y.o. G3P1011 at 1651w4d presenting to triage for preterm pain.  Several days of worsening acute pelvic pain, radiates to back, flank and vaginal. Legs hurt when she tries to walk. No fever, no covid sx. No dysuria or constipation. No hip pain or CVA tenderness. No nausea/vomiting/URI sx.  No decreased fetal movement, vaginal bleeding. Last intercourse last week. +vaginal discharge, no clear leaking.   Patient Active Problem List   Diagnosis Date Noted  . Pelvic pain affecting pregnancy in second trimester, antepartum 04/21/2019  . Palpitations 06/07/2018  . Atypical chest pain 06/07/2018  . Anxiety 06/07/2018  . Syncope 04/14/2018  . Periumbilical abdominal pain 03/12/2015  . Gallstones 01/29/2015    Past Medical History:  Diagnosis Date  . Anemia   . Anxiety   . Bladder infection, acute 01-10-2015   completed antibiotics and denies any s&s of infection (01-30-15)  . Dysrhythmia    occ irregular heart beat per pt  . GERD (gastroesophageal reflux disease)    NO MEDS  . Headache    MIGRAINES DAILY    Past Surgical History:  Procedure Laterality Date  . ADENOIDECTOMY    . CHOLECYSTECTOMY N/A 02/01/2015   Procedure: LAPAROSCOPIC CHOLECYSTECTOMY WITH INTRAOPERATIVE CHOLANGIOGRAM;  Surgeon: Earline MayotteJeffrey W Byrnett, MD;  Location: [facility] ORS;  Service: General;  Laterality: N/A;  . TONSILLECTOMY    . WISDOM TOOTH EXTRACTION      OB History  Gravida Para Term Preterm AB Living  3 1 1  0 1 1  SAB TAB Ectopic Multiple Live Births  1 0 0 0 1    # Outcome Date GA Lbr Len/2nd Weight Sex Delivery Anes PTL Lv  3 Current           2 Term 09/26/14    M Vag-Spont   LIV  1 SAB             Obstetric Comments  1st Menstrual Cycle:  11  1st Pregnancy:  17    Social History   Socioeconomic History  . Marital status: Married    Spouse name: Not on file  . Number of children: Not on file  . Years of  education: Not on file  . Highest education level: Not on file  Occupational History  . Not on file  Social Needs  . Financial resource strain: Not on file  . Food insecurity    Worry: Not on file    Inability: Not on file  . Transportation needs    Medical: Not on file    Non-medical: Not on file  Tobacco Use  . Smoking status: Former Smoker    Packs/day: 0.50    Years: 8.00    Pack years: 4.00    Types: Cigarettes  . Smokeless tobacco: Never Used  Substance and Sexual Activity  . Alcohol use: Not Currently    Alcohol/week: 0.0 standard drinks    Comment: seldom  . Drug use: No  . Sexual activity: Yes    Birth control/protection: None  Lifestyle  . Physical activity    Days per week: Not on file    Minutes per session: Not on file  . Stress: Not on file  Relationships  . Social Musicianconnections    Talks on phone: Not on file    Gets together: Not on file    Attends religious service: Not on file    Active member of club  or organization: Not on file    Attends meetings of clubs or organizations: Not on file    Relationship status: Not on file  Other Topics Concern  . Not on file  Social History Narrative  . Not on file    Family History  Problem Relation Age of Onset  . Healthy Mother   . Hypertension Father   . Hyperlipidemia Father   . Arrhythmia Father   . Cancer Maternal Aunt        breast  . CAD Paternal Grandmother        d. 54  . CAD Paternal Grandfather        d. 71    Allergies  Allergen Reactions  . Amoxicillin Anaphylaxis  . Penicillin G   . Penicillins Anaphylaxis and Swelling    Has patient had a PCN reaction causing immediate rash, facial/tongue/throat swelling, SOB or lightheadedness with hypotension: Yes Has patient had a PCN reaction causing severe rash involving mucus membranes or skin necrosis: No Has patient had a PCN reaction that required hospitalization No Has patient had a PCN reaction occurring within the last 10 years: Yes If  all of the above answers are "NO", then [place] proceed with Cephalosporin use.    Medications Prior to Admission  Medication Sig Dispense Refill Last Dose  . acetaminophen (TYLENOL) 325 MG tablet Take 650 mg by mouth every 6 (six) hours as needed for mild pain, fever or headache.   04/21/2019 at 1200  . hydrOXYzine (VISTARIL) 25 MG capsule Take 1 capsule (25 mg total) by mouth 3 (three) times daily as needed for anxiety. (Patient not taking: Reported on 04/21/2019) 30 capsule 0 Not Taking at Unknown time  . loperamide (IMODIUM A-D) 2 MG tablet Take 2 tablets (4 mg total) by mouth 4 (four) times daily as needed for diarrhea or loose stools. (Patient not taking: Reported on 04/21/2019) 30 tablet 0 Not Taking at Unknown time  . ondansetron (ZOFRAN ODT) 4 MG disintegrating tablet Take 1 tablet (4 mg total) by mouth every 8 (eight) hours as needed for nausea or vomiting. (Patient not taking: Reported on 04/21/2019) 20 tablet 0 Not Taking at Unknown time    Review of Systems - See HPI for OB specific ROS.   Vitals:  BP 110/71 (BP Location: Right Arm)   Pulse 94   Temp 97.8 F (36.6 C) (Oral)   Resp 18   Ht 5\' 1"  (1.549 m)   Wt 66.7 kg   LMP 06/22/2018   BMI 27.78 kg/m  Physical Examination: CONSTITUTIONAL: Well-developed, well-nourished female in no acute distress.  HENT:  Normocephalic, atraumatic EYES: Conjunctivae and EOM are normal. No scleral icterus.  NECK: Normal range of motion, supple, SKIN: Skin is warm and dry. No rash noted. Not diaphoretic. No erythema. No pallor. NEUROLGIC: Alert and oriented to person, place, and time. No gross cranial nerve deficit noted. PSYCHIATRIC: Normal mood and affect. Normal behavior. Normal judgment and thought content. CARDIOVASCULAR: Normal heart rate noted, regular rhythm RESPIRATORY: Effort and breath sounds normal, no problems with respiration noted ABDOMEN: Soft, nontender, nondistended, gravid.  Cervix:  closed/thick/high Membranes:intact Fetal Monitoring:Baseline: 145 bpm, Variability: Fair (1-6 bpm), Accelerations: Non-reactive but appropriate for gestational age and no decels Tocometer: Flat  Labs:  No results found for this or any previous visit (from the past 24 hour(s)).  Imaging Studies: No results found.   Assessment and Plan: Patient Active Problem List   Diagnosis Date Noted  . Pelvic pain affecting pregnancy in second  trimester, antepartum 04/21/2019  . Palpitations 06/07/2018  . Atypical chest pain 06/07/2018  . Anxiety 06/07/2018  . Syncope 04/14/2018  . Periumbilical abdominal pain 03/12/2015  . Gallstones 01/29/2015    1. Wet prep, FNN, G/C swab, and UA sent to lab.  2. Continuous fetal monitoring 3. Continuous toco, no contractions noted.  Angelina Pih, MD, MPH

## 2019-04-21 NOTE — OB Triage Note (Addendum)
Pt presents to the ED c/o abdominal pressure and back pain. Pt is a G3P1011 and [redacted]w[redacted]d. Pt stated she had an appointment with Dr.Beasley in the office but  c/o of back pain 9/10 stating "its constant,hurts in the middle of the back, kinda like labor back pain." Pt also c/o abdominal pain and pressure rating 10/10, stating "I can barely breathe or walk." Pt does not seem to be in immediate distress, resting comfortably on the right side. Pt denies LOF, vaginal bleeding, and states positive fetal movement.  Pt significant other is at bedside. External monitors applied and assessing. Initial FHR 135. VS WNL. B.Beasley aware of pt. Wet prep, FNN, G/C swab, and UA sent to lab. SVE performed by Denver Faster, MD 0/50/ballotable

## 2019-04-21 NOTE — Progress Notes (Addendum)
MD given verbal order to RN for pt to d/c home with antibiotic. B.Beasley sent antibiotic prescription to [place]. Pt verbalized understanding and will pick up med tonight. Pt given AVS and received information regarding Bacterial vaginosis and given preterm labor precautions. Pt verbalized understanding and will return if status changes. Pt d/c home with significant other at bedside.

## 2019-04-23 LAB — URINE CULTURE: Culture: NO GROWTH

## 2019-05-06 NOTE — Discharge Summary (Signed)
Pt presenting with preterm pelvic pain at 25+4, found to have BV, no signs of labor, reassuring fetal testing, and discharged hoem with abx for f/u as already scheduled.

## 2019-05-23 ENCOUNTER — Encounter: Payer: Self-pay | Admitting: Obstetrics and Gynecology

## 2019-05-23 ENCOUNTER — Other Ambulatory Visit: Payer: Self-pay

## 2019-05-23 ENCOUNTER — Observation Stay
Admission: EM | Admit: 2019-05-23 | Discharge: 2019-05-23 | Disposition: A | Discharge status: Home / Self Care | Payer: Medicaid Other

## 2019-05-23 DIAGNOSIS — M5432 Sciatica, left side: Secondary | ICD-10-CM | POA: Diagnosis not present

## 2019-05-23 DIAGNOSIS — Z3A30 30 weeks gestation of pregnancy: Secondary | ICD-10-CM | POA: Insufficient documentation

## 2019-05-23 DIAGNOSIS — Z87891 Personal history of nicotine dependence: Secondary | ICD-10-CM | POA: Diagnosis not present

## 2019-05-23 DIAGNOSIS — O99613 Diseases of the digestive system complicating pregnancy, third trimester: Secondary | ICD-10-CM | POA: Insufficient documentation

## 2019-05-23 DIAGNOSIS — K219 Gastro-esophageal reflux disease without esophagitis: Secondary | ICD-10-CM | POA: Insufficient documentation

## 2019-05-23 DIAGNOSIS — O99891 Other specified diseases and conditions complicating pregnancy: Secondary | ICD-10-CM | POA: Diagnosis present

## 2019-05-23 DIAGNOSIS — O26899 Other specified pregnancy related conditions, unspecified trimester: Secondary | ICD-10-CM | POA: Diagnosis present

## 2019-05-23 DIAGNOSIS — R102 Pelvic and perineal pain: Secondary | ICD-10-CM

## 2019-05-23 LAB — URINALYSIS, COMPLETE (UACMP) WITH MICROSCOPIC
Bilirubin Urine: NEGATIVE
Glucose, UA: NEGATIVE mg/dL
Hgb urine dipstick: NEGATIVE
Ketones, ur: NEGATIVE mg/dL
Nitrite: NEGATIVE
Protein, ur: NEGATIVE mg/dL
Specific Gravity, Urine: 1.014 (ref 1.005–1.030)
pH: 6 (ref 5.0–8.0)

## 2019-05-23 LAB — WET PREP, GENITAL
Clue Cells Wet Prep HPF POC: NONE SEEN
Sperm: NONE SEEN
Trich, Wet Prep: NONE SEEN
Yeast Wet Prep HPF POC: NONE SEEN

## 2019-05-23 LAB — CHLAMYDIA/NGC RT PCR (ARMC ONLY)
Chlamydia Tr: NOT DETECTED
N gonorrhoeae: NOT DETECTED

## 2019-05-23 LAB — OB RESULTS CONSOLE GC/CHLAMYDIA: Chlamydia: NEGATIVE

## 2019-05-23 NOTE — Progress Notes (Signed)
Pt also reports taking diclegis 10mg  PO. Unable to add to med list.

## 2019-05-23 NOTE — Discharge Instructions (Signed)
Sciatica  Sciatica is pain, numbness, weakness, or tingling along the path of the sciatic nerve. The sciatic nerve starts in the lower back and runs down the back of each leg. The nerve controls the muscles in the lower leg and in the back of the knee. It also provides feeling (sensation) to the back of the thigh, the lower leg, and the sole of the foot. Sciatica is a symptom of another medical condition that pinches or puts pressure on the sciatic nerve. Sciatica most often only affects one side of the body. Sciatica usually goes away on its own or with treatment. In some cases, sciatica Clements come back (recur). What are the causes? This condition is caused by pressure on the sciatic nerve or pinching of the nerve. This [place]land be the result of:  A disk in between the bones of the spine bulging out too far (herniated disk).  Age-related changes in the spinal disks.  A pain disorder that affects a muscle in the buttock.  Extra bone growth near the sciatic nerve.  A break (fracture) of the pelvis.  Pregnancy.  Tumor. This is rare. What increases the risk? The following factors Frommer make you more likely to develop this condition:  Playing sports that place pressure or stress on the spine.  Having poor strength and flexibility.  A history of back injury or surgery.  Sitting for long periods of time.  Doing activities that involve repetitive bending or lifting.  Obesity. What are the signs or symptoms? Symptoms can vary from mild to very severe, and they Fineberg include:  Any of these problems in the lower back, leg, hip, or buttock: ? Mild tingling, numbness, or dull aches. ? Burning sensations. ? Sharp pains.  Numbness in the back of the calf or the sole of the foot.  Leg weakness.  Severe back pain that makes movement difficult. Symptoms Riche get worse when you cough, sneeze, or laugh, or when you sit or stand for long periods of time. How is this diagnosed? This condition Cerra be  diagnosed based on:  Your symptoms and medical history.  A physical exam.  Blood tests.  Imaging tests, such as: ? X-rays. ? MRI. ? CT scan. How is this treated? In many cases, this condition improves on its own without treatment. However, treatment Reichenbach include:  Reducing or modifying physical activity.  Exercising and stretching.  Icing and applying heat to the affected area.  Medicines that help to: ? Relieve pain and swelling. ? Relax your muscles.  Injections of medicines that help to relieve pain, irritation, and inflammation around the sciatic nerve (steroids).  Surgery. Follow these instructions at home: Medicines  Take over-the-counter and prescription medicines only as told by your health care provider.  Ask your health care provider if the medicine prescribed to you: ? Requires you to avoid driving or using heavy machinery. ? Can cause constipation. You Scherger need to take these actions to prevent or treat constipation:  Drink enough fluid to keep your urine pale yellow.  Take over-the-counter or prescription medicines.  Eat foods that are high in fiber, such as beans, whole grains, and fresh fruits and vegetables.  Limit foods that are high in fat and processed sugars, such as fried or sweet foods. Managing pain      If directed, put ice on the affected area. ? Put ice in a plastic bag. ? Place a towel between your skin and the bag. ? Leave the ice on for 20 minutes,   2-3 times a day.  If directed, apply heat to the affected area. Use the heat source that your health care provider recommends, such as a moist heat pack or a heating pad. ? Place a towel between your skin and the heat source. ? Leave the heat on for 20-30 minutes. ? Remove the heat if your skin turns bright red. This is especially important if you are unable to feel pain, heat, or cold. You Bayliss have a greater risk of getting burned. Activity   Return to your normal activities as told  by your health care provider. Ask your health care provider what activities are safe for you.  Avoid activities that make your symptoms worse.  Take brief periods of rest throughout the day. ? When you rest for longer periods, mix in some mild activity or stretching between periods of rest. This will help to prevent stiffness and pain. ? Avoid sitting for long periods of time without moving. Get up and move around at least one time each hour.  Exercise and stretch regularly, as told by your health care provider.  Do not lift anything that is heavier than 10 lb (4.5 kg) while you have symptoms of sciatica. When you do not have symptoms, you should still avoid heavy lifting, especially repetitive heavy lifting.  When you lift objects, always use proper lifting technique, which includes: ? Bending your knees. ? Keeping the load close to your body. ? Avoiding twisting. General instructions  Maintain a healthy weight. Excess weight puts extra stress on your back.  Wear supportive, comfortable shoes. Avoid wearing high heels.  Avoid sleeping on a mattress that is too soft or too hard. A mattress that is firm enough to support your back when you sleep Boulter help to reduce your pain.  Keep all follow-up visits as told by your health care provider. This is important. Contact a health care provider if:  You have pain that: ? Wakes you up when you are sleeping. ? Gets worse when you lie down. ? Is worse than you have experienced in the past. ? Lasts longer than 4 weeks.  You have an unexplained weight loss. Get help right away if:  You are not able to control when you urinate or have bowel movements (incontinence).  You have: ? Weakness in your lower back, pelvis, buttocks, or legs that gets worse. ? Redness or swelling of your back. ? A burning sensation when you urinate. Summary  Sciatica is pain, numbness, weakness, or tingling along the path of the sciatic nerve.  This condition  is caused by pressure on the sciatic nerve or pinching of the nerve.  Sciatica can cause pain, numbness, or tingling in the lower back, legs, hips, and buttocks.  Treatment often includes rest, exercise, medicines, and applying ice or heat. This information is not intended to replace advice given to you by your health care provider. Make sure you discuss any questions you have with your health care provider. Document Released: 05/20/2001 Document Revised: 06/14/2018 Document Reviewed: 06/14/2018 Elsevier Patient Education  [place].   Round Ligament Pain  The round ligament is a cord of muscle and tissue that helps support the uterus. It can become a source of pain during pregnancy if it becomes stretched or twisted as the baby grows. The pain usually begins in the second trimester (13-28 weeks) of pregnancy, and it can come and go until the baby is delivered. It is not a serious problem, and it does not cause  harm to the baby. Round ligament pain is usually a short, sharp, and pinching pain, but it can also be a dull, lingering, and aching pain. The pain is felt in the lower side of the abdomen or in the groin. It usually starts deep in the groin and moves up to the outside of the hip area. The pain Scaff occur when you:  Suddenly change position, such as quickly going from a sitting to standing position.  Roll over in bed.  Cough or sneeze.  Do physical activity. Follow these instructions at home:   Watch your condition for any changes.  When the pain starts, relax. Then try any of these methods to help with the pain: ? Sitting down. ? Flexing your knees up to your abdomen. ? Lying on your side with one pillow under your abdomen and another pillow between your legs. ? Sitting in a warm bath for 15-20 minutes or until the pain goes away.  Take over-the-counter and prescription medicines only as told by your health care provider.  Move slowly when you sit down or stand  up.  Avoid long walks if they cause pain.  Stop or reduce your physical activities if they cause pain.  Keep all follow-up visits as told by your health care provider. This is important. Contact a health care provider if:  Your pain does not go away with treatment.  You feel pain in your back that you did not have before.  Your medicine is not helping. Get help right away if:  You have a fever or chills.  You develop uterine contractions.  You have vaginal bleeding.  You have nausea or vomiting.  You have diarrhea.  You have pain when you urinate. Summary  Round ligament pain is felt in the lower abdomen or groin. It is usually a short, sharp, and pinching pain. It can also be a dull, lingering, and aching pain.  This pain usually begins in the second trimester (13-28 weeks). It occurs because the uterus is stretching with the growing baby, and it is not harmful to the baby.  You Denherder notice the pain when you suddenly change position, when you cough or sneeze, or during physical activity.  Relaxing, flexing your knees to your abdomen, lying on one side, or taking a warm bath Zavalza help to get rid of the pain.  Get help from your health care provider if the pain does not go away or if you have vaginal bleeding, nausea, vomiting, diarrhea, or painful urination. This information is not intended to replace advice given to you by your health care provider. Make sure you discuss any questions you have with your health care provider. Document Released: 03/04/2008 Document Revised: 11/11/2017 Document Reviewed: 11/11/2017 Elsevier Patient Education  2020 [employer].

## 2019-05-23 NOTE — OB Triage Note (Signed)
Pt is a 23y/o G3P1011 at [redacted]w[redacted]d with c/o abdominal and vaginal pain that began yesterday that has gotten worse today. Pt states that when ctx come they come for a couple of minutes and pt feel pressure in vaginal area and then it goes away for 10-15 minutes. Pt states having some dish Pt states +FM, however less than normal. Pt states having some discharge but doesn't believe it to be her water. PT denies LOF, and VB. Monitors applied and assessing. Initial FHT 150.

## 2019-05-27 NOTE — Discharge Summary (Signed)
[name], CNM  Certified Nurse Midwife  Obstetrics  Death Summary Note  Addendum  Date of Service: 24-May-2019 9:39 PM                                                                         [name] is a 23 y.o. female. She is at [redacted]w[redacted]d gestation. Patient's last menstrual period was 06/22/2018.  Estimated Date of Delivery: 07/31/19  Prenatal care site: [facility]  Chief complaint: lower abd pain and back pain that comes and goes, shooting lightening pain that travels to the vagina when standing up or moving, also reports shooting pain that travels down the left buttock and midway through the thigh.  Onset/timing: Started yesterday  Duration: Intermittent pain  Aggravating or alleviating conditions: Has tried tylenol and warm baths without improvement  Associated signs/symptoms: denies LOF or vaginal bleeding.  Context: States that pain shoots from her thigh to vagina when she tries to stand up. Feels like it's a sharp, shooting pain that makes it difficult to move. She doesn't remember having this happen with her first pregnancy. She has also been having lower abdominal and back pressure/pain that comes and goes. She reports feeling a lot of rectal pressure that's uncomfortable. Reports a history of IBS and denies any difficulty having a bowel movement. Last bowel movement was yesterday.  S: Resting comfortably.  She reports:  -active fetal movement  -no leakage of fluid, reports clear vaginal discharge  -no vaginal bleeding  -unsure if she is having contractions   Maternal Medical History:      Past Medical History:  Diagnosis Date  . Anemia   . Anxiety   . Bladder infection, acute 01-10-2015   completed antibiotics and denies any s&s of infection (01-30-15)  . Dysrhythmia    occ irregular heart beat per pt  . GERD (gastroesophageal reflux disease)    NO MEDS  . Headache    MIGRAINES DAILY          Past Surgical History:  Procedure Laterality Date  . ADENOIDECTOMY    . CHOLECYSTECTOMY N/A 02/01/2015   Procedure: LAPAROSCOPIC CHOLECYSTECTOMY WITH INTRAOPERATIVE CHOLANGIOGRAM; Surgeon: Earline Mayotte, MD; Location: [facility] ORS; Service: General; Laterality: N/A;  . CHOLECYSTECTOMY    . TONSILLECTOMY    . WISDOM TOOTH EXTRACTION           Allergies  Allergen Reactions  . Amoxicillin Anaphylaxis  . Penicillin G   . Penicillins Anaphylaxis and Swelling    Has patient had a PCN reaction causing immediate rash, facial/tongue/throat swelling, SOB or lightheadedness with hypotension: Yes  Has patient had a PCN reaction causing severe rash involving mucus membranes or skin necrosis: No  Has patient had a PCN reaction that required hospitalization No  Has patient had a PCN reaction occurring within the last 10 years: Yes  If all of the above answers are "NO", then Vert proceed with Cephalosporin use.           Prior to Admission medications   Medication Sig Start Date End Date Taking? Authorizing Provider  acetaminophen (TYLENOL) 325 MG tablet Take 650 mg by mouth every 6 (six) hours as needed for mild pain, fever or  headache.   Yes [provider]  famotidine (PEPCID) 10 MG tablet Take 10 mg by mouth 2 (two) times daily.   Yes [provider]  ferrous sulfate (FER-IN-SOL) 75 (15 Fe) MG/ML SOLN Take by mouth.   Yes [provider]  hydrOXYzine (VISTARIL) 25 MG capsule Take 1 capsule (25 mg total) by mouth 3 (three) times daily as needed for anxiety.  Patient not taking: Reported on 04/21/2019 07/03/18   Sharman CheekStafford, Phillip, MD  loperamide (IMODIUM A-D) 2 MG tablet Take 2 tablets (4 mg total) by mouth 4 (four) times daily as needed for diarrhea or loose stools.  Patient not taking: Reported on 04/21/2019 07/03/18   Sharman CheekStafford, Phillip, MD  ondansetron (ZOFRAN ODT) 4 MG disintegrating tablet Take 1 tablet (4 mg total) by mouth every 8 (eight) hours as needed for  nausea or vomiting.  Patient not taking: Reported on 04/21/2019 07/03/18   Sharman CheekStafford, Phillip, MD  Social History: She reports that she has quit smoking. Her smoking use included cigarettes. She has a 4.00 pack-year smoking history. She has never used smokeless tobacco. She reports previous alcohol use. She reports that she does not use drugs.  Family History: family history includes Arrhythmia in her father; CAD in her paternal grandfather and paternal grandmother; Cancer in her maternal aunt; Healthy in her mother; Hyperlipidemia in her father; Hypertension in her father. She has no history of gyn cancers  Review of Systems: A full review of systems was performed and negative except as noted in the HPI.  O:  BP 106/68  Pulse 80  Temp 98.8 F (37.1 C) (Oral)  Resp 18  Ht 5\' 1"  (1.549 m)  Wt 72.6 kg  LMP 06/22/2018  BMI 30.23 kg/m        Results for orders placed or performed during the hospital encounter of May 14, 2019 (from the past 48 hour(s))  Wet prep, genital   Collection Time: May 14, 2019 8:07 PM  Result Value Ref Range   Yeast Wet Prep HPF POC NONE SEEN NONE SEEN   Trich, Wet Prep NONE SEEN NONE SEEN   Clue Cells Wet Prep HPF POC NONE SEEN NONE SEEN   WBC, Wet Prep HPF POC RARE (A) NONE SEEN   Sperm NONE SEEN   Urinalysis, Complete w Microscopic   Collection Time: May 14, 2019 8:07 PM  Result Value Ref Range   Color, Urine YELLOW (A) YELLOW   APPearance HAZY (A) CLEAR   Specific Gravity, Urine 1.014 1.005 - 1.030   pH 6.0 5.0 - 8.0   Glucose, UA NEGATIVE NEGATIVE mg/dL   Hgb urine dipstick NEGATIVE NEGATIVE   Bilirubin Urine NEGATIVE NEGATIVE   Ketones, ur NEGATIVE NEGATIVE mg/dL   Protein, ur NEGATIVE NEGATIVE mg/dL   Nitrite NEGATIVE NEGATIVE   Leukocytes,Ua TRACE (A) NEGATIVE   RBC / HPF 0-5 0 - 5 RBC/hpf   WBC, UA 0-5 0 - 5 WBC/hpf   Bacteria, UA RARE (A) NONE SEEN   Squamous Epithelial / LPF 6-10 0 - 5   Mucus PRESENT    Constitutional: NAD, AAOx3  HE/ENT: extraocular  movements grossly intact, moist mucous membranes  CV: RRR  PULM: normal respiratory effort, CTABL  Abd: gravid, non-tender, non-distended, soft  Ext: Non-tender, Nonedmeatous  Psych: mood appropriate, speech normal  Cervix: Closed/thick/high/posterior  NST:  Baseline: 135 bpm  Variability: moderate  Accelerations: 15x15 present x >2  Decelerations: absent  Toco: occasional uterine irritability  A/P: 23 y.o. 10423w1d here for antenatal surveillance during pregnancy.  Principle diagnosis:  PTL Labor  Not present Fetal Wellbeing  Reactive NST, reassuring for GA D/c home stable, precautions reviewed, follow-up as scheduled.  Sciatic pain  Discussed comfort measures. Encouraged to use pillows for positioning, maternity support belt, and reviewed stretches to help with hip and back pain. Instructed to notify provider for worsening symptoms, contractions, LOF, vaginal bleeding, or decreased fetal movement.  F/U in office as scheduled tomorrow for [facility] appointment.  Minda Meo  2019/05/31 9:48 PM  -----  Minda Meo, CNM  Certified Nurse Midwife  [facility], Department of [facility]   Electronically signed by Minda Meo, CNM at 05/31/2019 10:13 PM  Electronically signed by Minda Meo, CNM at 31-May-2019 10:16 PM        ED to Hosp-Admission (Discharged) on May 31, 2019   Revision History   Detailed Report

## 2019-06-10 NOTE — L&D Delivery Note (Signed)
Delivery Note  [name] is a [id number] at [redacted]w[redacted]d, dated by LMP of 10/24/18.   First Stage: Labor onset: 1330 Induction : AROM and oxytocin  Analgesia /Anesthesia intrapartum: epidural AROM at 1332  Second Stage: Complete dilation at 1800 Onset of pushing at 1803 FHR second stage 120 bpm with moderate variability, accels present, intermittent variables   Presented to L&D for scheduled IOL for elective at term.  She progressed well on pitocin and AROM to c/c/+2.  Good maternal effort with pushing.  Delivery of a viable baby boy 07/28/19 at 1808 by CNM, Dr. Feliberto Gottron present for delivery.  Delivery of fetal head in OA position with restitution to LOT. No nuchal cord;  Anterior then posterior shoulders delivered easily with gentle downward traction. Baby placed on mom's chest, and attended to by baby RN.  Cord double clamped after cessation of pulsation, cut by father of baby Cord blood sample collected and sent to lab for evaluation.   Collection of cord blood donation: N/A Arterial cord blood sample: N/A  Third Stage: Placenta delivered schultz side intact with 3 VC @ 1812 Placenta disposition: discarded  Uterine tone firm / bleeding moderate   1st degree vaginal laceration identified  Anesthesia for repair: epidural Repair with 3-0 vicryl SH x 1  Est. Blood Loss (mL): 100  Complications: none  Mom to postpartum.  Baby to Couplet care / Skin to Skin.  Newborn:  Birth Weight: 7lbs 14oz (3570g)  Apgar Scores: 8/9 Feeding planned: breast   __________ [name], CNM Certified Nurse Midwife [facility]

## 2019-06-10 NOTE — Death Summary Note (Deleted)
[name] is a 24 y.o. female. She is at [redacted]w[redacted]d gestation. Patient's last menstrual period was 06/22/2018. Estimated Date of Delivery: 07/31/19  Prenatal care site: [facility]   Chief complaint: lower abd pain and back pain that comes and goes, shooting lightening pain that travels to the vagina when standing up or moving, also reports shooting pain that travels down the left buttock and midway through the thigh.   Onset/timing: Started yesterday Duration: Intermittent pain Aggravating or alleviating conditions: Has tried tylenol and warm baths without improvement  Associated signs/symptoms: denies LOF or vaginal bleeding.  Context: States that pain shoots from her thigh to vagina when she tries to stand up. Feels like it's a sharp, shooting pain that makes it difficult to move.  She doesn't remember having this happen with her first pregnancy. She has also been having lower abdominal and back pressure/pain that comes and goes.  She reports feeling a lot of rectal pressure that's uncomfortable.  Reports a history of IBS and denies any difficulty having a bowel movement. Last bowel movement was yesterday.    S: Resting comfortably.   She reports:  -active fetal movement -no leakage of fluid, reports clear vaginal discharge  -no vaginal bleeding -unsure if she is having contractions   Maternal Medical History:   Past Medical History:  Diagnosis Date  . Anemia   . Anxiety   . Bladder infection, acute 01-10-2015   completed antibiotics and denies any s&s of infection (01-30-15)  . Dysrhythmia    occ irregular heart beat per pt  . GERD (gastroesophageal reflux disease)    NO MEDS  . Headache    MIGRAINES DAILY    Past Surgical History:  Procedure Laterality Date  . ADENOIDECTOMY    . CHOLECYSTECTOMY N/A 02/01/2015   Procedure: LAPAROSCOPIC CHOLECYSTECTOMY WITH INTRAOPERATIVE CHOLANGIOGRAM;  Surgeon: Earline Mayotte, MD;  Location: [facility] ORS;  Service: General;   Laterality: N/A;  . CHOLECYSTECTOMY    . TONSILLECTOMY    . WISDOM TOOTH EXTRACTION      Allergies  Allergen Reactions  . Amoxicillin Anaphylaxis  . Penicillin G   . Penicillins Anaphylaxis and Swelling    Has patient had a PCN reaction causing immediate rash, facial/tongue/throat swelling, SOB or lightheadedness with hypotension: Yes Has patient had a PCN reaction causing severe rash involving mucus membranes or skin necrosis: No Has patient had a PCN reaction that required hospitalization No Has patient had a PCN reaction occurring within the last 10 years: Yes If all of the above answers are "NO", then [place] proceed with Cephalosporin use.    Prior to Admission medications   Medication Sig Start Date End Date Taking? Authorizing Provider  acetaminophen (TYLENOL) 325 MG tablet Take 650 mg by mouth every 6 (six) hours as needed for mild pain, fever or headache.   Yes [provider]  famotidine (PEPCID) 10 MG tablet Take 10 mg by mouth 2 (two) times daily.   Yes [provider]  ferrous sulfate (FER-IN-SOL) 75 (15 Fe) MG/ML SOLN Take by mouth.   Yes [provider]  hydrOXYzine (VISTARIL) 25 MG capsule Take 1 capsule (25 mg total) by mouth 3 (three) times daily as needed for anxiety. Patient not taking: Reported on 04/21/2019 07/03/18   Sharman Cheek, MD  loperamide (IMODIUM A-D) 2 MG tablet Take 2 tablets (4 mg total) by mouth 4 (four) times daily as needed for diarrhea or loose stools. Patient not taking: Reported on 04/21/2019 07/03/18  Carrie Mew, MD  ondansetron (ZOFRAN ODT) 4 MG disintegrating tablet Take 1 tablet (4 mg total) by mouth every 8 (eight) hours as needed for nausea or vomiting. Patient not taking: Reported on 04/21/2019 07/03/18   Carrie Mew, MD     Social History: She  reports that she has quit smoking. Her smoking use included cigarettes. She has a 4.00 pack-year smoking history. She has never used smokeless tobacco. She  reports previous alcohol use. She reports that she does not use drugs.  Family History: family history includes Arrhythmia in her father; CAD in her paternal grandfather and paternal grandmother; Cancer in her maternal aunt; Healthy in her mother; Hyperlipidemia in her father; Hypertension in her father. She has no history of gyn cancers   Review of Systems: A full review of systems was performed and negative except as noted in the HPI.    O:  BP 106/68   Pulse 80   Temp 98.8 F (37.1 C) (Oral)   Resp 18   Ht 5\' 1"  (1.549 m)   Wt 72.6 kg   LMP 06/22/2018   BMI 30.23 kg/m  Results for orders placed or performed during the hospital encounter of 05/26/19 (from the past 48 hour(s))  Wet prep, genital   Collection Time: May 26, 2019  8:07 PM  Result Value Ref Range   Yeast Wet Prep HPF POC NONE SEEN NONE SEEN   Trich, Wet Prep NONE SEEN NONE SEEN   Clue Cells Wet Prep HPF POC NONE SEEN NONE SEEN   WBC, Wet Prep HPF POC RARE (A) NONE SEEN   Sperm NONE SEEN   Urinalysis, Complete w Microscopic   Collection Time: 05/26/2019  8:07 PM  Result Value Ref Range   Color, Urine YELLOW (A) YELLOW   APPearance HAZY (A) CLEAR   Specific Gravity, Urine 1.014 1.005 - 1.030   pH 6.0 5.0 - 8.0   Glucose, UA NEGATIVE NEGATIVE mg/dL   Hgb urine dipstick NEGATIVE NEGATIVE   Bilirubin Urine NEGATIVE NEGATIVE   Ketones, ur NEGATIVE NEGATIVE mg/dL   Protein, ur NEGATIVE NEGATIVE mg/dL   Nitrite NEGATIVE NEGATIVE   Leukocytes,Ua TRACE (A) NEGATIVE   RBC / HPF 0-5 0 - 5 RBC/hpf   WBC, UA 0-5 0 - 5 WBC/hpf   Bacteria, UA RARE (A) NONE SEEN   Squamous Epithelial / LPF 6-10 0 - 5   Mucus PRESENT      Constitutional: NAD, AAOx3  HE/ENT: extraocular movements grossly intact, moist mucous membranes CV: RRR PULM: normal respiratory effort, CTABL     Abd: gravid, non-tender, non-distended, soft      Ext: Non-tender, Nonedmeatous   Psych: mood appropriate, speech normal Cervix:  Closed/thick/high/posterior  NST:  Baseline: 135 bpm Variability: moderate Accelerations: 15x15 present x >2 Decelerations: absent Toco: occasional uterine irritability    A/P: 24 y.o. [redacted]w[redacted]d here for antenatal surveillance during pregnancy.  Principle diagnosis:   PTL Labor  Not present  Fetal Wellbeing  Reactive NST, reassuring for GA  D/c home stable, precautions reviewed, follow-up as scheduled.   Sciatic pain   Discussed comfort measures.  Encouraged to use pillows for positioning, maternity support belt, and reviewed stretches to help with hip and back pain.  Instructed to notify provider for worsening symptoms, contractions, LOF, vaginal bleeding, or decreased fetal movement.   F/U in office as scheduled tomorrow for [facility] appointment.   Minda Meo 05-26-2019 9:48 PM  ----- Minda Meo, CNM Certified Nurse Midwife [facility], Department of OB/GYN [place]  [facility]

## 2019-06-10 DEATH — deceased

## 2019-06-19 ENCOUNTER — Observation Stay
Admission: EM | Admit: 2019-06-19 | Discharge: 2019-06-19 | Disposition: A | Discharge status: Home / Self Care | Payer: Medicaid Other | Attending: Certified Nurse Midwife | Admitting: Certified Nurse Midwife

## 2019-06-19 ENCOUNTER — Other Ambulatory Visit: Payer: Self-pay

## 2019-06-19 ENCOUNTER — Encounter: Payer: Self-pay | Admitting: Obstetrics and Gynecology

## 2019-06-19 DIAGNOSIS — O99613 Diseases of the digestive system complicating pregnancy, third trimester: Secondary | ICD-10-CM | POA: Insufficient documentation

## 2019-06-19 DIAGNOSIS — R102 Pelvic and perineal pain: Secondary | ICD-10-CM | POA: Diagnosis not present

## 2019-06-19 DIAGNOSIS — Z79899 Other long term (current) drug therapy: Secondary | ICD-10-CM | POA: Diagnosis not present

## 2019-06-19 DIAGNOSIS — K219 Gastro-esophageal reflux disease without esophagitis: Secondary | ICD-10-CM | POA: Diagnosis not present

## 2019-06-19 DIAGNOSIS — M545 Low back pain: Secondary | ICD-10-CM | POA: Diagnosis not present

## 2019-06-19 DIAGNOSIS — Z87891 Personal history of nicotine dependence: Secondary | ICD-10-CM | POA: Insufficient documentation

## 2019-06-19 DIAGNOSIS — M549 Dorsalgia, unspecified: Secondary | ICD-10-CM | POA: Diagnosis present

## 2019-06-19 DIAGNOSIS — O99891 Other specified diseases and conditions complicating pregnancy: Secondary | ICD-10-CM | POA: Diagnosis present

## 2019-06-19 DIAGNOSIS — O26892 Other specified pregnancy related conditions, second trimester: Secondary | ICD-10-CM

## 2019-06-19 DIAGNOSIS — Z3A34 34 weeks gestation of pregnancy: Secondary | ICD-10-CM | POA: Diagnosis not present

## 2019-06-19 DIAGNOSIS — O99013 Anemia complicating pregnancy, third trimester: Secondary | ICD-10-CM | POA: Insufficient documentation

## 2019-06-19 LAB — WET PREP, GENITAL
Clue Cells Wet Prep HPF POC: NONE SEEN
Sperm: NONE SEEN
Trich, Wet Prep: NONE SEEN
Yeast Wet Prep HPF POC: NONE SEEN

## 2019-06-19 LAB — URINALYSIS, COMPLETE (UACMP) WITH MICROSCOPIC
Bacteria, UA: NONE SEEN
Bilirubin Urine: NEGATIVE
Glucose, UA: NEGATIVE mg/dL
Hgb urine dipstick: NEGATIVE
Ketones, ur: NEGATIVE mg/dL
Nitrite: NEGATIVE
Protein, ur: 30 mg/dL — AB
Specific Gravity, Urine: 1.019 (ref 1.005–1.030)
pH: 7 (ref 5.0–8.0)

## 2019-06-19 LAB — RUPTURE OF MEMBRANE (ROM)PLUS: Rom Plus: NEGATIVE

## 2019-06-19 MED ORDER — ACETAMINOPHEN 500 MG PO TABS
1000.0000 mg | ORAL_TABLET | Freq: Four times a day (QID) | ORAL | Status: DC | PRN
  Administered 2019-06-19: 1000 mg via ORAL
  Filled 2019-06-19: qty 2

## 2019-06-19 MED ORDER — ACETAMINOPHEN 500 MG PO TABS: 1000.0000 mg | ORAL_TABLET | Freq: Four times a day (QID) | ORAL | Status: DC | PRN

## 2019-06-19 MED ORDER — CYCLOBENZAPRINE HCL 10 MG PO TABS
5.0000 mg | ORAL_TABLET | Freq: Three times a day (TID) | ORAL | Status: DC | PRN
  Filled 2019-06-19: qty 0.5

## 2019-06-19 NOTE — Discharge Summary (Signed)
[name] is a 24 y.o. female. She is at [redacted]w[redacted]d gestation. Patient's last menstrual period was 10/24/2018. Estimated Date of Delivery: 07/31/19  Prenatal care site: [facility]   Chief complaint: contractions Location: abdomen Onset/timing: started around 1800 today Duration: intermittent, every 15-20 minutes, lasting 3-4 minutes Severity: mild to moderate Aggravating or alleviating conditions: none Associated signs/symptoms: leakage of fluid, back pain with contractions, vaginal pain and pressure Context: [name] reports contractions that started about 3 hours ago. They having been coming every 15-20 minutes and lasting for 3-4 minutes. She reports lower back pain with the contractions. She also reports vaginal pain and pressure. She also reports that she fell and hit her belly yesterday, but the baby has been moving like normal so she was not worried. She does reports some leakage of clear fluid that was running down her legs at one point yesterday.   S: Resting comfortably.   She reports:  -active fetal movement -no vaginal bleeding  Maternal Medical History:   Past Medical History:  Diagnosis Date  . Anemia   . Anxiety   . Bladder infection, acute 01-10-2015   completed antibiotics and denies any s&s of infection (01-30-15)  . Dysrhythmia    occ irregular heart beat per pt  . GERD (gastroesophageal reflux disease)    NO MEDS  . Headache    MIGRAINES DAILY    Past Surgical History:  Procedure Laterality Date  . ADENOIDECTOMY    . CHOLECYSTECTOMY N/A 02/01/2015   Procedure: LAPAROSCOPIC CHOLECYSTECTOMY WITH INTRAOPERATIVE CHOLANGIOGRAM;  Surgeon: Robert Bellow, MD;  Location: [facility] ORS;  Service: General;  Laterality: N/A;  . CHOLECYSTECTOMY    . TONSILLECTOMY    . WISDOM TOOTH EXTRACTION      Allergies  Allergen Reactions  . Amoxicillin Anaphylaxis  . Penicillin G   . Penicillins Anaphylaxis and Swelling    Has patient had a PCN reaction causing  immediate rash, facial/tongue/throat swelling, SOB or lightheadedness with hypotension: Yes Has patient had a PCN reaction causing severe rash involving mucus membranes or skin necrosis: No Has patient had a PCN reaction that required hospitalization No Has patient had a PCN reaction occurring within the last 10 years: Yes If all of the above answers are "NO", then [place] proceed with Cephalosporin use.    Prior to Admission medications   Medication Sig Start Date End Date Taking? Authorizing Provider  acetaminophen (TYLENOL) 325 MG tablet Take 650 mg by mouth every 6 (six) hours as needed for mild pain, fever or headache.   Yes [provider]  famotidine (PEPCID) 10 MG tablet Take 10 mg by mouth 2 (two) times daily.   Yes [provider]  ferrous sulfate (FER-IN-SOL) 75 (15 Fe) MG/ML SOLN Take by mouth.   Yes [provider]  hydrOXYzine (VISTARIL) 25 MG capsule Take 1 capsule (25 mg total) by mouth 3 (three) times daily as needed for anxiety. Patient not taking: Reported on 04/21/2019 07/03/18   Carrie Mew, MD  loperamide (IMODIUM A-D) 2 MG tablet Take 2 tablets (4 mg total) by mouth 4 (four) times daily as needed for diarrhea or loose stools. Patient not taking: Reported on 04/21/2019 07/03/18   Carrie Mew, MD  ondansetron (ZOFRAN ODT) 4 MG disintegrating tablet Take 1 tablet (4 mg total) by mouth every 8 (eight) hours as needed for nausea or vomiting. Patient not taking: Reported on 04/21/2019 07/03/18   Carrie Mew, MD     Social History: She  reports that  she has quit smoking. Her smoking use included cigarettes. She has a 4.00 pack-year smoking history. She has never used smokeless tobacco. She reports previous alcohol use. She reports that she does not use drugs.  Family History: family history includes Arrhythmia in her father; CAD in her paternal grandfather and paternal grandmother; Cancer in her maternal aunt; Healthy in her mother;  Hyperlipidemia in her father; Hypertension in her father.   Review of Systems: A full review of systems was performed and negative except as noted in the HPI.     O:  BP 106/72 (BP Location: Left Arm)   Pulse 90   Temp 97.9 F (36.6 C) (Oral)   Resp 18   Ht 5\' 1"  (1.549 m)   Wt 73.5 kg   LMP 10/24/2018   BMI 30.61 kg/m  Results for orders placed or performed during the hospital encounter of 06/19/19 (from the past 48 hour(s))  Wet prep, genital   Collection Time: 06/19/19  8:37 PM   Specimen: Vaginal  Result Value Ref Range   Yeast Wet Prep HPF POC NONE SEEN NONE SEEN   Trich, Wet Prep NONE SEEN NONE SEEN   Clue Cells Wet Prep HPF POC NONE SEEN NONE SEEN   WBC, Wet Prep HPF POC MANY (A) NONE SEEN   Sperm NONE SEEN   ROM Plus ([facility] only)   Collection Time: 06/19/19  8:37 PM  Result Value Ref Range   Rom Plus NEGATIVE   Urinalysis, Complete w Microscopic   Collection Time: 06/19/19  8:37 PM  Result Value Ref Range   Color, Urine YELLOW (A) YELLOW   APPearance HAZY (A) CLEAR   Specific Gravity, Urine 1.019 1.005 - 1.030   pH 7.0 5.0 - 8.0   Glucose, UA NEGATIVE NEGATIVE mg/dL   Hgb urine dipstick NEGATIVE NEGATIVE   Bilirubin Urine NEGATIVE NEGATIVE   Ketones, ur NEGATIVE NEGATIVE mg/dL   Protein, ur 30 (A) NEGATIVE mg/dL   Nitrite NEGATIVE NEGATIVE   Leukocytes,Ua MODERATE (A) NEGATIVE   RBC / HPF 0-5 0 - 5 RBC/hpf   WBC, UA 11-20 0 - 5 WBC/hpf   Bacteria, UA NONE SEEN NONE SEEN   Squamous Epithelial / LPF 6-10 0 - 5   Mucus PRESENT      Constitutional: NAD, AAOx3  HE/ENT: extraocular movements grossly intact, moist mucous membranes CV: RRR PULM: normal respiratory effort, CTABL     Abd: gravid, non-tender, non-distended, soft      Ext: Non-tender, Nonedmeatous   Psych: mood appropriate, speech normal Pelvic: SSE: moderate amount of physiologic discharge, no pooling, negative fern, no fluid per cervix with Valsalva, cervix visually closed  NST:  Baseline:  130bpm Variability: moderate Accelerations: 15x15 present x >2 Decelerations: absent Time: 08/17/19 Toco: quiet   A/P: 24 y.o. [redacted]w[redacted]d here for antenatal surveillance during pregnancy.  Principle diagnosis: musculoskeletal discomforts of pregnancy   Labor  Not present  Fetal Wellbeing  Reactive NST, reassuring for GA  Concern for leakage of fluid  Wet prep WNL, no evidence of bacterial vaginosis or yeast overgrowth  ROM Plus negative  Pain  UA with no nitrites, moderate leukocytes, 0-5 WBCs, 11-20 WBCs, no bacteria, and 6-10 squamous epithelial cells; urine culture added to rule out UTI  Advised Tylenol PRN, adequate hydration (60-90 ounces of water a day), and pregnancy support band for pelvic and vaginal pain.   D/c home stable, precautions reviewed, follow-up as scheduled.    [redacted]w[redacted]d 06/19/2019 10:32 PM  ----- 08/17/2019, CNM  Certified Nurse Midwife [facility], Department of OB/GYN [facility]

## 2019-06-19 NOTE — Progress Notes (Addendum)
[name] is a 24 y.o. female. She is at [redacted]w[redacted]d gestation. Patient's last menstrual period was 10/24/2018. Estimated Date of Delivery: 07/31/19  Prenatal care site: [facility]   Chief complaint: contractions Location: abdomen Onset/timing: started around 1800 today Duration: intermittent, every 15-20 minutes, lasting 3-4 minutes Severity: mild to moderate Aggravating or alleviating conditions: none Associated signs/symptoms: leakage of fluid, back pain with contractions, vaginal pain and pressure Context: [name] reports contractions that started about 3 hours ago. They having been coming every 15-20 minutes and lasting for 3-4 minutes. She reports lower back pain with the contractions. She also reports vaginal pain and pressure. She also reports that she fell and hit her belly yesterday, but the baby has been moving like normal so she was not worried. She does reports some leakage of clear fluid that was running down her legs at one point yesterday.   S: Resting comfortably.   She reports:  -active fetal movement -no vaginal bleeding  Maternal Medical History:   Past Medical History:  Diagnosis Date  . Anemia   . Anxiety   . Bladder infection, acute 01-10-2015   completed antibiotics and denies any s&s of infection (01-30-15)  . Dysrhythmia    occ irregular heart beat per pt  . GERD (gastroesophageal reflux disease)    NO MEDS  . Headache    MIGRAINES DAILY    Past Surgical History:  Procedure Laterality Date  . ADENOIDECTOMY    . CHOLECYSTECTOMY N/A 02/01/2015   Procedure: LAPAROSCOPIC CHOLECYSTECTOMY WITH INTRAOPERATIVE CHOLANGIOGRAM;  Surgeon: Robert Bellow, MD;  Location: [facility] ORS;  Service: General;  Laterality: N/A;  . CHOLECYSTECTOMY    . TONSILLECTOMY    . WISDOM TOOTH EXTRACTION      Allergies  Allergen Reactions  . Amoxicillin Anaphylaxis  . Penicillin G   . Penicillins Anaphylaxis and Swelling    Has patient had a PCN reaction causing  immediate rash, facial/tongue/throat swelling, SOB or lightheadedness with hypotension: Yes Has patient had a PCN reaction causing severe rash involving mucus membranes or skin necrosis: No Has patient had a PCN reaction that required hospitalization No Has patient had a PCN reaction occurring within the last 10 years: Yes If all of the above answers are "NO", then [place] proceed with Cephalosporin use.    Prior to Admission medications   Medication Sig Start Date End Date Taking? Authorizing Provider  acetaminophen (TYLENOL) 325 MG tablet Take 650 mg by mouth every 6 (six) hours as needed for mild pain, fever or headache.   Yes [provider]  famotidine (PEPCID) 10 MG tablet Take 10 mg by mouth 2 (two) times daily.   Yes [provider]  ferrous sulfate (FER-IN-SOL) 75 (15 Fe) MG/ML SOLN Take by mouth.   Yes [provider]  hydrOXYzine (VISTARIL) 25 MG capsule Take 1 capsule (25 mg total) by mouth 3 (three) times daily as needed for anxiety. Patient not taking: Reported on 04/21/2019 07/03/18   Carrie Mew, MD  loperamide (IMODIUM A-D) 2 MG tablet Take 2 tablets (4 mg total) by mouth 4 (four) times daily as needed for diarrhea or loose stools. Patient not taking: Reported on 04/21/2019 07/03/18   Carrie Mew, MD  ondansetron (ZOFRAN ODT) 4 MG disintegrating tablet Take 1 tablet (4 mg total) by mouth every 8 (eight) hours as needed for nausea or vomiting. Patient not taking: Reported on 04/21/2019 07/03/18   Carrie Mew, MD     Social History: She  reports that  she has quit smoking. Her smoking use included cigarettes. She has a 4.00 pack-year smoking history. She has never used smokeless tobacco. She reports previous alcohol use. She reports that she does not use drugs.  Family History: family history includes Arrhythmia in her father; CAD in her paternal grandfather and paternal grandmother; Cancer in her maternal aunt; Healthy in her mother;  Hyperlipidemia in her father; Hypertension in her father.   Review of Systems: A full review of systems was performed and negative except as noted in the HPI.     O:  BP 106/72 (BP Location: Left Arm)   Pulse 90   Temp 97.9 F (36.6 C) (Oral)   Resp 18   Ht 5\' 1"  (1.549 m)   Wt 73.5 kg   LMP 10/24/2018   BMI 30.61 kg/m  Results for orders placed or performed during the hospital encounter of 06/19/19 (from the past 48 hour(s))  Wet prep, genital   Collection Time: 06/19/19  8:37 PM   Specimen: Vaginal  Result Value Ref Range   Yeast Wet Prep HPF POC NONE SEEN NONE SEEN   Trich, Wet Prep NONE SEEN NONE SEEN   Clue Cells Wet Prep HPF POC NONE SEEN NONE SEEN   WBC, Wet Prep HPF POC MANY (A) NONE SEEN   Sperm NONE SEEN   ROM Plus ([facility] only)   Collection Time: 06/19/19  8:37 PM  Result Value Ref Range   Rom Plus NEGATIVE   Urinalysis, Complete w Microscopic   Collection Time: 06/19/19  8:37 PM  Result Value Ref Range   Color, Urine YELLOW (A) YELLOW   APPearance HAZY (A) CLEAR   Specific Gravity, Urine 1.019 1.005 - 1.030   pH 7.0 5.0 - 8.0   Glucose, UA NEGATIVE NEGATIVE mg/dL   Hgb urine dipstick NEGATIVE NEGATIVE   Bilirubin Urine NEGATIVE NEGATIVE   Ketones, ur NEGATIVE NEGATIVE mg/dL   Protein, ur 30 (A) NEGATIVE mg/dL   Nitrite NEGATIVE NEGATIVE   Leukocytes,Ua MODERATE (A) NEGATIVE   RBC / HPF 0-5 0 - 5 RBC/hpf   WBC, UA 11-20 0 - 5 WBC/hpf   Bacteria, UA NONE SEEN NONE SEEN   Squamous Epithelial / LPF 6-10 0 - 5   Mucus PRESENT      Constitutional: NAD, AAOx3  HE/ENT: extraocular movements grossly intact, moist mucous membranes CV: RRR PULM: normal respiratory effort, CTABL     Abd: gravid, non-tender, non-distended, soft      Ext: Non-tender, Nonedmeatous   Psych: mood appropriate, speech normal Pelvic: SSE: moderate amount of physiologic discharge, no pooling, negative fern, no fluid per cervix with Valsalva, cervix visually closed  NST:  Baseline:  130bpm Variability: moderate Accelerations: 15x15 present x >2 Decelerations: absent Time: 08/17/19 Toco: quiet   A/P: 24 y.o. [redacted]w[redacted]d here for antenatal surveillance during pregnancy.  Principle diagnosis: musculoskeletal discomforts of pregnancy   Labor  Not present  Fetal Wellbeing  Reactive NST, reassuring for GA  Concern for leakage of fluid  Wet prep WNL, no evidence of bacterial vaginosis or yeast overgrowth  ROM Plus negative  Pain  UA with no nitrites, moderate leukocytes, 0-5 WBCs, 11-20 WBCs, no bacteria, and 6-10 squamous epithelial cells; urine culture added to rule out UTI  Advised Tylenol PRN, adequate hydration (60-90 ounces of water a day), and pregnancy support band for pelvic and vaginal pain.   D/c home stable, precautions reviewed, follow-up as scheduled.    [redacted]w[redacted]d 06/19/2019 9:15 PM  ----- 08/17/2019, CNM  Certified Nurse Midwife [facility], Department of OB/GYN [facility]

## 2019-06-19 NOTE — Discharge Instructions (Signed)
Pelvic Pain, Female Pelvic pain is pain in your lower belly (abdomen), below your belly button and between your hips. The pain Knopf start suddenly (be acute), keep coming back (be recurring), or last a long time (become chronic). Pelvic pain that lasts longer than 6 months is called chronic pelvic pain. There are many causes of pelvic pain. Sometimes the cause of pelvic pain is not known. Follow these instructions at home:   Take over-the-counter and prescription medicines only as told by your doctor.  Rest as told by your doctor.  Do not have sex if it hurts.  Keep a journal of your pelvic pain. Write down: ? When the pain started. ? Where the pain is located. ? What seems to make the pain better or worse, such as food or your period (menstrual cycle). ? Any symptoms you have along with the pain.  Keep all follow-up visits as told by your doctor. This is important. Contact a doctor if:  Medicine does not help your pain.  Your pain comes back.  You have new symptoms.  You have unusual discharge or bleeding from your vagina.  You have a fever or chills.  You are having trouble pooping (constipation).  You have blood in your pee (urine) or poop (stool).  Your pee smells bad.  You feel weak or light-headed. Get help right away if:  You have sudden pain that is very bad.  Your pain keeps getting worse.  You have very bad pain and also have any of these symptoms: ? A fever. ? Feeling sick to your stomach (nausea). ? Throwing up (vomiting). ? Being very sweaty.  You pass out (lose consciousness). Summary  Pelvic pain is pain in your lower belly (abdomen), below your belly button and between your hips.  There are many possible causes of pelvic pain.  Keep a journal of your pelvic pain. This information is not intended to replace advice given to you by your health care provider. Make sure you discuss any questions you have with your health care provider.  Braxton  Hicks Contractions Contractions of the uterus can occur throughout pregnancy, but they are not always a sign that you are in labor. You Flythe have practice contractions called Braxton Hicks contractions. These false labor contractions are sometimes confused with true labor. What are [place] contractions? Braxton Hicks contractions are tightening movements that occur in the muscles of the uterus before labor. Unlike true labor contractions, these contractions do not result in opening (dilation) and thinning of the cervix. Toward the end of pregnancy (32-34 weeks), Braxton Hicks contractions can happen more often and Arabie become stronger. These contractions are sometimes difficult to tell apart from true labor because they can be very uncomfortable. You should not feel embarrassed if you go to the hospital with false labor. Sometimes, the only way to tell if you are in true labor is for your health care provider to look for changes in the cervix. The health care provider will do a physical exam and Meditz monitor your contractions. If you are not in true labor, the exam should show that your cervix is not dilating and your water has not broken. If there are no other health problems associated with your pregnancy, it is completely safe for you to be sent home with false labor. You Hotz continue to have Braxton Hicks contractions until you go into true labor. How to tell the difference between true labor and false labor True labor  Contractions last 30-70 seconds.  Contractions become very regular.  Discomfort is usually felt in the top of the uterus, and it spreads to the lower abdomen and low back.  Contractions do not go away with walking.  Contractions usually become more intense and increase in frequency.  The cervix dilates and gets thinner. False labor  Contractions are usually shorter and not as strong as true labor contractions.  Contractions are usually irregular.  Contractions are  often felt in the front of the lower abdomen and in the groin.  Contractions Branton go away when you walk around or change positions while lying down.  Contractions get weaker and are shorter-lasting as time goes on.  The cervix usually does not dilate or become thin. Follow these instructions at home:   Take over-the-counter and prescription medicines only as told by your health care provider.  Keep up with your usual exercises and follow other instructions from your health care provider.  Eat and drink lightly if you think you are going into labor.  If Braxton Hicks contractions are making you uncomfortable: ? Change your position from lying down or resting to walking, or change from walking to resting. ? Sit and rest in a tub of warm water. ? Drink enough fluid to keep your urine pale yellow. Dehydration Kraemer cause these contractions. ? Do slow and deep breathing several times an hour.  Keep all follow-up prenatal visits as told by your health care provider. This is important. Contact a health care provider if:  You have a fever.  You have continuous pain in your abdomen. Get help right away if:  Your contractions become stronger, more regular, and closer together.  You have fluid leaking or gushing from your vagina.  You pass blood-tinged mucus (bloody show).  You have bleeding from your vagina.  You have low back pain that you never had before.  You feel your baby's head pushing down and causing pelvic pressure.  Your baby is not moving inside you as much as it used to. Summary  Contractions that occur before labor are called Braxton Hicks contractions, false labor, or practice contractions.  Braxton Hicks contractions are usually shorter, weaker, farther apart, and less regular than true labor contractions. True labor contractions usually become progressively stronger and regular, and they become more frequent.  Manage discomfort from [facility] contractions by  changing position, resting in a warm bath, drinking plenty of water, or practicing deep breathing. This information is not intended to replace advice given to you by your health care provider. Make sure you discuss any questions you have with your health care provider. Document Revised: 05/08/2017 Document Reviewed: 10/09/2016 Elsevier Patient Education  2020 Elsevier Inc.  Document Revised: 11/11/2017 Document Reviewed: 11/11/2017 Elsevier Patient Education  2020 [employer].

## 2019-06-19 NOTE — OB Triage Note (Signed)
Pt is a G3P1 seen at 34 wks w/ c/o of LOF and ctx that started yesterday after she fell and hit her belly. Pt states "fluid was clear and running down her legs." Pt denies vaginal bleeding and states positive fetal movement. Monitors applied and assessing. Initial fht 135.

## 2019-06-22 LAB — URINE CULTURE: Culture: >=100000 — AB

## 2019-07-19 ENCOUNTER — Other Ambulatory Visit: Payer: Self-pay

## 2019-07-19 NOTE — Progress Notes (Signed)
[id number] with 07/31/2019, by Last Menstrual Period Scheduled for elective induction of labor for on 07/28/19.   Prenatal provider: [facility] Pregnancy complicated by: 1. Varicella equivocal 2. Rh negative - rhogam given at 28 weeks  Prenatal Labs: Blood type/Rh B neg  Antibody screen neg  Rubella Immune  Varicella Non-Immune  RPR NR  HBsAg Neg  HIV NR  GC neg  Chlamydia neg  Genetic screening negative  1 hour GTT 127  3 hour GTT N/A  GBS Neg    Tdap: given 05/10/19 Flu: declined  Contraception: BTL  Feeding preference: Breast   ____ [name], CNM Certified Nurse Midwife [facility]

## 2019-07-22 ENCOUNTER — Observation Stay: Payer: Medicaid Other

## 2019-07-22 ENCOUNTER — Observation Stay
Admission: EM | Admit: 2019-07-22 | Discharge: 2019-07-22 | Disposition: A | Discharge status: Home / Self Care | Payer: Medicaid Other | Attending: Certified Nurse Midwife | Admitting: Certified Nurse Midwife

## 2019-07-22 ENCOUNTER — Encounter: Payer: Self-pay | Admitting: Obstetrics and Gynecology

## 2019-07-22 ENCOUNTER — Other Ambulatory Visit: Payer: Self-pay

## 2019-07-22 DIAGNOSIS — F419 Anxiety disorder, unspecified: Secondary | ICD-10-CM | POA: Diagnosis not present

## 2019-07-22 DIAGNOSIS — Z809 Family history of malignant neoplasm, unspecified: Secondary | ICD-10-CM | POA: Insufficient documentation

## 2019-07-22 DIAGNOSIS — Z3A38 38 weeks gestation of pregnancy: Secondary | ICD-10-CM | POA: Diagnosis not present

## 2019-07-22 DIAGNOSIS — G43909 Migraine, unspecified, not intractable, without status migrainosus: Secondary | ICD-10-CM | POA: Insufficient documentation

## 2019-07-22 DIAGNOSIS — Z79899 Other long term (current) drug therapy: Secondary | ICD-10-CM | POA: Insufficient documentation

## 2019-07-22 DIAGNOSIS — Z880 Allergy status to penicillin: Secondary | ICD-10-CM | POA: Insufficient documentation

## 2019-07-22 DIAGNOSIS — K219 Gastro-esophageal reflux disease without esophagitis: Secondary | ICD-10-CM | POA: Insufficient documentation

## 2019-07-22 DIAGNOSIS — Z8249 Family history of ischemic heart disease and other diseases of the circulatory system: Secondary | ICD-10-CM | POA: Insufficient documentation

## 2019-07-22 DIAGNOSIS — R110 Nausea: Secondary | ICD-10-CM | POA: Insufficient documentation

## 2019-07-22 DIAGNOSIS — O26899 Other specified pregnancy related conditions, unspecified trimester: Secondary | ICD-10-CM | POA: Diagnosis present

## 2019-07-22 DIAGNOSIS — O26893 Other specified pregnancy related conditions, third trimester: Secondary | ICD-10-CM | POA: Diagnosis not present

## 2019-07-22 DIAGNOSIS — Z87891 Personal history of nicotine dependence: Secondary | ICD-10-CM | POA: Diagnosis not present

## 2019-07-22 DIAGNOSIS — O26892 Other specified pregnancy related conditions, second trimester: Secondary | ICD-10-CM

## 2019-07-22 DIAGNOSIS — R109 Unspecified abdominal pain: Secondary | ICD-10-CM | POA: Insufficient documentation

## 2019-07-22 DIAGNOSIS — Z9049 Acquired absence of other specified parts of digestive tract: Secondary | ICD-10-CM | POA: Insufficient documentation

## 2019-07-22 LAB — URINALYSIS, COMPLETE (UACMP) WITH MICROSCOPIC
Bilirubin Urine: NEGATIVE
Glucose, UA: NEGATIVE mg/dL
Hgb urine dipstick: NEGATIVE
Ketones, ur: NEGATIVE mg/dL
Nitrite: NEGATIVE
Protein, ur: NEGATIVE mg/dL
Specific Gravity, Urine: 1.006 (ref 1.005–1.030)
pH: 7 (ref 5.0–8.0)

## 2019-07-22 LAB — COMPREHENSIVE METABOLIC PANEL
ALT: 11 U/L (ref 0–44)
AST: 20 U/L (ref 15–41)
Albumin: 2.8 g/dL — ABNORMAL LOW (ref 3.5–5.0)
Alkaline Phosphatase: 104 U/L (ref 38–126)
Anion gap: 8 (ref 5–15)
BUN: 8 mg/dL (ref 6–20)
CO2: 22 mmol/L (ref 22–32)
Calcium: 8.1 mg/dL — ABNORMAL LOW (ref 8.9–10.3)
Chloride: 106 mmol/L (ref 98–111)
Creatinine, Ser: 0.72 mg/dL (ref 0.44–1.00)
GFR calc Af Amer: >60 mL/min (ref >60)
GFR calc non Af Amer: >60 mL/min (ref >60)
Glucose, Bld: 91 mg/dL (ref 70–99)
Potassium: 3.9 mmol/L (ref 3.5–5.1)
Sodium: 136 mmol/L (ref 135–145)
Total Bilirubin: 0.3 mg/dL (ref 0.3–1.2)
Total Protein: 6.3 g/dL — ABNORMAL LOW (ref 6.5–8.1)

## 2019-07-22 LAB — CBC
HCT: 32.8 % — ABNORMAL LOW (ref 36.0–46.0)
Hemoglobin: 9.9 g/dL — ABNORMAL LOW (ref 12.0–15.0)
MCH: 25.1 pg — ABNORMAL LOW (ref 26.0–34.0)
MCHC: 30.2 g/dL (ref 30.0–36.0)
MCV: 83.2 fL (ref 80.0–100.0)
Platelets: 230 10*3/uL (ref 150–400)
RBC: 3.94 MIL/uL (ref 3.87–5.11)
RDW: 14.6 % (ref 11.5–15.5)
WBC: 13.5 10*3/uL — ABNORMAL HIGH (ref 4.0–10.5)
nRBC: 0 % (ref 0.0–0.2)

## 2019-07-22 LAB — LIPASE, BLOOD: Lipase: 34 U/L (ref 11–51)

## 2019-07-22 LAB — AMYLASE: Amylase: 85 U/L (ref 28–100)

## 2019-07-22 MED ORDER — ACETAMINOPHEN 500 MG PO TABS: 1000.0000 mg | ORAL_TABLET | Freq: Once | ORAL | Status: DC

## 2019-07-22 NOTE — Progress Notes (Signed)
Pt back from [place] and in Observation RM 3

## 2019-07-22 NOTE — Progress Notes (Signed)
Pt off the unit and to [place] per M.Haviland, CNM order.

## 2019-07-22 NOTE — Discharge Summary (Signed)
[name] is a 24 y.o. female. She is at [redacted]w[redacted]d gestation. Patient's last menstrual period was 10/24/2018. Estimated Date of Delivery: 07/31/19  Prenatal care site: [facility]   Chief complaint: abdominal pain Location: low abdomen Onset/timing: yesterday Duration: intermittent Quality: cramping, pressure Severity: moderate to severe Aggravating or alleviating conditions: eating and drinking make it worse Associated signs/symptoms: back pain, upper abdominal tingling, nausea with episodes of pain Context: [name] has had intermittent pelvic pain and pressure throughout her pregnancy. She reports that this episode of pain started yesterday and the pain has been coming and going since. It is mostly in her low belly and back. Eating and drinking seem to make it worse. She is taking Pepcid for heartburn and this pain doesn't feel like heartburn to her at all. She has taken Tylenol, used a heating pad, and taken a warm bath or shower, none of which seem to give her relief. She reports no dysuria, no constipation.   S: Resting comfortably.   She reports:  -active fetal movement -no leakage of fluid -no vaginal bleeding -no contractions  Maternal Medical History:   Past Medical History:  Diagnosis Date  . Anemia   . Anxiety   . Bladder infection, acute 01-10-2015   completed antibiotics and denies any s&s of infection (01-30-15)  . Dysrhythmia    occ irregular heart beat per pt  . GERD (gastroesophageal reflux disease)    NO MEDS  . Headache    MIGRAINES DAILY    Past Surgical History:  Procedure Laterality Date  . ADENOIDECTOMY    . CHOLECYSTECTOMY N/A 02/01/2015   Procedure: LAPAROSCOPIC CHOLECYSTECTOMY WITH INTRAOPERATIVE CHOLANGIOGRAM;  Surgeon: Earline Mayotte, MD;  Location: [facility] ORS;  Service: General;  Laterality: N/A;  . CHOLECYSTECTOMY    . TONSILLECTOMY    . WISDOM TOOTH EXTRACTION      Allergies  Allergen Reactions  . Amoxicillin Anaphylaxis   . Penicillin G   . Penicillins Anaphylaxis and Swelling    Has patient had a PCN reaction causing immediate rash, facial/tongue/throat swelling, SOB or lightheadedness with hypotension: Yes Has patient had a PCN reaction causing severe rash involving mucus membranes or skin necrosis: No Has patient had a PCN reaction that required hospitalization No Has patient had a PCN reaction occurring within the last 10 years: Yes If all of the above answers are "NO", then [place] proceed with Cephalosporin use.    Prior to Admission medications   Medication Sig Start Date End Date Taking? Authorizing Provider  acetaminophen (TYLENOL) 500 MG tablet Take 2 tablets (1,000 mg total) by mouth every 6 (six) hours as needed for mild pain or moderate pain. 06/19/19  Yes Genia Del, CNM  Doxylamine-Pyridoxine (DICLEGIS) 10-10 MG TBEC Take by mouth.   Yes [provider]  famotidine (PEPCID) 10 MG tablet Take 10 mg by mouth 2 (two) times daily.   Yes [provider]  ferrous sulfate (FER-IN-SOL) 75 (15 Fe) MG/ML SOLN Take by mouth.   Yes [provider]  Prenatal Vit-Fe Fumarate-FA (PRENATAL MULTIVITAMIN) TABS tablet Take 1 tablet by mouth daily at 12 noon.   Yes [provider]     Social History: She  reports that she has quit smoking. Her smoking use included cigarettes. She has a 4.00 pack-year smoking history. She has never used smokeless tobacco. She reports previous alcohol use. She reports that she does not use drugs.  Family History: family history includes Arrhythmia in her father; CAD in her paternal  grandfather and paternal grandmother; Cancer in her maternal aunt; Healthy in her mother; Hyperlipidemia in her father; Hypertension in her father.   Review of Systems: A full review of systems was performed and negative except as noted in the HPI.     O:  BP 100/62 (BP Location: Left Arm)   Pulse (!) 135   Temp (!) 97.4 F (36.3 C) (Oral)   Resp 18   Ht 5'  1" (1.549 m)   Wt 77.1 kg   LMP 10/24/2018   SpO2 98%   BMI 32.12 kg/m  Results for orders placed or performed during the hospital encounter of 07/22/19 (from the past 48 hour(s))  Comprehensive metabolic panel   Collection Time: 07/22/19  1:13 PM  Result Value Ref Range   Sodium 136 135 - 145 mmol/L   Potassium 3.9 3.5 - 5.1 mmol/L   Chloride 106 98 - 111 mmol/L   CO2 22 22 - 32 mmol/L   Glucose, Bld 91 70 - 99 mg/dL   BUN 8 6 - 20 mg/dL   Creatinine, Ser 4.68 0.44 - 1.00 mg/dL   Calcium 8.1 (L) 8.9 - 10.3 mg/dL   Total Protein 6.3 (L) 6.5 - 8.1 g/dL   Albumin 2.8 (L) 3.5 - 5.0 g/dL   AST 20 15 - 41 U/L   ALT 11 0 - 44 U/L   Alkaline Phosphatase 104 38 - 126 U/L   Total Bilirubin 0.3 0.3 - 1.2 mg/dL   GFR calc non Af Amer >60 >60 mL/min   GFR calc Af Amer >60 >60 mL/min   Anion gap 8 5 - 15  CBC on admission   Collection Time: 07/22/19  1:13 PM  Result Value Ref Range   WBC 13.5 (H) 4.0 - 10.5 K/uL   RBC 3.94 3.87 - 5.11 MIL/uL   Hemoglobin 9.9 (L) 12.0 - 15.0 g/dL   HCT 03.2 (L) 12.2 - 48.2 %   MCV 83.2 80.0 - 100.0 fL   MCH 25.1 (L) 26.0 - 34.0 pg   MCHC 30.2 30.0 - 36.0 g/dL   RDW 50.0 37.0 - 48.8 %   Platelets 230 150 - 400 K/uL   nRBC 0.0 0.0 - 0.2 %  Amylase   Collection Time: 07/22/19  1:13 PM  Result Value Ref Range   Amylase 85 28 - 100 U/L  Lipase, blood   Collection Time: 07/22/19  1:13 PM  Result Value Ref Range   Lipase 34 11 - 51 U/L  Urinalysis, Complete w Microscopic   Collection Time: 07/22/19  1:19 PM  Result Value Ref Range   Color, Urine STRAW (A) YELLOW   APPearance HAZY (A) CLEAR   Specific Gravity, Urine 1.006 1.005 - 1.030   pH 7.0 5.0 - 8.0   Glucose, UA NEGATIVE NEGATIVE mg/dL   Hgb urine dipstick NEGATIVE NEGATIVE   Bilirubin Urine NEGATIVE NEGATIVE   Ketones, ur NEGATIVE NEGATIVE mg/dL   Protein, ur NEGATIVE NEGATIVE mg/dL   Nitrite NEGATIVE NEGATIVE   Leukocytes,Ua LARGE (A) NEGATIVE   RBC / HPF 0-5 0 - 5 RBC/hpf   WBC, UA  6-10 0 - 5 WBC/hpf   Bacteria, UA RARE (A) NONE SEEN   Squamous Epithelial / LPF 0-5 0 - 5   Mucus PRESENT      Constitutional: NAD, AAOx3  HE/ENT: extraocular movements grossly intact, moist mucous membranes CV: RRR PULM: normal respiratory effort, CTABL  Abd: gravid, diffuse mild tenderness, non-distended, soft   Back: symmetric, +R CVA tenderness                                       Ext: Non-tender, Nonedmeatous                     Psych: mood appropriate, speech normal Pelvic deferred, cervical exam FTP/thick/posterior per RN at 1212  NST/monitoring:  Baseline: 130bpm Variability: moderate Accelerations: 15x15s present regularly Decelerations: variable x1, then none Time: 2 hour 31mins Toco: uterine irritability   A/P: 24 y.o. [redacted]w[redacted]d here for antenatal surveillance during pregnancy.  Principle diagnosis: musculoskeletal discomfort during pregnancy  Labor  Not present  IOL scheduled for 07/28/2019.   Fetal Wellbeing  Reactive NST, reassuring for GA  Variable deceleration x1, category I tracing since  OB limited ultrasound completed: AFI 16.5cm  Pain ? UA with large leukocytes, rare bacteria. Urine culture added to rule out UTI. ? CMP, amylase, and lipase WNL. CBC demonstrates anemia and mild leukocytosis, not unusual for pregnancy. Encouraged patient to take iron supplement in addition to prenatal vitamin.  ? Advised Tylenol PRN, adequate hydration (60-90 ounces of water a day), and pregnancy support band for pelvic and vaginal pain.   D/c home stable, precautions reviewed, follow-up as scheduled.    Reviewed patient's presentation, history, labs, and imaging with Dr. Leafy Ro, who is in agreement with the above plan.   Lisette Grinder 07/22/2019 3:31 PM  ----- Lisette Grinder, CNM Certified Nurse Midwife [facility], Department of [facility]

## 2019-07-22 NOTE — Progress Notes (Signed)
Pt discharge home per M.Haviland, CNM order. Pt given AVS and discharge instructions. Pt given labor precautions and signs and symptoms. Pt verbalized understanding. Pt given eduction of continuing taking iron supplements and to hydrate. Pt has a scheduled induction 07/28/2019 but given instructions to return if pt has any concerns. Pt in stable condition discharged home via personal vehicle with mom at bedside.

## 2019-07-22 NOTE — Progress Notes (Signed)
[name] is a 24 y.o. female. She is at [redacted]w[redacted]d gestation. Patient's last menstrual period was 10/24/2018. Estimated Date of Delivery: 07/31/19  Prenatal care site: [facility]   Chief complaint: abdominal pain Location: low abdomen Onset/timing: yesterday Duration: intermittent Quality: cramping, pressure Severity: moderate to severe Aggravating or alleviating conditions: eating and drinking make it worse Associated signs/symptoms: back pain, upper abdominal tingling, nausea with episodes of pain Context: [name] has had intermittent pelvic pain and pressure throughout her pregnancy. She reports that this episode of pain started yesterday and the pain has been coming and going since. It is mostly in her low belly and back. Eating and drinking seem to make it worse. She is taking Pepcid for heartburn and this pain doesn't feel like heartburn to her at all. She has taken Tylenol, used a heating pad, and taken a warm bath or shower, none of which seem to give her relief. She reports no dysuria, no constipation.   S: Resting comfortably.   She reports:  -active fetal movement -no leakage of fluid -no vaginal bleeding -no contractions  Maternal Medical History:   Past Medical History:  Diagnosis Date  . Anemia   . Anxiety   . Bladder infection, acute 01-10-2015   completed antibiotics and denies any s&s of infection (01-30-15)  . Dysrhythmia    occ irregular heart beat per pt  . GERD (gastroesophageal reflux disease)    NO MEDS  . Headache    MIGRAINES DAILY    Past Surgical History:  Procedure Laterality Date  . ADENOIDECTOMY    . CHOLECYSTECTOMY N/A 02/01/2015   Procedure: LAPAROSCOPIC CHOLECYSTECTOMY WITH INTRAOPERATIVE CHOLANGIOGRAM;  Surgeon: Earline Mayotte, MD;  Location: [facility] ORS;  Service: General;  Laterality: N/A;  . CHOLECYSTECTOMY    . TONSILLECTOMY    . WISDOM TOOTH EXTRACTION      Allergies  Allergen Reactions  . Amoxicillin Anaphylaxis  .  Penicillin G   . Penicillins Anaphylaxis and Swelling    Has patient had a PCN reaction causing immediate rash, facial/tongue/throat swelling, SOB or lightheadedness with hypotension: Yes Has patient had a PCN reaction causing severe rash involving mucus membranes or skin necrosis: No Has patient had a PCN reaction that required hospitalization No Has patient had a PCN reaction occurring within the last 10 years: Yes If all of the above answers are "NO", then [place] proceed with Cephalosporin use.    Prior to Admission medications   Medication Sig Start Date End Date Taking? Authorizing Provider  acetaminophen (TYLENOL) 500 MG tablet Take 2 tablets (1,000 mg total) by mouth every 6 (six) hours as needed for mild pain or moderate pain. 06/19/19  Yes Genia Del, CNM  Doxylamine-Pyridoxine (DICLEGIS) 10-10 MG TBEC Take by mouth.   Yes [provider]  famotidine (PEPCID) 10 MG tablet Take 10 mg by mouth 2 (two) times daily.   Yes [provider]  ferrous sulfate (FER-IN-SOL) 75 (15 Fe) MG/ML SOLN Take by mouth.   Yes [provider]  Prenatal Vit-Fe Fumarate-FA (PRENATAL MULTIVITAMIN) TABS tablet Take 1 tablet by mouth daily at 12 noon.   Yes [provider]     Social History: She  reports that she has quit smoking. Her smoking use included cigarettes. She has a 4.00 pack-year smoking history. She has never used smokeless tobacco. She reports previous alcohol use. She reports that she does not use drugs.  Family History: family history includes Arrhythmia in her father; CAD in her paternal  grandfather and paternal grandmother; Cancer in her maternal aunt; Healthy in her mother; Hyperlipidemia in her father; Hypertension in her father.   Review of Systems: A full review of systems was performed and negative except as noted in the HPI.     O:  BP 100/62 (BP Location: Left Arm)   Pulse (!) 135   Temp (!) 97.4 F (36.3 C) (Oral)   Resp 18   Ht 5\' 1"   (1.549 m)   Wt 77.1 kg   LMP 10/24/2018   SpO2 98%   BMI 32.12 kg/m  No results found for this or any previous visit (from the past 48 hour(s)).   Constitutional: NAD, AAOx3  HE/ENT: extraocular movements grossly intact, moist mucous membranes CV: RRR PULM: normal respiratory effort, CTABL     Abd: gravid, diffuse mild tenderness, non-distended, soft  Back: symmetric, +R CVA tenderness     Ext: Non-tender, Nonedmeatous   Psych: mood appropriate, speech normal Pelvic deferred, cervical exam FTP/thick/posterior per RN at 1212  NST:  Baseline: 130-135 Variability: moderate Accelerations: 15x15 presents Decelerations: variable x1 at 1202 Time: 68mins Toco: uterine irritability   A/P: 24 y.o. [redacted]w[redacted]d here for antenatal surveillance during pregnancy.  Principle diagnosis: abdominal pain during pregnancy  Abdominal pain  Tylenol 1g PO once  CBC, CMP, amylase, lipase, and UA ordered  Fetal Wellbeing  Overall reassuring, continue monitoring   Discussed with Dr. Leafy Ro, who is in agreement with above plan. Will await lab results for further management.   Lisette Grinder 07/22/2019 12:58 PM  ----- Lisette Grinder, CNM Certified Nurse Midwife [facility], Department of [facility]

## 2019-07-22 NOTE — OB Triage Note (Signed)
Pt is a 24 y.o G3P1011 [redacted]w[redacted]d, presents to the ED c/o vaginal pressure and back pain. Pt is denying leaking of fluid or vaginal bleeding, states positive fetal movement. Pt rates back pain 10/10 and right and left epigastric pains each time she eats followed by nausea. External monitors applied and assessing, initial FHR 150. VSS. M.Haviland, CNM made aware of pt.

## 2019-07-24 ENCOUNTER — Other Ambulatory Visit: Payer: Self-pay

## 2019-07-24 ENCOUNTER — Observation Stay
Admission: EM | Admit: 2019-07-24 | Discharge: 2019-07-24 | Disposition: A | Discharge status: Home / Self Care | Payer: Medicaid Other | Attending: Certified Nurse Midwife | Admitting: Certified Nurse Midwife

## 2019-07-24 ENCOUNTER — Encounter: Payer: Self-pay | Admitting: Obstetrics & Gynecology

## 2019-07-24 DIAGNOSIS — Z3A39 39 weeks gestation of pregnancy: Secondary | ICD-10-CM | POA: Insufficient documentation

## 2019-07-24 DIAGNOSIS — Z79899 Other long term (current) drug therapy: Secondary | ICD-10-CM | POA: Diagnosis not present

## 2019-07-24 DIAGNOSIS — Z0371 Encounter for suspected problem with amniotic cavity and membrane ruled out: Secondary | ICD-10-CM | POA: Diagnosis present

## 2019-07-24 DIAGNOSIS — K219 Gastro-esophageal reflux disease without esophagitis: Secondary | ICD-10-CM | POA: Insufficient documentation

## 2019-07-24 DIAGNOSIS — O4703 False labor before 37 completed weeks of gestation, third trimester: Secondary | ICD-10-CM | POA: Diagnosis not present

## 2019-07-24 DIAGNOSIS — Z87892 Personal history of anaphylaxis: Secondary | ICD-10-CM | POA: Insufficient documentation

## 2019-07-24 DIAGNOSIS — Z880 Allergy status to penicillin: Secondary | ICD-10-CM | POA: Diagnosis not present

## 2019-07-24 DIAGNOSIS — O99613 Diseases of the digestive system complicating pregnancy, third trimester: Secondary | ICD-10-CM | POA: Insufficient documentation

## 2019-07-24 LAB — URINE CULTURE

## 2019-07-24 LAB — RUPTURE OF MEMBRANE (ROM)PLUS: Rom Plus: NEGATIVE

## 2019-07-24 MED ORDER — PRENATAL MULTIVITAMIN CH: 1.0000 | ORAL_TABLET | Freq: Every day | ORAL | Status: DC

## 2019-07-24 NOTE — Discharge Summary (Signed)
[name] is a 24 y.o. female. She is at [redacted]w[redacted]d gestation. Patient's last menstrual period was 10/24/2018. Estimated Date of Delivery: 07/31/19  Prenatal care site: [facility]   Chief complaint: leakage of fluid Location: vagina Onset/timing: around 0200 Duration: once Quality: clear fluid Severity: mild to moderate Aggravating or alleviating conditions: none Associated signs/symptoms: contractions Context: [name] reports that she and her husband had intercourse earlier this morning. Afterwards, she noticed a leakage of clear fluid into her vagina. She has not notice anything since. She also reports contractions that started after she had the leakage.   S: Resting comfortably.   She reports:  -active fetal movement -no continued leakage -no vaginal bleeding  Maternal Medical History:   Past Medical History:  Diagnosis Date  . Anemia   . Anxiety   . Bladder infection, acute 01-10-2015   completed antibiotics and denies any s&s of infection (01-30-15)  . Dysrhythmia    occ irregular heart beat per pt  . GERD (gastroesophageal reflux disease)    NO MEDS  . Headache    MIGRAINES DAILY    Past Surgical History:  Procedure Laterality Date  . ADENOIDECTOMY    . CHOLECYSTECTOMY N/A 02/01/2015   Procedure: LAPAROSCOPIC CHOLECYSTECTOMY WITH INTRAOPERATIVE CHOLANGIOGRAM;  Surgeon: Earline Mayotte, MD;  Location: [facility] ORS;  Service: General;  Laterality: N/A;  . CHOLECYSTECTOMY    . TONSILLECTOMY    . WISDOM TOOTH EXTRACTION      Allergies  Allergen Reactions  . Amoxicillin Anaphylaxis  . Penicillin G   . Penicillins Anaphylaxis and Swelling    Has patient had a PCN reaction causing immediate rash, facial/tongue/throat swelling, SOB or lightheadedness with hypotension: Yes Has patient had a PCN reaction causing severe rash involving mucus membranes or skin necrosis: No Has patient had a PCN reaction that required hospitalization No Has patient had a PCN  reaction occurring within the last 10 years: Yes If all of the above answers are "NO", then Anastasia proceed with Cephalosporin use.    Prior to Admission medications   Medication Sig Start Date End Date Taking? Authorizing Provider  acetaminophen (TYLENOL) 500 MG tablet Take 2 tablets (1,000 mg total) by mouth every 6 (six) hours as needed for mild pain or moderate pain. 06/19/19  Yes Genia Del, CNM  Doxylamine-Pyridoxine (DICLEGIS) 10-10 MG TBEC Take by mouth.   Yes [provider]  famotidine (PEPCID) 10 MG tablet Take 10 mg by mouth 2 (two) times daily.   Yes [provider]  ferrous sulfate (FER-IN-SOL) 75 (15 Fe) MG/ML SOLN Take by mouth.   Yes [provider]  Prenatal Vit-Fe Fumarate-FA (PRENATAL MULTIVITAMIN) TABS tablet Take 1 tablet by mouth daily at 12 noon.   Yes [provider]     Social History: She  reports that she has quit smoking. Her smoking use included cigarettes. She has a 4.00 pack-year smoking history. She has never used smokeless tobacco. She reports previous alcohol use. She reports that she does not use drugs.  Family History: family history includes Arrhythmia in her father; CAD in her paternal grandfather and paternal grandmother; Cancer in her maternal aunt; Healthy in her mother; Hyperlipidemia in her father; Hypertension in her father.   Review of Systems: A full review of systems was performed and negative except as noted in the HPI.     O:  BP 120/71 (BP Location: Left Arm)   Pulse (!) 109   Temp 97.6 F (36.4 C) (Oral)  Resp 19   Ht 5\' 1"  (1.549 m)   Wt 77.1 kg   LMP 10/24/2018   BMI 32.12 kg/m  Results for orders placed or performed during the hospital encounter of 07/24/19 (from the past 48 hour(s))  ROM Plus ([facility] only)   Collection Time: 07/24/19  2:04 AM  Result Value Ref Range   Rom Plus NEGATIVE   Results for orders placed or performed during the hospital encounter of 07/22/19 (from the past 48  hour(s))  Comprehensive metabolic panel   Collection Time: 07/22/19  1:13 PM  Result Value Ref Range   Sodium 136 135 - 145 mmol/L   Potassium 3.9 3.5 - 5.1 mmol/L   Chloride 106 98 - 111 mmol/L   CO2 22 22 - 32 mmol/L   Glucose, Bld 91 70 - 99 mg/dL   BUN 8 6 - 20 mg/dL   Creatinine, Ser 09/19/19 0.44 - 1.00 mg/dL   Calcium 8.1 (L) 8.9 - 10.3 mg/dL   Total Protein 6.3 (L) 6.5 - 8.1 g/dL   Albumin 2.8 (L) 3.5 - 5.0 g/dL   AST 20 15 - 41 U/L   ALT 11 0 - 44 U/L   Alkaline Phosphatase 104 38 - 126 U/L   Total Bilirubin 0.3 0.3 - 1.2 mg/dL   GFR calc non Af Amer >60 >60 mL/min   GFR calc Af Amer >60 >60 mL/min   Anion gap 8 5 - 15  CBC on admission   Collection Time: 07/22/19  1:13 PM  Result Value Ref Range   WBC 13.5 (H) 4.0 - 10.5 K/uL   RBC 3.94 3.87 - 5.11 MIL/uL   Hemoglobin 9.9 (L) 12.0 - 15.0 g/dL   HCT 09/19/19 (L) 84.1 - 32.4 %   MCV 83.2 80.0 - 100.0 fL   MCH 25.1 (L) 26.0 - 34.0 pg   MCHC 30.2 30.0 - 36.0 g/dL   RDW 40.1 02.7 - 25.3 %   Platelets 230 150 - 400 K/uL   nRBC 0.0 0.0 - 0.2 %  Amylase   Collection Time: 07/22/19  1:13 PM  Result Value Ref Range   Amylase 85 28 - 100 U/L  Lipase, blood   Collection Time: 07/22/19  1:13 PM  Result Value Ref Range   Lipase 34 11 - 51 U/L  Urinalysis, Complete w Microscopic   Collection Time: 07/22/19  1:19 PM  Result Value Ref Range   Color, Urine STRAW (A) YELLOW   APPearance HAZY (A) CLEAR   Specific Gravity, Urine 1.006 1.005 - 1.030   pH 7.0 5.0 - 8.0   Glucose, UA NEGATIVE NEGATIVE mg/dL   Hgb urine dipstick NEGATIVE NEGATIVE   Bilirubin Urine NEGATIVE NEGATIVE   Ketones, ur NEGATIVE NEGATIVE mg/dL   Protein, ur NEGATIVE NEGATIVE mg/dL   Nitrite NEGATIVE NEGATIVE   Leukocytes,Ua LARGE (A) NEGATIVE   RBC / HPF 0-5 0 - 5 RBC/hpf   WBC, UA 6-10 0 - 5 WBC/hpf   Bacteria, UA RARE (A) NONE SEEN   Squamous Epithelial / LPF 0-5 0 - 5   Mucus PRESENT      Constitutional: NAD, AAOx3  HE/ENT: extraocular movements  grossly intact, moist mucous membranes CV: RRR PULM: normal respiratory effort, CTABL     Abd: gravid, non-tender, non-distended, soft      Ext: Non-tender, Nonedmeatous   Psych: mood appropriate, speech normal Pelvic per RN 0200 SVE: 2/30-40/-3; 04-11-2001 SVE 2-30-40/-3  NST/monitoring:  Baseline: 120 Variability: moderate Accelerations: 15x15 present regularly Decelerations: absent  Time: 1hour 43mins Toco: contractions q2-5 minutes   A/P: 24 y.o. [redacted]w[redacted]d here for antenatal surveillance during pregnancy.  Principle diagnosis: concern for leakage of fluid, no present  Active Labor  No cervical change over 4 hours  Active labor not present  Fetal Wellbeing  Reactive NST, reassuring for GA  Concern for leakage of fluid  ROM Plus negative  No continued leakage since initial event  D/c home stable, precautions reviewed, follow-up as scheduled.   Scheduled for IOL 07/28/2019  Lisette Grinder 07/24/2019 6:18 AM  ----- Lisette Grinder, CNM Certified Nurse Midwife [facility], Department of [facility]

## 2019-07-25 ENCOUNTER — Encounter: Payer: Self-pay | Admitting: Obstetrics & Gynecology

## 2019-07-25 ENCOUNTER — Observation Stay
Admission: EM | Admit: 2019-07-25 | Discharge: 2019-07-25 | Disposition: A | Discharge status: Home / Self Care | Payer: Medicaid Other | Attending: Obstetrics & Gynecology | Admitting: Obstetrics & Gynecology

## 2019-07-25 ENCOUNTER — Other Ambulatory Visit: Payer: Self-pay

## 2019-07-25 DIAGNOSIS — Z79899 Other long term (current) drug therapy: Secondary | ICD-10-CM | POA: Diagnosis not present

## 2019-07-25 DIAGNOSIS — O99013 Anemia complicating pregnancy, third trimester: Secondary | ICD-10-CM | POA: Diagnosis not present

## 2019-07-25 DIAGNOSIS — O471 False labor at or after 37 completed weeks of gestation: Secondary | ICD-10-CM | POA: Diagnosis not present

## 2019-07-25 DIAGNOSIS — Z3A39 39 weeks gestation of pregnancy: Secondary | ICD-10-CM | POA: Diagnosis not present

## 2019-07-25 DIAGNOSIS — Z87891 Personal history of nicotine dependence: Secondary | ICD-10-CM | POA: Insufficient documentation

## 2019-07-25 DIAGNOSIS — O99613 Diseases of the digestive system complicating pregnancy, third trimester: Secondary | ICD-10-CM | POA: Insufficient documentation

## 2019-07-25 DIAGNOSIS — K219 Gastro-esophageal reflux disease without esophagitis: Secondary | ICD-10-CM | POA: Diagnosis not present

## 2019-07-25 LAB — URINALYSIS, COMPLETE (UACMP) WITH MICROSCOPIC
Bacteria, UA: NONE SEEN
Bilirubin Urine: NEGATIVE
Glucose, UA: NEGATIVE mg/dL
Hgb urine dipstick: NEGATIVE
Ketones, ur: NEGATIVE mg/dL
Nitrite: NEGATIVE
Protein, ur: NEGATIVE mg/dL
Specific Gravity, Urine: 1.009 (ref 1.005–1.030)
pH: 7 (ref 5.0–8.0)

## 2019-07-25 NOTE — OB Triage Note (Addendum)
Pt 24 year old G3P1011 39 weeks and 1 day, presented to OBS3 from office for a non-reactive NST. Pt denied ctx, leakage of fluid and denied vaginal bleeding. Pt states positive fetal movement. Pt c/o 10/10 continuous back pain not relived by anything per pt. Monitors applied for NST, fht 120BPM. Pt denies n/v/d. Pt VS WDL. Pt given PO hydration. Will continue to monitor.

## 2019-07-25 NOTE — Progress Notes (Signed)
Discharge instructions, education, and appointments given and explained. Pt verbalized understanding with no further questions. Pt ambulated to personal vehicle for discharge with mother.

## 2019-07-25 NOTE — OB Triage Provider Note (Signed)
Triage Visit with NST    [name] is a 24 y.o. [id number]. She is at [redacted]w[redacted]d gestation. Patient's last menstrual period was 10/24/2018. Estimated Date of Delivery: 07/31/19 Prenatal care site: [facility]   Chief Complaint: Pt sent from the office for inconclusive NST and further monitoring.  Pt was seen in the office for UCs and lots of pain in her back -Was seen twice this past weekend for labor checks and sent home. -Denies dysuria - Wet prep neg - SVE in the office 2/75/-3 med, post  Subjective:  Pt is very uncomfortable with UCs Pt is also very anxious and feels she needs her mother with her while she is there Denies VB, or LOF   Active fetal movement.    Past Medical History:  Diagnosis Date  . Anemia   . Anxiety   . Bladder infection, acute 01-10-2015   completed antibiotics and denies any s&s of infection (01-30-15)  . Dysrhythmia    occ irregular heart beat per pt  . GERD (gastroesophageal reflux disease)    NO MEDS  . Headache    MIGRAINES DAILY    Past Surgical History:  Procedure Laterality Date  . ADENOIDECTOMY    . CHOLECYSTECTOMY N/A 02/01/2015   Procedure: LAPAROSCOPIC CHOLECYSTECTOMY WITH INTRAOPERATIVE CHOLANGIOGRAM;  Surgeon: Robert Bellow, MD;  Location: [facility] ORS;  Service: General;  Laterality: N/A;  . CHOLECYSTECTOMY    . TONSILLECTOMY    . WISDOM TOOTH EXTRACTION      Allergies  Allergen Reactions  . Amoxicillin Anaphylaxis  . Penicillin G   . Penicillins Anaphylaxis and Swelling    Has patient had a PCN reaction causing immediate rash, facial/tongue/throat swelling, SOB or lightheadedness with hypotension: Yes Has patient had a PCN reaction causing severe rash involving mucus membranes or skin necrosis: No Has patient had a PCN reaction that required hospitalization No Has patient had a PCN reaction occurring within the last 10 years: Yes If all of the above answers are "NO", then Kellen proceed with Cephalosporin use.    Prior  to Admission medications   Medication Sig Start Date End Date Taking? Authorizing Provider  acetaminophen (TYLENOL) 500 MG tablet Take 2 tablets (1,000 mg total) by mouth every 6 (six) hours as needed for mild pain or moderate pain. 06/19/19  Yes Lisette Grinder, CNM  famotidine (PEPCID) 10 MG tablet Take 10 mg by mouth 2 (two) times daily.   Yes [provider]  ferrous sulfate (FER-IN-SOL) 75 (15 Fe) MG/ML SOLN Take by mouth.   Yes [provider]  Prenatal Vit-Fe Fumarate-FA (PRENATAL MULTIVITAMIN) TABS tablet Take 1 tablet by mouth daily at 12 noon.   Yes [provider]  Doxylamine-Pyridoxine (DICLEGIS) 10-10 MG TBEC Take by mouth.    [provider]     Social History:  reports that she has quit smoking. Her smoking use included cigarettes. She has a 4.00 pack-year smoking history. She has never used smokeless tobacco. She reports previous alcohol use. She reports that she does not use drugs.  Family History: family history includes Arrhythmia in her father; CAD in her paternal grandfather and paternal grandmother; Cancer in her maternal aunt; Healthy in her mother; Hyperlipidemia in her father; Hypertension in her father.   Review of Systems: A full review of systems was performed and negative except as noted in the HPI.    Objective:  BP 120/72 (BP Location: Left Arm)   Pulse 86   Temp 98.2 F (36.8 C) (Oral)  Resp 16   Ht 5' (1.524 m)   Wt 76.2 kg   LMP 10/24/2018   BMI 32.81 kg/m     Results for orders placed or performed during the hospital encounter of 07/25/19 (from the past 24 hour(s))  Urinalysis, Complete w Microscopic     Status: Abnormal   Collection Time: 07/25/19 12:47 PM  Result Value Ref Range   Color, Urine STRAW (A) YELLOW   APPearance CLEAR (A) CLEAR   Specific Gravity, Urine 1.009 1.005 - 1.030   pH 7.0 5.0 - 8.0   Glucose, UA NEGATIVE NEGATIVE mg/dL   Hgb urine dipstick NEGATIVE NEGATIVE   Bilirubin Urine  NEGATIVE NEGATIVE   Ketones, ur NEGATIVE NEGATIVE mg/dL   Protein, ur NEGATIVE NEGATIVE mg/dL   Nitrite NEGATIVE NEGATIVE   Leukocytes,Ua TRACE (A) NEGATIVE   RBC / HPF 0-5 0 - 5 RBC/hpf   WBC, UA 0-5 0 - 5 WBC/hpf   Bacteria, UA NONE SEEN NONE SEEN   Squamous Epithelial / LPF 0-5 0 - 5     Gen: NAD, AAOx3      Abd: FNTTP      Ext: Non-tender, Nonedmeatous      NST: FHR baseline: 125 bpm Variability: moderate Accelerations: yes Decelerations: none Time: 20 minutes Category/reactivity: reactive   TOCO: UCs difficult to pick up SVE: deferred done in the office   Assessment:   24 y.o. [id number] [redacted]w[redacted]d sent from the office for inconclusive NST and UCs  Principle Diagnosis:  Inconclusive earlier NST  Plan:   Labor: not present.   Fetal Wellbeing: NST is reassuring, reactive tracing   D/c home stable, precautions reviewed, follow-up as scheduled.     - Kick counts reviewed with the patient in detail.  Patient instructed to assess for fetal activity daily at regular intervals. Counts to be done if decreased activity noted. Patient instructed to contact the office if counts do not reveal adequate movements.  - Preterm labor precautions (ROM, vaginal bleeding, contractions/lower back pain, change in discharge), along with how and when to call the office, reviewed. Patient verbalized understanding.  Haroldine Laws, CNM 07/25/2019 2:12 PM

## 2019-07-26 ENCOUNTER — Other Ambulatory Visit
Admission: RE | Admit: 2019-07-26 | Discharge: 2019-07-26 | Disposition: A | Discharge status: Home / Self Care | Payer: Medicaid Other | Source: Ambulatory Visit

## 2019-07-26 DIAGNOSIS — Z01812 Encounter for preprocedural laboratory examination: Secondary | ICD-10-CM | POA: Insufficient documentation

## 2019-07-26 DIAGNOSIS — Z20822 Contact with and (suspected) exposure to covid-19: Secondary | ICD-10-CM | POA: Insufficient documentation

## 2019-07-26 LAB — SARS CORONAVIRUS 2 (TAT 6-24 HRS): SARS Coronavirus 2: NEGATIVE

## 2019-07-27 LAB — URINE CULTURE

## 2019-07-28 ENCOUNTER — Inpatient Hospital Stay
Admission: RE | Admit: 2019-07-28 | Discharge: 2019-07-29 | DRG: 806 | Disposition: A | Discharge status: Home / Self Care | Payer: Medicaid Other

## 2019-07-28 ENCOUNTER — Encounter: Payer: Self-pay | Admitting: Obstetrics and Gynecology

## 2019-07-28 ENCOUNTER — Other Ambulatory Visit: Payer: Self-pay

## 2019-07-28 ENCOUNTER — Inpatient Hospital Stay: Payer: Medicaid Other | Admitting: Certified Registered Nurse Anesthetist

## 2019-07-28 DIAGNOSIS — O26892 Other specified pregnancy related conditions, second trimester: Secondary | ICD-10-CM

## 2019-07-28 DIAGNOSIS — Z3A39 39 weeks gestation of pregnancy: Secondary | ICD-10-CM

## 2019-07-28 DIAGNOSIS — D649 Anemia, unspecified: Secondary | ICD-10-CM | POA: Diagnosis present

## 2019-07-28 DIAGNOSIS — R102 Pelvic and perineal pain: Secondary | ICD-10-CM

## 2019-07-28 DIAGNOSIS — Z880 Allergy status to penicillin: Secondary | ICD-10-CM

## 2019-07-28 DIAGNOSIS — B019 Varicella without complication: Secondary | ICD-10-CM | POA: Diagnosis present

## 2019-07-28 DIAGNOSIS — O26893 Other specified pregnancy related conditions, third trimester: Principal | ICD-10-CM | POA: Diagnosis present

## 2019-07-28 DIAGNOSIS — O9902 Anemia complicating childbirth: Secondary | ICD-10-CM | POA: Diagnosis present

## 2019-07-28 DIAGNOSIS — O9852 Other viral diseases complicating childbirth: Secondary | ICD-10-CM | POA: Diagnosis present

## 2019-07-28 DIAGNOSIS — Z3490 Encounter for supervision of normal pregnancy, unspecified, unspecified trimester: Secondary | ICD-10-CM | POA: Diagnosis present

## 2019-07-28 DIAGNOSIS — Z6791 Unspecified blood type, Rh negative: Secondary | ICD-10-CM | POA: Diagnosis not present

## 2019-07-28 DIAGNOSIS — Z87891 Personal history of nicotine dependence: Secondary | ICD-10-CM

## 2019-07-28 DIAGNOSIS — Z20822 Contact with and (suspected) exposure to covid-19: Secondary | ICD-10-CM | POA: Diagnosis present

## 2019-07-28 LAB — CBC
HCT: 33.6 % — ABNORMAL LOW (ref 36.0–46.0)
Hemoglobin: 10.4 g/dL — ABNORMAL LOW (ref 12.0–15.0)
MCH: 25.2 pg — ABNORMAL LOW (ref 26.0–34.0)
MCHC: 31 g/dL (ref 30.0–36.0)
MCV: 81.6 fL (ref 80.0–100.0)
Platelets: 245 10*3/uL (ref 150–400)
RBC: 4.12 MIL/uL (ref 3.87–5.11)
RDW: 14.7 % (ref 11.5–15.5)
WBC: 14.8 10*3/uL — ABNORMAL HIGH (ref 4.0–10.5)
nRBC: 0 % (ref 0.0–0.2)

## 2019-07-28 LAB — TYPE AND SCREEN
ABO/RH(D): B NEG
Antibody Screen: NEGATIVE

## 2019-07-28 MED ORDER — BENZOCAINE-MENTHOL 20-0.5 % EX AERO
1.0000 "application " | INHALATION_SPRAY | CUTANEOUS | Status: DC | PRN
  Administered 2019-07-29: 1 via TOPICAL
  Filled 2019-07-28: qty 56

## 2019-07-28 MED ORDER — FENTANYL 2.5 MCG/ML W/ROPIVACAINE 0.15% IN NS 100 ML EPIDURAL (ARMC)
EPIDURAL | Status: DC | PRN
  Administered 2019-07-28: 12 mL/h via EPIDURAL

## 2019-07-28 MED ORDER — MISOPROSTOL 200 MCG PO TABS
ORAL_TABLET | ORAL | Status: AC
  Filled 2019-07-28: qty 4

## 2019-07-28 MED ORDER — MISOPROSTOL 25 MCG QUARTER TABLET: 25.0000 ug | ORAL_TABLET | ORAL | Status: DC | PRN

## 2019-07-28 MED ORDER — ONDANSETRON HCL 4 MG/2ML IJ SOLN: 4.0000 mg | Freq: Four times a day (QID) | INTRAMUSCULAR | Status: DC | PRN

## 2019-07-28 MED ORDER — LIDOCAINE-EPINEPHRINE (PF) 1.5 %-1:200000 IJ SOLN
INTRAMUSCULAR | Status: DC | PRN
  Administered 2019-07-28: 3 mL via PERINEURAL

## 2019-07-28 MED ORDER — ONDANSETRON HCL 4 MG/2ML IJ SOLN: 4.0000 mg | INTRAMUSCULAR | Status: DC | PRN

## 2019-07-28 MED ORDER — IBUPROFEN 600 MG PO TABS
600.0000 mg | ORAL_TABLET | Freq: Four times a day (QID) | ORAL | Status: DC
  Administered 2019-07-28 – 2019-07-29 (×2): 600 mg via ORAL
  Filled 2019-07-28 (×2): qty 1

## 2019-07-28 MED ORDER — BUTORPHANOL TARTRATE 1 MG/ML IJ SOLN: 1.0000 mg | INTRAMUSCULAR | Status: DC | PRN

## 2019-07-28 MED ORDER — LACTATED RINGERS IV SOLN: INTRAVENOUS | Status: DC

## 2019-07-28 MED ORDER — COCONUT OIL OIL
1.0000 "application " | TOPICAL_OIL | Status: DC | PRN
  Filled 2019-07-28: qty 120

## 2019-07-28 MED ORDER — SIMETHICONE 80 MG PO CHEW: 80.0000 mg | CHEWABLE_TABLET | ORAL | Status: DC | PRN

## 2019-07-28 MED ORDER — ACETAMINOPHEN 500 MG PO TABS
1000.0000 mg | ORAL_TABLET | Freq: Four times a day (QID) | ORAL | Status: DC | PRN
  Administered 2019-07-28 – 2019-07-29 (×4): 1000 mg via ORAL
  Filled 2019-07-28 (×6): qty 2

## 2019-07-28 MED ORDER — ACETAMINOPHEN 500 MG PO TABS
1000.0000 mg | ORAL_TABLET | Freq: Four times a day (QID) | ORAL | Status: DC | PRN
  Administered 2019-07-28: 14:00:00 1000 mg via ORAL
  Filled 2019-07-28 (×2): qty 2

## 2019-07-28 MED ORDER — BUPIVACAINE HCL (PF) 0.25 % IJ SOLN
INTRAMUSCULAR | Status: DC | PRN
  Administered 2019-07-28 (×2): 3 mL via EPIDURAL

## 2019-07-28 MED ORDER — LACTATED RINGERS IV SOLN: 500.0000 mL | INTRAVENOUS | Status: DC | PRN

## 2019-07-28 MED ORDER — SOD CITRATE-CITRIC ACID 500-334 MG/5ML PO SOLN: 30.0000 mL | ORAL | Status: DC | PRN

## 2019-07-28 MED ORDER — LIDOCAINE HCL (PF) 1 % IJ SOLN
30.0000 mL | INTRAMUSCULAR | Status: AC | PRN
  Administered 2019-07-28: 14:00:00 2 mL via SUBCUTANEOUS
  Filled 2019-07-28: qty 30

## 2019-07-28 MED ORDER — WITCH HAZEL-GLYCERIN EX PADS
1.0000 "application " | MEDICATED_PAD | CUTANEOUS | Status: DC
  Administered 2019-07-29: 1 via TOPICAL
  Filled 2019-07-28: qty 100

## 2019-07-28 MED ORDER — PRENATAL MULTIVITAMIN CH
1.0000 | ORAL_TABLET | Freq: Every day | ORAL | Status: DC
  Administered 2019-07-29: 13:00:00 1 via ORAL
  Filled 2019-07-28: qty 1

## 2019-07-28 MED ORDER — DIPHENHYDRAMINE HCL 25 MG PO CAPS: 25.0000 mg | ORAL_CAPSULE | Freq: Four times a day (QID) | ORAL | Status: DC | PRN

## 2019-07-28 MED ORDER — DIBUCAINE (PERIANAL) 1 % EX OINT: 1.0000 "application " | TOPICAL_OINTMENT | CUTANEOUS | Status: DC | PRN

## 2019-07-28 MED ORDER — DOCUSATE SODIUM 100 MG PO CAPS
100.0000 mg | ORAL_CAPSULE | Freq: Two times a day (BID) | ORAL | Status: DC
  Filled 2019-07-28 (×2): qty 1

## 2019-07-28 MED ORDER — OXYTOCIN 40 UNITS IN NORMAL SALINE INFUSION - SIMPLE MED
1.0000 m[IU]/min | INTRAVENOUS | Status: DC
  Administered 2019-07-28: 12:00:00 2 m[IU]/min via INTRAVENOUS
  Filled 2019-07-28: qty 1000

## 2019-07-28 MED ORDER — ONDANSETRON HCL 4 MG PO TABS: 4.0000 mg | ORAL_TABLET | ORAL | Status: DC | PRN

## 2019-07-28 MED ORDER — FENTANYL 2.5 MCG/ML W/ROPIVACAINE 0.15% IN NS 100 ML EPIDURAL (ARMC)
EPIDURAL | Status: AC
  Filled 2019-07-28: qty 100

## 2019-07-28 MED ORDER — MEASLES, MUMPS & RUBELLA VAC IJ SOLR
0.5000 mL | Freq: Once | INTRAMUSCULAR | Status: DC
  Filled 2019-07-28: qty 0.5

## 2019-07-28 MED ORDER — OXYTOCIN BOLUS FROM INFUSION
500.0000 mL | Freq: Once | INTRAVENOUS | Status: AC
  Administered 2019-07-28: 18:00:00 500 mL via INTRAVENOUS

## 2019-07-28 MED ORDER — VARICELLA VIRUS VACCINE LIVE 1350 PFU/0.5ML IJ SUSR
0.5000 mL | INTRAMUSCULAR | Status: DC | PRN
  Filled 2019-07-28: qty 0.5

## 2019-07-28 MED ORDER — TERBUTALINE SULFATE 1 MG/ML IJ SOLN: 0.2500 mg | Freq: Once | INTRAMUSCULAR | Status: DC | PRN

## 2019-07-28 MED ORDER — OXYTOCIN 40 UNITS IN NORMAL SALINE INFUSION - SIMPLE MED: 2.5000 [IU]/h | INTRAVENOUS | Status: DC

## 2019-07-28 MED ORDER — FAMOTIDINE 20 MG PO TABS
10.0000 mg | ORAL_TABLET | Freq: Two times a day (BID) | ORAL | Status: DC
  Administered 2019-07-29: 09:00:00 10 mg via ORAL
  Filled 2019-07-28 (×2): qty 1

## 2019-07-28 NOTE — Anesthesia Procedure Notes (Signed)
Epidural Patient location during procedure: OB Start time: 07/28/2019 1:53 PM End time: 07/28/2019 2:06 PM  Staffing Anesthesiologist: Naomie Dean, MD Resident/CRNA: Rosanne Gutting, CRNA Performed: resident/CRNA   Preanesthetic Checklist Completed: patient identified, IV checked, site marked, risks and benefits discussed, surgical consent, monitors and equipment checked, pre-op evaluation and timeout performed  Epidural Patient position: sitting Prep: ChloraPrep Patient monitoring: heart rate, continuous pulse ox and blood pressure Approach: midline Location: L3-L4 Injection technique: LOR saline  Needle:  Needle type: Tuohy  Needle gauge: 17 G Needle length: 9 cm and 9 Catheter type: closed end flexible Catheter size: 19 Gauge Test dose: negative and 1.5% lidocaine with Epi 1:200 K  Assessment Sensory level: T10 Events: blood not aspirated, injection not painful, no injection resistance, no paresthesia and negative IV test  Additional Notes 1 attempt Pt. Evaluated and documentation done after procedure finished. Patient identified. Risks/Benefits/Options discussed with patient including but not limited to bleeding, infection, nerve damage, paralysis, failed block, incomplete pain control, headache, blood pressure changes, nausea, vomiting, reactions to medication both or allergic, itching and postpartum back pain. Confirmed with bedside nurse the patient's most recent platelet count. Confirmed with patient that they are not currently taking any anticoagulation, have any bleeding history or any family history of bleeding disorders. Patient expressed understanding and wished to proceed. All questions were answered. Sterile technique was used throughout the entire procedure. Please see nursing notes for vital signs. Test dose was given through epidural catheter and negative prior to continuing to dose epidural or start infusion. Warning signs of high block given to the  patient including shortness of breath, tingling/numbness in hands, complete motor block, or any concerning symptoms with instructions to call for help. Patient was given instructions on fall risk and not to get out of bed. All questions and concerns addressed with instructions to call with any issues or inadequate analgesia.   Patient tolerated the insertion well without immediate complications.Reason for block:procedure for pain

## 2019-07-28 NOTE — Progress Notes (Signed)
Labor Progress Note  [name] is a 24 y.o. G3P1011 at [redacted]w[redacted]d by LMP admitted for induction of labor due to Elective at term.  Subjective: Feeling mild contractions, would like epidural after AROM   Objective: BP 113/75 (BP Location: Right Arm)   Pulse 86   Temp 97.7 F (36.5 C) (Oral)   Resp 16   Ht 5' (1.524 m)   Wt 77.1 kg   LMP 10/24/2018   SpO2 99%   BMI 33.20 kg/m  Notable VS details: reviewed   Fetal Assessment: FHT:  FHR: 130 bpm, variability: moderate,  accelerations:  Present,  decelerations:  Absent Category/reactivity:  Category I UC:   irregular, mild SVE:  4-5/60/-1 Membrane status: AROM at 1332 Amniotic color: Clear   Labs: Lab Results  Component Value Date   WBC 14.8 (H) 07/28/2019   HGB 10.4 (L) 07/28/2019   HCT 33.6 (L) 07/28/2019   MCV 81.6 07/28/2019   PLT 245 07/28/2019    Assessment / Plan: Induction of labor due to elective at term,  progressing well on pitocin. Dr. Feliberto Gottron updated on plan of care.   Labor: progressing on oxytocin, s/p AROM  Preeclampsia:  no s/s Fetal Wellbeing:  Category I Pain Control:  Labor support without medications and anesthesia called for epidural I/D:  n/a  Gustavo Lah, CNM 07/28/2019, 1:37 PM

## 2019-07-28 NOTE — Anesthesia Preprocedure Evaluation (Signed)
Anesthesia Evaluation  Patient identified by MRN, date of birth, ID band Patient awake    Reviewed: Allergy & Precautions, H&P , NPO status , Patient's Chart, lab work & pertinent test results  Airway Mallampati: II   Neck ROM: full    Dental no notable dental hx.    Pulmonary former smoker,    Pulmonary exam normal        Cardiovascular Normal cardiovascular exam+ dysrhythmias   "Tachycardia occasionally"   Neuro/Psych  Headaches, Anxiety    GI/Hepatic GERD  ,  Endo/Other    Renal/GU      Musculoskeletal   Abdominal   Peds  Hematology  (+) Blood dyscrasia, anemia ,   Anesthesia Other Findings   Reproductive/Obstetrics (+) Pregnancy                             Anesthesia Physical Anesthesia Plan  ASA: II  Anesthesia Plan: Epidural   Post-op Pain Management:    Induction:   PONV Risk Score and Plan:   Airway Management Planned:   Additional Equipment:   Intra-op Plan:   Post-operative Plan:   Informed Consent: I have reviewed the patients History and Physical, chart, labs and discussed the procedure including the risks, benefits and alternatives for the proposed anesthesia with the patient or authorized representative who has indicated his/her understanding and acceptance.       Plan Discussed with: CRNA and Anesthesiologist  Anesthesia Plan Comments:         Anesthesia Quick Evaluation

## 2019-07-28 NOTE — Progress Notes (Signed)
Labor Progress Note  [name] is a 24 y.o. G3P1011 at [redacted]w[redacted]d by LMP admitted for induction of labor due to elective at term  Subjective: comfortable after epidural  Objective: BP 105/71   Pulse 75   Temp 97.7 F (36.5 C) (Oral)   Resp 16   Ht 5' (1.524 m)   Wt 77.1 kg   LMP 10/24/2018   SpO2 99%   BMI 33.20 kg/m  Notable VS details: reviewed   Fetal Assessment: FHT:  FHR: 130 bpm, variability: moderate,  accelerations:  Present,  decelerations:  Present intermittent variables  Category/reactivity:  Category II UC:   IUPC placed d/t difficulty monitoring contractions with external Toco SVE:   5/60/-1/soft Membrane status: AROM  Amniotic color: Clear   Labs: Lab Results  Component Value Date   WBC 14.8 (H) 07/28/2019   HGB 10.4 (L) 07/28/2019   HCT 33.6 (L) 07/28/2019   MCV 81.6 07/28/2019   PLT 245 07/28/2019    Assessment / Plan: Induction of labor due to elective at term,  progressing well on pitocin  Labor: progressing on pitocin, IUPC placed to better monitor contractions Preeclampsia:  no s/s Fetal Wellbeing:  Category II Pain Control:  Epidural I/D:  n/a  Gustavo Lah, CNM 07/28/2019, 3:04 PM

## 2019-07-28 NOTE — H&P (Signed)
OB History & Physical   History of Present Illness:  Chief Complaint: presents for scheduled elective induction of labor at term  HPI:  [name] is a 24 y.o. G27P1011 female at [redacted]w[redacted]d dated by LMP, c/w early [place].  She presents to L&D for scheduled elective induction at term  Reports irregular contractions that have been on and off for the last couple of months, denies LOF or vaginal bleeding, endorses good fetal movement.   Pregnancy Issues: 1. Varicella equivocal 2. Rh negative - rhogam given at 28 weeks  3. Anxiety   Patient Active Problem List   Diagnosis Date Noted  . Encounter for elective induction of labor 07/28/2019  . No leakage of amniotic fluid into vagina 07/24/2019  . Abdominal pain affecting pregnancy 07/22/2019  . Back pain complicating pregnancy 06/19/2019  . Pelvic pain in pregnancy May 25, 2019  . Pelvic pain affecting pregnancy in second trimester, antepartum 04/21/2019  . Indication for care in labor and delivery, antepartum 04/21/2019  . Palpitations 06/07/2018  . Atypical chest pain 06/07/2018  . Anxiety 06/07/2018  . Syncope 04/14/2018  . Periumbilical abdominal pain 03/12/2015  . Gallstones 01/29/2015     Maternal Medical History:   Past Medical History:  Diagnosis Date  . Anemia   . Anxiety   . Bladder infection, acute 01-10-2015   completed antibiotics and denies any s&s of infection (01-30-15)  . Dysrhythmia    occ irregular heart beat per pt  . GERD (gastroesophageal reflux disease)    NO MEDS  . Headache    MIGRAINES DAILY    Past Surgical History:  Procedure Laterality Date  . ADENOIDECTOMY    . CHOLECYSTECTOMY N/A 02/01/2015   Procedure: LAPAROSCOPIC CHOLECYSTECTOMY WITH INTRAOPERATIVE CHOLANGIOGRAM;  Surgeon: Earline Mayotte, MD;  Location: [facility] ORS;  Service: General;  Laterality: N/A;  . CHOLECYSTECTOMY    . TONSILLECTOMY    . WISDOM TOOTH EXTRACTION      Allergies  Allergen Reactions  . Amoxicillin Anaphylaxis  .  Penicillin G   . Penicillins Anaphylaxis and Swelling    Has patient had a PCN reaction causing immediate rash, facial/tongue/throat swelling, SOB or lightheadedness with hypotension: Yes Has patient had a PCN reaction causing severe rash involving mucus membranes or skin necrosis: No Has patient had a PCN reaction that required hospitalization No Has patient had a PCN reaction occurring within the last 10 years: Yes If all of the above answers are "NO", then [place] proceed with Cephalosporin use.    Prior to Admission medications   Medication Sig Start Date End Date Taking? Authorizing Provider  acetaminophen (TYLENOL) 500 MG tablet Take 2 tablets (1,000 mg total) by mouth every 6 (six) hours as needed for mild pain or moderate pain. 06/19/19  Yes Genia Del, CNM  Doxylamine-Pyridoxine (DICLEGIS) 10-10 MG TBEC Take by mouth.   Yes [provider]  famotidine (PEPCID) 10 MG tablet Take 10 mg by mouth 2 (two) times daily.   Yes [provider]  ferrous sulfate (FER-IN-SOL) 75 (15 Fe) MG/ML SOLN Take by mouth.   Yes [provider]  Prenatal Vit-Fe Fumarate-FA (PRENATAL MULTIVITAMIN) TABS tablet Take 1 tablet by mouth daily at 12 noon.   Yes [provider]     Prenatal care site:  [facility]   Social History: She  reports that she has quit smoking. Her smoking use included cigarettes. She has a 4.00 pack-year smoking history. She has never used smokeless tobacco. She reports previous alcohol use. She  reports that she does not use drugs.  Family History: family history includes Arrhythmia in her father; CAD in her paternal grandfather and paternal grandmother; Cancer in her maternal aunt; Healthy in her mother; Hyperlipidemia in her father; Hypertension in her father.   Review of Systems: A full review of systems was performed and negative except as noted in the HPI.     Physical Exam:  Vital Signs: BP 113/75 (BP Location: Right Arm)    Pulse 86   Temp 97.7 F (36.5 C) (Oral)   Resp 16   Ht 5' (1.524 m)   Wt 77.1 kg   LMP 10/24/2018   SpO2 99%   BMI 33.20 kg/m   General: no acute distress.  HEENT: normocephalic, atraumatic Heart: regular rate & rhythm.  No murmurs/rubs/gallops Lungs: clear to auscultation bilaterally, normal respiratory effort Abdomen: soft, gravid, non-tender;  EFW: 7 1/2 lbs  Pelvic:   External: Normal external female genitalia  Cervix: Dilation: 4 / Effacement (%): 60 / Station: -1    Extremities: non-tender, symmetric, no edema bilaterally.  DTRs: 2+/2+  Neurologic: Alert & oriented x 3.    Results for orders placed or performed during the hospital encounter of 07/28/19 (from the past 24 hour(s))  Type and screen     Status: None   Collection Time: 07/28/19 11:08 AM  Result Value Ref Range   ABO/RH(D) B NEG    Antibody Screen NEG    Sample Expiration      07/31/2019,2359 Performed at [facility], [address] [place], [place] [address]   CBC     Status: Abnormal   Collection Time: 07/28/19 11:30 AM  Result Value Ref Range   WBC 14.8 (H) 4.0 - 10.5 K/uL   RBC 4.12 3.87 - 5.11 MIL/uL   Hemoglobin 10.4 (L) 12.0 - 15.0 g/dL   HCT 76.1 (L) 60.7 - 37.1 %   MCV 81.6 80.0 - 100.0 fL   MCH 25.2 (L) 26.0 - 34.0 pg   MCHC 31.0 30.0 - 36.0 g/dL   RDW 06.2 69.4 - 85.4 %   Platelets 245 150 - 400 K/uL   nRBC 0.0 0.0 - 0.2 %    Pertinent Results:  Prenatal Labs: Blood type/Rh B neg   Antibody screen neg  Rubella Immune  Varicella Equivocal   RPR NR  HBsAg Neg  HIV NR  GC neg  Chlamydia neg  Genetic screening negative  1 hour GTT 127  3 hour GTT N/A  GBS Neg    FHT: 130 bpm, moderate variability, accel 15x15 present, decels absent TOCO: quiet SVE:  Dilation: 4 / Effacement (%): 60 / Station: -1    Cephalic by leopolds and SVE   Assessment:  [name] is a 24 y.o. G31P1011 female at [redacted]w[redacted]d here for scheduled elective induction of labor at term.   Plan:  1.  Admit to Labor & Delivery; consents reviewed and obtained  2. Fetal Well being  - Fetal Tracing: cat 1 - Group B Streptococcus ppx indicated: Not indicated, GBS neg - Presentation: cephalic confirmed by Leopolds   3. Routine OB: - Prenatal labs reviewed, as above - Rh neg - CBC, T&S, RPR on admit - Clear fluids, IVF  4. Monitoring of Induction of Labor -  Contractions monitored with external toco in place -  Pelvis adequate for labor  -  Plan for induction with oxytocin  -  Plan for continuous fetal monitoring  -  Maternal pain control as desired -  Anticipate vaginal delivery  5. Post Partum Planning: - Infant feeding: breast - Contraception: Previously desired BTL but has changed her mind, new method TBD  Minda Meo, CNM 07/28/19 1:44 PM  __________ Drinda Butts, CNM Certified Nurse Midwife [facility]

## 2019-07-28 NOTE — Discharge Summary (Signed)
Obstetrical Discharge Summary  Patient Name: [name] DOB: [birth year] MRN: [id number]  Date of Admission: 07/28/2019 Date of Delivery: 07/28/2019 Delivered by: Drinda Butts, CNM, Dr. Ouida Sills present for delivery  Date of Discharge: 07/29/2019  Primary OB: [facility]  KVQ:[id number]'V last menstrual period was 10/24/2018. EDC Estimated Date of Delivery: 07/31/19 Gestational Age at Delivery: [redacted]w[redacted]d   Antepartum complications:  1. Varicella equivocal - need Varivax postpartum 2. Rh negative - Rhogam given at 28 weeks  3. Anxiety  Admitting Diagnosis: Encounter for elective IOL at term  Secondary Diagnosis: Patient Active Problem List   Diagnosis Date Noted  . Encounter for elective induction of labor 07/28/2019  . Back pain complicating pregnancy 56/43/3295  . Pelvic pain in pregnancy 2019/05/29  . Palpitations 06/07/2018  . Atypical chest pain 06/07/2018  . Anxiety 06/07/2018  . Syncope 04/14/2018  . Periumbilical abdominal pain 03/12/2015  . Gallstones 01/29/2015   Induction: AROM and Pitocin Complications: None Intrapartum complications/course: Presented to L&D for elective IOL at term.  Comfortable following epidural. Progressed well on pitocin and AROM to c/c/+2.  Good maternal effort with pushing leading to SVD of viable baby boy.  Delivery Type: spontaneous vaginal delivery Anesthesia: epidural Placenta: spontaneous Laceration: 1st degree vaginal laceration. Repair with 3-0 vicryl SH x 1.  Episiotomy: none Newborn Data: Live born female "[name]" Birth Weight: 7 lb 13.9 oz (3570 g) APGAR: 8, 9  Newborn Delivery   Birth date/time: 07/28/2019 18:08:00 Delivery type: Vaginal, Spontaneous    Postpartum Procedures: none  Post partum course:  Patient had an uncomplicated postpartum course.  By time of discharge on PPD#1, her pain was controlled on oral pain medications; she had appropriate lochia and was ambulating, voiding without difficulty and  tolerating regular diet.  She was deemed stable for discharge to home.    Discharge Physical Exam:  BP 111/80   Pulse (!) 56   Temp 98.1 F (36.7 C) (Oral)   Resp 20   Ht 5' (1.524 m)   Wt 77.1 kg   LMP 10/24/2018   SpO2 100% Comment: RA  Breastfeeding Unknown   BMI 33.20 kg/m   General: NAD CV: RRR Pulm: CTABL, nl effort ABD: s/nd/nt, fundus firm and below the umbilicus Lochia: moderate DVT Evaluation: LE non-ttp, no evidence of DVT on exam.  Hemoglobin  Date Value Ref Range Status  07/29/2019 10.0 (L) 12.0 - 15.0 g/dL Final   HGB  Date Value Ref Range Status  09/25/2014 12.2 12.0 - 16.0 g/dL Final   HCT  Date Value Ref Range Status  07/29/2019 33.1 (L) 36.0 - 46.0 % Final  09/27/2014 32.2 (L) 35.0 - 47.0 % Final   Disposition: stable, discharge to home. Baby Feeding: breastmilk Baby Disposition: home with mom  Rh Immune globulin given: Mom Bneg/Baby Bpos Rhogam given Rubella vaccine given: Rubella immune  Tdap vaccine given in AP or PP setting: given 05/10/19 Flu vaccine given in AP or PP setting: declined  Varivax given in PP setting: not given  Contraception: TBD  Prenatal Labs:  Blood type/Rh B neg  Antibody screen neg  Rubella Immune  Varicella Non-Immune  RPR NR  HBsAg Neg  HIV NR  GC neg  Chlamydia neg  Genetic screening negative  1 hour GTT 127  3 hour GTT N/A  GBS Neg     Plan:  Jamillia M Fouche was discharged to home in good condition. Follow-up appointment with delivering provider in 6 weeks.  Discharge Medications: Allergies as of 07/29/2019  Reactions   Amoxicillin Anaphylaxis   Penicillin G    Penicillins Anaphylaxis, Swelling   Has patient had a PCN reaction causing immediate rash, facial/tongue/throat swelling, SOB or lightheadedness with hypotension: Yes Has patient had a PCN reaction causing severe rash involving mucus membranes or skin necrosis: No Has patient had a PCN reaction that required hospitalization No Has  patient had a PCN reaction occurring within the last 10 years: Yes If all of the above answers are "NO", then [place] proceed with Cephalosporin use.      Medication List    TAKE these medications   acetaminophen 500 MG tablet Commonly known as: TYLENOL Take 2 tablets (1,000 mg total) by mouth every 6 (six) hours as needed for mild pain or moderate pain. Notes to patient: Pt had last Tylenol dose at 2022, can have next dose if needed at 2am   Diclegis 10-10 MG Tbec Generic drug: Doxylamine-Pyridoxine Take by mouth.   famotidine 10 MG tablet Commonly known as: PEPCID Take 10 mg by mouth 2 (two) times daily. Notes to patient: Next dose due at bedtime today.   ferrous sulfate 75 (15 Fe) MG/ML Soln Commonly known as: FER-IN-SOL Take by mouth.   prenatal multivitamin Tabs tablet Take 1 tablet by mouth daily at 12 noon. Notes to patient: Next dose due tomorrow (2/19)       Follow-up Information    Gustavo Lah, CNM Follow up in 6 week(s).   Specialty: Certified Nurse Midwife Why: postpartum appointment  Contact information: [address] [place] [address] [phone]           Signed: Cyril Mourning 08/09/19 11:45 AM

## 2019-07-29 LAB — CBC
HCT: 33.1 % — ABNORMAL LOW (ref 36.0–46.0)
Hemoglobin: 10 g/dL — ABNORMAL LOW (ref 12.0–15.0)
MCH: 25 pg — ABNORMAL LOW (ref 26.0–34.0)
MCHC: 30.2 g/dL (ref 30.0–36.0)
MCV: 82.8 fL (ref 80.0–100.0)
Platelets: 234 10*3/uL (ref 150–400)
RBC: 4 MIL/uL (ref 3.87–5.11)
RDW: 14.9 % (ref 11.5–15.5)
WBC: 19.6 10*3/uL — ABNORMAL HIGH (ref 4.0–10.5)
nRBC: 0 % (ref 0.0–0.2)

## 2019-07-29 LAB — RPR: RPR Ser Ql: NONREACTIVE

## 2019-07-29 LAB — FETAL SCREEN: Fetal Screen: NEGATIVE

## 2019-07-29 MED ORDER — RHO D IMMUNE GLOBULIN 1500 UNIT/2ML IJ SOSY
300.0000 ug | PREFILLED_SYRINGE | Freq: Once | INTRAMUSCULAR | Status: AC
  Administered 2019-07-29: 13:00:00 300 ug via INTRAVENOUS
  Filled 2019-07-29: qty 2

## 2019-07-29 MED ORDER — IBUPROFEN 600 MG PO TABS
600.0000 mg | ORAL_TABLET | Freq: Four times a day (QID) | ORAL | Status: DC
  Administered 2019-07-29 (×2): 600 mg via ORAL
  Filled 2019-07-29 (×2): qty 1

## 2019-07-29 NOTE — Discharge Instructions (Signed)
Discharge Instructions:   Follow-up Appointment: Call ASAP and schedule your follow-up appointment with [name] for a visit in 6 weeks!   If there are any new medications, they have been ordered and will be available for pickup at the listed pharmacy on your way home from the hospital.   Call office if you have any of the following: headache, visual changes, fever >101.0 F, chills, shortness of breath, breast concerns, excessive vaginal bleeding, incision drainage or problems, leg pain or redness, depression or any other concerns. If you have vaginal discharge with an odor, let your doctor know.   It is normal to bleed for up to 6 weeks. You should not soak through more than 1 pad in 1 hour. If you have a blood clot larger than your fist with continued bleeding, call your doctor.   Activity: Do not lift > 10 lbs for 6 weeks (do not lift anything heavier than your baby). No intercourse, tampons, swimming pools, hot tubs, baths (only showers) for 6 weeks.  No driving for 1-2 weeks. Continue prenatal vitamin, especially if breastfeeding. Increase calories and fluids (water) while breastfeeding.   Your milk will come in, in the next couple of days (right now it is colostrum). You [name] have a slight fever when your milk comes in, but it should go away on its own.  If it does not, and rises above 101 F please call the doctor. You will also feel achy and your breasts will be firm. They will also start to leak. If you are breastfeeding, continue as you have been and you can pump/express milk for comfort.   If you have too much milk, your breasts can become engorged, which could lead to mastitis. This is an infection of the milk ducts. It can be very painful and you will need to notify your doctor to obtain a prescription for antibiotics. You can also treat it with a shower or hot/cold compress.   For concerns about your baby, please call your pediatrician.  For breastfeeding concerns, the lactation  consultant can be reached at [phone].   Postpartum blues (feelings of happy one minute and sad another minute) are normal for the first few weeks but if it gets worse let your doctor know.   Congratulations! We enjoyed caring for you and your new bundle of joy!      Vaginal Delivery, Care After Refer to this sheet in the next few weeks. These discharge instructions provide you with information on caring for yourself after delivery. Your caregiver [name] also give you specific instructions. Your treatment has been planned according to the most current medical practices available, but problems sometimes occur. Call your caregiver if you have any problems or questions after you go home. HOME CARE INSTRUCTIONS 1. Take over-the-counter or prescription medicines only as directed by your caregiver or pharmacist. 2. Do not drink alcohol, especially if you are breastfeeding or taking medicine to relieve pain. 3. Do not smoke tobacco. 4. Continue to use good perineal care. Good perineal care includes: 1. Wiping your perineum from back to front 2. Keeping your perineum clean. 3. You can do sitz baths twice a day, to help keep this area clean 5. Do not use tampons, douche or have sex until your caregiver says it is okay. 6. Shower only and avoid sitting in submerged water, aside from sitz baths 7. Wear a well-fitting bra that provides breast support. 8. Eat healthy foods. 9. Drink enough fluids to keep your urine clear or  pale yellow. 10. Eat high-fiber foods such as whole grain cereals and breads, brown rice, beans, and fresh fruits and vegetables every day. These foods [name] help prevent or relieve constipation. 11. Avoid constipation with high fiber foods or medications, such as miralax or metamucil 12. Follow your caregiver's recommendations regarding resumption of activities such as climbing stairs, driving, lifting, exercising, or traveling. 13. Talk to your caregiver about resuming sexual  activities. Resumption of sexual activities is dependent upon your risk of infection, your rate of healing, and your comfort and desire to resume sexual activity. 14. Try to have someone help you with your household activities and your newborn for at least a few days after you leave the hospital. 15. Rest as much as possible. Try to rest or take a nap when your newborn is sleeping. 16. Increase your activities gradually. 17. Keep all of your scheduled postpartum appointments. It is very important to keep your scheduled follow-up appointments. At these appointments, your caregiver will be checking to make sure that you are healing physically and emotionally. SEEK MEDICAL CARE IF:   You are passing large clots from your vagina. Save any clots to show your caregiver.  You have a foul smelling discharge from your vagina.  You have trouble urinating.  You are urinating frequently.  You have pain when you urinate.  You have a change in your bowel movements.  You have increasing redness, pain, or swelling near your vaginal incision (episiotomy) or vaginal tear.  You have pus draining from your episiotomy or vaginal tear.  Your episiotomy or vaginal tear is separating.  You have painful, hard, or reddened breasts.  You have a severe headache.  You have blurred vision or see spots.  You feel sad or depressed.  You have thoughts of hurting yourself or your newborn.  You have questions about your care, the care of your newborn, or medicines.  You are dizzy or light-headed.  You have a rash.  You have nausea or vomiting.  You were breastfeeding and have not had a menstrual period within 12 weeks after you stopped breastfeeding.  You are not breastfeeding and have not had a menstrual period by the 12th week after delivery.  You have a fever. SEEK IMMEDIATE MEDICAL CARE IF:   You have persistent pain.  You have chest pain.  You have shortness of breath.  You faint.  You  have leg pain.  You have stomach pain.  Your vaginal bleeding saturates two or more sanitary pads in 1 hour. MAKE SURE YOU:   Understand these instructions.  Will watch your condition.  Will get help right away if you are not doing well or get worse. Document Released: 05/23/2000 Document Revised: 10/10/2013 Document Reviewed: 01/21/2012 [facility] Patient Information 2015 [place], [place]. This information is not intended to replace advice given to you by your health care provider. Make sure you discuss any questions you have with your health care provider.  Sitz Bath A sitz bath is a warm water bath taken in the sitting position. The water covers only the hips and butt (buttocks). We recommend using one that fits in the toilet, to help with ease of use and cleanliness. It Bottino be used for either healing or cleaning purposes. Sitz baths are also used to relieve pain, itching, or muscle tightening (spasms). The water [place] contain medicine. Moist heat will help you heal and relax.  HOME CARE  Take 3 to 4 sitz baths a day. 18. Fill the bathtub half-full with  warm water. 19. Sit in the water and open the drain a little. 20. Turn on the warm water to keep the tub half-full. Keep the water running constantly. 21. Soak in the water for 15 to 20 minutes. 22. After the sitz bath, pat the affected area dry. GET HELP RIGHT AWAY IF: You get worse instead of better. Stop the sitz baths if you get worse. MAKE SURE YOU:  Understand these instructions.  Will watch your condition.  Will get help right away if you are not doing well or get worse. Document Released: 07/03/2004 Document Revised: 02/18/2012 Document Reviewed: 09/23/2010 [facility] Patient Information 2015 [place], [place]. This information is not intended to replace advice given to you by your health care provider. Make sure you discuss any questions you have with your health care provider.

## 2019-07-29 NOTE — Plan of Care (Signed)
Vs stable; up ad lib; tolerating regular diet; taking tylenol and motrin for pain control; pt complaining of very sore nipples; pt has comfort gels and coconut oil and nipple shield for breastfeeding; pt has also used breast pump this shift

## 2019-07-29 NOTE — Lactation Note (Signed)
This note was copied from a baby's chart. Lactation Consultation Note  Patient Name: [name] Today's Date: 07/29/2019 Reason for consult: Initial assessment   Maternal Data Formula Feeding for Exclusion: No Has patient been taught Hand Expression?: Yes Does the patient have breastfeeding experience prior to this delivery?: Yes Sl Flat nipples but soft around areola, able to shape Feeding Feeding Type: Breast Fed Had been using 20-24 mm nipple shield, using 24 nipple shield when room entered, mom place in side lying position and baby beside her, baby able to latch without shield after several attempts with shaping breast tissue, baby gaggy at first and spit small amt cl. fluid , needs support at breast to maintain latch, pushes nipple out with tongue, did the same with nipple shield  LATCH Score Latch: Repeated attempts needed to sustain latch, nipple held in mouth throughout feeding, stimulation needed to elicit sucking reflex.  Audible Swallowing: A few with stimulation  Type of Nipple: Flat  Comfort (Breast/Nipple): Soft / non-tender  Hold (Positioning): Assistance needed to correctly position infant at breast and maintain latch.  LATCH Score: 6  Interventions Interventions: Breast feeding basics reviewed;Assisted with latch;Skin to skin;Breast massage;Hand express;Breast compression;Adjust position;Coconut oil  Lactation Tools Discussed/Used Tools: Nipple Shields Nipple shield size: 24(not needed, mom using when LC came in ) [facility] Program: Yes Mom shown how to apply nipple shield and instruction in care and washing of shield  Consult Status Consult Status: Follow-up Date: 07/29/19 Follow-up type: In-patient    Dyann Kief 07/29/2019, 12:46 PM

## 2019-07-29 NOTE — Lactation Note (Signed)
This note was copied from a baby's chart. Lactation Consultation Note  Patient Name: [name] Today's Date: 07/29/2019     Maternal Data  Mom states she was able to latch baby to breast at last feeding without nipple shield, fed formula after, states she wants to be d/c'd to home tonight, death in the family and needs to pick up her 26 yrs old son, mom given Medela breast pump parts to take home and shown how to use manual pump, also reviewed breast milk storage and cleaning of parts, given Medela nipple shield instruction sheet if she needs to use these again after discharge   Feeding Feeding Type: Bottle Fed - Formula Nipple Type: Slow - flow  LATCH Score                   Interventions    Lactation Tools Discussed/Used     Consult Status      Dyann Kief 07/29/2019, 7:28 PM

## 2019-07-29 NOTE — Progress Notes (Signed)
Post Partum Day 26  Subjective: Doing well, no concerns. Ambulating without difficulty, pain managed with PO meds, tolerating regular diet, and voiding without difficulty. C/o of some bruised feeling in epigastric area.  No fever/chills, chest pain, shortness of breath, nausea/vomiting, or leg pain. No nipple or breast pain.   Objective: BP 104/72 (BP Location: Left Arm)   Pulse 61   Temp 97.9 F (36.6 C) (Oral)   Resp 18   Ht 5' (1.524 m)   Wt 77.1 kg   LMP 10/24/2018   SpO2 100%   Breastfeeding Unknown   BMI 33.20 kg/m    Physical Exam:  General: alert and cooperative Breasts: soft/nontender CV: RRR Pulm: nl effort Abdomen: soft, non-tender Uterine Fundus: firm Incision: n/a Perineum: repair well approximated Lochia: appropriate DVT Evaluation: No evidence of DVT seen on physical exam.  Recent Labs    07/28/19 1130 07/29/19 0434  HGB 10.4* 10.0*  HCT 33.6* 33.1*  WBC 14.8* 19.6*  PLT 245 234    Assessment/Plan: 24 y.o. [id number] postpartum day # 1  -Continue routine postpartum care -Lactation consult PRN for breastfeeding   -Immunization status: Needs varicella and Rhogam prior to discharge  Disposition: Continue inpatient postpartum care. Desires discharge home today, will d/c if baby gets a d/c order.   LOS: 26 day   [name], CNM 08/05/2019, 8:10 AM

## 2019-07-29 NOTE — Anesthesia Postprocedure Evaluation (Signed)
Anesthesia Post Note  Patient: [name]  Procedure(s) Performed: AN AD HOC LABOR EPIDURAL  Patient location during evaluation: Mother Baby Anesthesia Type: Epidural Level of consciousness: awake and alert and oriented Pain management: pain level controlled Vital Signs Assessment: post-procedure vital signs reviewed and stable Respiratory status: nonlabored ventilation and respiratory function stable Cardiovascular status: stable Postop Assessment: no headache, no backache, patient able to bend at knees, no apparent nausea or vomiting, able to ambulate and adequate PO intake Anesthetic complications: no     Last Vitals:  Vitals:   07/29/19 0341 07/29/19 0727  BP: 109/82 104/72  Pulse: 74 61  Resp: 16 18  Temp: 36.8 C 36.6 C  SpO2: 99% 100%    Last Pain:  Vitals:   07/29/19 0815  TempSrc:   PainSc: 2                  Zachary George

## 2019-07-29 NOTE — Progress Notes (Signed)
Patient discharged home with infant. Discharge instructions and prescriptions given and reviewed with patient. Patient verbalized understanding.  Escorted out by staff. Encouraged mom to call ASAP and schedule follow-up postpartum appointment.

## 2019-07-29 NOTE — Progress Notes (Signed)
RNCM assessed patient at bedside, she was sitting on bed with FOB sitting in chair holding baby. Explained CM role at hospital and patient reported she was agreeable to talk with this CM and she wanted her husband to stay there. Patient reports that they live in a single level home and have 26 year old son at home. They report having all necessary equipment in the home and they have chosen [facility] for their pediatrician. Patient reports that she does drive and can take herself back and forth to appointments. 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 She reports that she has seen a counselor in the past but is not currently on medications. She also reports she is unsure is she can see this counselor because it has been a few years. Encouraged patient to follow up with her PCP and allow her to refer her to someone but that in the meantime to be very aware of any symptoms of impending depression. Patient verbalizes that she is very aware of the symptoms and is able to monitor for symptoms and seek treatment if needed.  RNCM will remain available as needed but no further needs identified at this time. 

## 2019-07-30 LAB — RHOGAM INJECTION: Unit division: 0

## 2021-01-14 ENCOUNTER — Other Ambulatory Visit: Payer: Self-pay | Admitting: Obstetrics and Gynecology

## 2021-02-15 ENCOUNTER — Inpatient Hospital Stay: Admission: RE | Admit: 2021-02-15 | Payer: Medicaid Other | Source: Ambulatory Visit

## 2021-02-25 ENCOUNTER — Ambulatory Visit
Admission: RE | Admit: 2021-02-25 | Payer: Medicaid Other | Source: Home / Self Care | Admitting: Obstetrics and Gynecology

## 2021-02-25 ENCOUNTER — Encounter: Admission: RE | Payer: Self-pay | Source: Home / Self Care

## 2021-02-25 SURGERY — LAPAROSCOPY, DIAGNOSTIC, ROBOT-ASSISTED
Anesthesia: Choice

## 2021-04-04 NOTE — H&P (Signed)
[name] is a 25 y.o. female presenting with Pre Op Consulting (Sign consents) on 04/04/2021   History of Present Illness: Pt presents today for surgical evaluation. She has a long hx of pelvic pain. Her pain is a tight band around her belly, worse with menses, but it is always present. Began with menarche at 26. She has tried depo, Nexplanon, IUD, Sprintec and doxycycline for pain control and nothing has helped. She has followed up with [facility] who suggested pills which did not help her. She was a former pt of Dr. Elesa Massed who had already planned for an exploratory laparoscopy with her and she has requested this and declines restarting pills.    Today: Vacation to [place], [place] 1 week after surgery. She is absolutely terrified for surgery.    Pertinent Hx: -Last pap smear: 2020 NILM  -History of abnormal pap smears: no -Sexually active: yes -Current contraception: withdrawal             -Nexplanon, depo, pills, IUD.  -History of sexually transmitted infections: chlamydia    -Chronic pelvic pain: Years of dull stabbing vaginal and pelvic pain both with and without period and with intercourse.    Past Medical History:  has a past medical history of Allergic rhinitis, Anxiety, Depression, GERD (gastroesophageal reflux disease), and Migraines.  Past Surgical History:  has a past surgical history that includes Cholecystectomy; THROAT SURGERY; and WISDOM TEETH. Family History: family history includes Diabetes type II in her father; Heart disease in her father; No Known Problems in her mother. Social History:  reports that she has quit smoking. She has never used smokeless tobacco. She reports previous alcohol use. She reports that she does not use drugs. OB/GYN History:  OB History     Gravida 3   Para 2   Term 2   Preterm     AB 1   Living 2     SAB 1   IAB     Ectopic     Molar     Multiple 0   Live Births 2        Allergies: is allergic to  penicillin. Medications:   Current Outpatient Medications:    acetaminophen (TYLENOL) 325 MG tablet, Take 650 mg by mouth every 4 (four) hours as needed for Pain, Disp: , Rfl:    chlorhexidine (PERIDEX) 0.12 % solution, RINSE WITH CAPFUL ONCE DAILY. DO NOT RINSE, EAT OR DRINK 30 MINUTES AFTER USE (Patient not taking: No sig reported), Disp: , Rfl:    conjugated estrogens (PREMARIN) 0.625 mg/gram vaginal cream, Place 0.5 g vaginally once daily Daily for two weeks, every other day for two weeks, then twice a week thereafter. (Patient not taking: Reported on 05/31/2020  ), Disp: 42.5 g, Rfl: 1   dicyclomine (BENTYL) 20 mg tablet, , Disp: , Rfl:    famotidine (PEPCID) 20 MG tablet, Take 20 mg by mouth once daily (Patient not taking: No sig reported), Disp: , Rfl:    ibuprofen (MOTRIN) 800 MG tablet, Take 1 tablet (800 mg total) by mouth every 8 (eight) hours as needed for Pain (Patient not taking: No sig reported), Disp: 90 tablet, Rfl: 3   meloxicam (MOBIC) 7.5 MG tablet, TAKE 1 TABLET BY MOUTH ONCE DAILY FOR 7 DAYS (Patient not taking: No sig reported), Disp: , Rfl:    metroNIDAZOLE (METROGEL) 0.75 % (37.5mg /5 gram) vaginal gel, INSERT 1 APPLICATORFUL VAGINALLY NIGHTLY FOR 5 NIGHTS (Patient not taking: No sig reported), Disp: , Rfl:  norgestimate-ethinyl estradioL (SPRINTEC 0.25/35, 28,) 0.25-35 mg-mcg tablet, Take 1 tablet by mouth once daily Continuous dosing.  Do not pause or take placebo (Patient not taking: Reported on 04/04/2021), Disp: 84 tablet, Rfl: 1   sodium fluoride-pot nitrate 1.1-5 %, BRUSH NORMAL. APPLY A PEA-SIZED AMOUNT AND ALLOW TO SIT FOR 1 MINUTE. DO NOT EAT, DRINK OR RINSE. SPIT OUT EXCESS (Patient not taking: No sig reported), Disp: , Rfl:    Review of Systems: No SOB, no palpitations or chest pain, no new lower extremity edema, no nausea or vomiting or bowel or bladder complaints. See HPI for gyn specific ROS.    Exam:   BP 130/78   Pulse 62   Ht 152.4 cm (5')   Wt 70.2  kg (154 lb 12.8 oz)   LMP 03/29/2021   Breastfeeding No   BMI 30.23 kg/m    General: Patient is well-groomed, well-nourished, appears stated age in no acute distress   HEENT: head is atraumatic and normocephalic, trachea is midline, neck is supple with no palpable nodules   CV: Regular rhythm and normal heart rate, no murmur   Pulm: Clear to auscultation throughout lung fields with no wheezing, crackles, or rhonchi. No increased work of breathing   Abdomen: soft , no mass, non-tender, no rebound tenderness, no hepatomegaly   Pelvic: deferred    Impression:   The primary encounter diagnosis was Preop examination. Diagnoses of Pelvic pain in female, Dyspareunia in female, Dysmenorrhea, Cervical motion tenderness, and Uterine tenderness were also pertinent to this visit.   Plan:   1. Pelvic Pain, vaginismus, pelvic floor myofascial upregulation Patient returns for a preoperative discussion regarding her plans to proceed with surgical treatment of her Chronic pelvic pain by dx laparoscopy with excision of endometriosis, possible lysis of adhesions. procedure. The patient and I discussed the technical aspects of the procedure including the potential for risks and complications.  These include but are not limited to the risk of infection requiring post-operative antibiotics or further procedures.  We talked about the risk of injury to adjacent organs including bladder, bowel, ureter, blood vessels or nerves.  We talked about the need to convert to an open incision.  We talked about the possible need for blood transfusion.  We talked about postop complications such as thromboembolic or cardiopulmonary complications.  All of her questions were answered.  Her preoperative exam was completed and the appropriate consents were signed. She is scheduled to undergo this procedure in the near future.   Specific Peri-operative Considerations:  - Consent: obtained today - Health Maintenance:  - Labs:  CBC, CMP preoperatively - Studies: EKG, CXR preoperatively - Bowel Preparation: None required - Abx:  Cefoxitin 2g - VTE ppx: SCDs perioperatively   Plan for suppressive meds after surgery if we find endo, and consider orlissa anyway She does not like gabapentin, will plan for percocet which she will only take if she needs it.      Return for Postop check.

## 2021-04-09 ENCOUNTER — Other Ambulatory Visit: Payer: Self-pay

## 2021-04-09 ENCOUNTER — Encounter
Admission: RE | Admit: 2021-04-09 | Discharge: 2021-04-09 | Disposition: A | Discharge status: Home / Self Care | Payer: Medicaid Other | Source: Ambulatory Visit | Attending: Obstetrics and Gynecology | Admitting: Obstetrics and Gynecology

## 2021-04-09 NOTE — Patient Instructions (Signed)
Your procedure is scheduled on: Friday 11/11 Report to registration desk in the medical mall then to Day Surgery. To find out your arrival time please call [phone] between 1PM - 3PM on Thurs 11/10.  Remember: Instructions that are not followed completely Gelb result in serious medical risk,  up to and including death, or upon the discretion of your surgeon and anesthesiologist your  surgery Munro need to be rescheduled.     _X__ 1. Do not eat food after midnight the night before your procedure.                 No chewing gum or hard candies. You Connelly drink clear liquids up to 2 hours                 before you are scheduled to arrive for your surgery- DO not drink clear                 liquids within 2 hours of the start of your surgery.                 Clear Liquids include:  water, apple juice without pulp, clear Gatorade, G2 or                  Gatorade Zero (avoid Red/Purple/Blue), Black Coffee or Tea (Do not add                 anything to coffee or tea). __X__2.  On the morning of surgery brush your teeth with toothpaste and water, you                [place] rinse your mouth with mouthwash if you wish.  Do not swallow any toothpaste of mouthwash.     _X__ 3.  No Alcohol for 24 hours before or after surgery.   ___ 4.  Do Not Smoke or use e-cigarettes For 24 Hours Prior to Your Surgery.                 Do not use any chewable tobacco products for at least 6 hours prior to                 Surgery.  __  5.  Do not use any recreational drugs (marijuana, cocaine, heroin, ecstasy,       MDMA or other) For at least one week prior to your surgery.            Combination of these drugs with anesthesia [place] have life threatening       results.  ____  6.  Bring all medications with you on the day of surgery if instructed.   __x__  7.  Notify your doctor if there is any change in your medical condition      (cold, fever, infections).     Do not wear jewelry, make-up,  hairpins, clips or nail polish. Do not wear lotions, powders, or perfumes. You Neyhart wear deodorant. Do not shave 48 hours prior to surgery.  Do not bring valuables to the hospital.    [facility] is not responsible for any belongings or valuables.  Contacts, dentures or bridgework Pugh not be worn into surgery. Leave your suitcase in the car. After surgery it Leng be brought to your room. For patients admitted to the hospital, discharge time is determined by your treatment team.   Patients discharged the day of surgery will not be allowed to drive home.   Make arrangements  for someone to be with you for the first 24 hours of your Same Day Discharge.    Please read over the following fact sheets that you were given:   Incentive spirometer   x ____ Take these medicines the morning of surgery with A SIP OF WATER:    1. fluticasone (FLONASE) 50 MCG/ACT nasal spray if needed  2.   3.   4.  5.  6.  ____ Fleet Enema (as directed)   __x__ Use CHG Soap (or wipes) as directed  ____ Use Benzoyl Peroxide Gel as instructed  ____ Use inhalers on the day of surgery  ____ Stop metformin 2 days prior to surgery    ____ Take 1/2 of usual insulin dose the night before surgery. No insulin the morning          of surgery.   ____ Stop Coumadin/Plavix/aspirin on   __x__ Stop Anti-inflammatories ibuprofen aleve and aspirin products (aspirin-acetaminophen-caffeine (EXCEDRIN MIGRAINE) 706-237-62 MG tablet) on 11/4  Garoutte continue tylenol   ____ Stop supplements until after surgery.    ____ Bring C-Pap to the hospital.    If you have any questions regarding your pre-procedure instructions,  Please call Pre-admit Testing at [phone]

## 2021-04-10 ENCOUNTER — Encounter
Admission: RE | Admit: 2021-04-10 | Discharge: 2021-04-10 | Disposition: A | Discharge status: Home / Self Care | Payer: Medicaid Other | Source: Ambulatory Visit | Attending: Obstetrics and Gynecology | Admitting: Obstetrics and Gynecology

## 2021-04-10 DIAGNOSIS — R102 Pelvic and perineal pain: Secondary | ICD-10-CM | POA: Insufficient documentation

## 2021-04-10 DIAGNOSIS — Z01812 Encounter for preprocedural laboratory examination: Secondary | ICD-10-CM | POA: Diagnosis present

## 2021-04-10 DIAGNOSIS — G8929 Other chronic pain: Secondary | ICD-10-CM | POA: Insufficient documentation

## 2021-04-10 LAB — BASIC METABOLIC PANEL
Anion gap: 6 (ref 5–15)
BUN: 9 mg/dL (ref 6–20)
CO2: 26 mmol/L (ref 22–32)
Calcium: 9 mg/dL (ref 8.9–10.3)
Chloride: 105 mmol/L (ref 98–111)
Creatinine, Ser: 0.82 mg/dL (ref 0.44–1.00)
GFR, Estimated: >60 mL/min (ref >60)
Glucose, Bld: 83 mg/dL (ref 70–99)
Potassium: 4.2 mmol/L (ref 3.5–5.1)
Sodium: 137 mmol/L (ref 135–145)

## 2021-04-10 LAB — CBC
HCT: 44.3 % (ref 36.0–46.0)
Hemoglobin: 13.6 g/dL (ref 12.0–15.0)
MCH: 26.8 pg (ref 26.0–34.0)
MCHC: 30.7 g/dL (ref 30.0–36.0)
MCV: 87.4 fL (ref 80.0–100.0)
Platelets: 239 10*3/uL (ref 150–400)
RBC: 5.07 MIL/uL (ref 3.87–5.11)
RDW: 12.9 % (ref 11.5–15.5)
WBC: 8.5 10*3/uL (ref 4.0–10.5)
nRBC: 0 % (ref 0.0–0.2)

## 2021-04-10 LAB — TYPE AND SCREEN
ABO/RH(D): B NEG
Antibody Screen: NEGATIVE

## 2021-04-13 ENCOUNTER — Other Ambulatory Visit: Payer: Self-pay

## 2021-04-13 ENCOUNTER — Encounter: Payer: Self-pay | Admitting: Emergency Medicine

## 2021-04-13 ENCOUNTER — Ambulatory Visit
Admission: EM | Admit: 2021-04-13 | Discharge: 2021-04-13 | Disposition: A | Discharge status: Home / Self Care | Payer: Medicaid Other | Attending: Emergency Medicine | Admitting: Emergency Medicine

## 2021-04-13 DIAGNOSIS — J0100 Acute maxillary sinusitis, unspecified: Secondary | ICD-10-CM

## 2021-04-13 MED ORDER — DOXYCYCLINE HYCLATE 100 MG PO CAPS: 100.0000 mg | ORAL_CAPSULE | Freq: Two times a day (BID) | ORAL | 0 refills | Status: DC

## 2021-04-13 NOTE — ED Provider Notes (Signed)
[facility]    CSN: [id number] Arrival date & time: 04/13/21  6767      History   Chief Complaint Chief Complaint  Patient presents with   Headache   Nasal Congestion    HPI [name] is a 25 y.o. female.   HPI  36 old female here for evaluation of sinus symptoms.  Patient reports that she has been experiencing nasal congestion for last 2-1/2 months with sinus pressure in her cheeks and between her eyes for the last month.  She developed significant allover generalized head pressure that intensified when she leaned forward or when she sneezes, coughs, or blows her nose yesterday.  She states that she is starting to produce some green nasal discharge but has been unable to get anything up for the last month.  She denies fever, ear pain or pressure, cough, or GI complaints.  Patient has not been evaluated previously for her sinus issues.  Past Medical History:  Diagnosis Date   Anemia    Anxiety    Bladder infection, acute 01/10/2015   completed antibiotics and denies any s&s of infection (01-30-15)   Dysrhythmia    occ irregular heart beat per pt   GERD (gastroesophageal reflux disease)    NO MEDS   Headache    MIGRAINES DAILY    Patient Active Problem List   Diagnosis Date Noted   Encounter for elective induction of labor 07/28/2019   Back pain complicating pregnancy 20/94/7096   Pelvic pain in pregnancy 05/14/2019   Palpitations 06/07/2018   Atypical chest pain 06/07/2018   Anxiety 06/07/2018   Syncope 28/36/6294   Periumbilical abdominal pain 03/12/2015   Gallstones 01/29/2015    Past Surgical History:  Procedure Laterality Date   ADENOIDECTOMY     CHOLECYSTECTOMY N/A 02/01/2015   Procedure: LAPAROSCOPIC CHOLECYSTECTOMY WITH INTRAOPERATIVE CHOLANGIOGRAM;  Surgeon: Robert Bellow, MD;  Location: [facility] ORS;  Service: General;  Laterality: N/A;   CHOLECYSTECTOMY     TONSILLECTOMY     WISDOM TOOTH EXTRACTION      OB History     Gravida   3   Para  2   Term  2   Preterm  0   AB  1   Living  2      SAB  1   IAB  0   Ectopic  0   Multiple  0   Live Births  2        Obstetric Comments  1st Menstrual Cycle:  11 1st Pregnancy:  17           Home Medications    Prior to Admission medications   Medication Sig Start Date End Date Taking? Authorizing Provider  acetaminophen (TYLENOL) 500 MG tablet Take 2 tablets (1,000 mg total) by mouth every 6 (six) hours as needed for mild pain or moderate pain. Patient taking differently: Take 500-1,000 mg by mouth every 6 (six) hours as needed for mild pain or moderate pain. 06/19/19  Yes Lisette Grinder, CNM  doxycycline (VIBRAMYCIN) 100 MG capsule Take 1 capsule (100 mg total) by mouth 2 (two) times daily. 04/13/21  Yes Margarette Canada, NP  aspirin-acetaminophen-caffeine (EXCEDRIN MIGRAINE) [phone] MG tablet Take 1 tablet by mouth every 6 (six) hours as needed for headache or migraine.    [provider]  fluticasone (FLONASE) 50 MCG/ACT nasal spray Place 1 spray into both nostrils daily as needed for rhinitis.    [provider]  ibuprofen (ADVIL) 200 MG tablet  Take 400 mg by mouth every 6 (six) hours as needed for mild pain or moderate pain.    [provider]    Family History Family History  Problem Relation Age of Onset   Healthy Mother    Hypertension Father    Hyperlipidemia Father    Arrhythmia Father    Cancer Maternal Aunt        breast   CAD Paternal Grandmother        d. 20   CAD Paternal Grandfather        d. 87    Social History Social History   Tobacco Use   Smoking status: Former    Packs/day: 0.50    Years: 8.00    Pack years: 4.00    Types: Cigarettes    Quit date: 2019    Years since quitting: 3.8   Smokeless tobacco: Never  Vaping Use   Vaping Use: Never used  Substance Use Topics   Alcohol use: Not Currently    Alcohol/week: 0.0 standard drinks    Comment: seldom   Drug use: No      Allergies   Amoxicillin, Penicillin g, and Penicillins   Review of Systems Review of Systems  Constitutional:  Negative for activity change, appetite change and fever.  HENT:  Positive for congestion and sinus pain. Negative for ear pain, rhinorrhea and sore throat.   Respiratory:  Negative for cough.   Gastrointestinal:  Negative for diarrhea, nausea and vomiting.  Skin:  Negative for rash.  Neurological:  Positive for headaches.  Hematological: Negative.   Psychiatric/Behavioral: Negative.      Physical Exam Triage Vital Signs ED Triage Vitals  Enc Vitals Group     BP 04/13/21 1054 111/89     Pulse Rate 04/13/21 1054 69     Resp 04/13/21 1054 14     Temp 04/13/21 1054 97.9 F (36.6 C)     Temp Source 04/13/21 1054 Oral     SpO2 04/13/21 1054 100 %     Weight 04/13/21 1051 155 lb (70.3 kg)     Height 04/13/21 1051 5' 1"  (1.549 m)     Head Circumference --      Peak Flow --      Pain Score 04/13/21 1051 10     Pain Loc --      Pain Edu? --      Excl. in [place]? --    No data found.  Updated Vital Signs BP 111/89 (BP Location: Left Arm)   Pulse 69   Temp 97.9 F (36.6 C) (Oral)   Resp 14   Ht 5' 1"  (1.549 m)   Wt 155 lb (70.3 kg)   LMP 03/29/2021 (Exact Date)   SpO2 100%   Breastfeeding No   BMI 29.29 kg/m   Visual Acuity Right Eye Distance:   Left Eye Distance:   Bilateral Distance:    Right Eye Near:   Left Eye Near:    Bilateral Near:     Physical Exam Vitals and nursing note reviewed.  Constitutional:      General: She is not in acute distress.    Appearance: Normal appearance. She is not ill-appearing.  HENT:     Head: Normocephalic and atraumatic.     Right Ear: Tympanic membrane, ear canal and external ear normal. There is no impacted cerumen.     Left Ear: Tympanic membrane, ear canal and external ear normal. There is no impacted cerumen.     Nose:  Congestion and rhinorrhea present.     Comments: Bilateral maxillary sinuses are tender  to percussion.    Mouth/Throat:     Mouth: Mucous membranes are moist.     Pharynx: Oropharynx is clear. No posterior oropharyngeal erythema.  Cardiovascular:     Rate and Rhythm: Normal rate and regular rhythm.     Pulses: Normal pulses.     Heart sounds: Normal heart sounds. No murmur heard.   No gallop.  Pulmonary:     Effort: Pulmonary effort is normal.     Breath sounds: Normal breath sounds. No wheezing, rhonchi or rales.  Musculoskeletal:     Cervical back: Normal range of motion and neck supple.  Lymphadenopathy:     Cervical: No cervical adenopathy.  Skin:    General: Skin is warm and dry.     Capillary Refill: Capillary refill takes less than 2 seconds.     Findings: No erythema or rash.  Neurological:     General: No focal deficit present.     Mental Status: She is alert and oriented to person, place, and time.  Psychiatric:        Mood and Affect: Mood normal.        Behavior: Behavior normal.        Thought Content: Thought content normal.        Judgment: Judgment normal.     UC Treatments / Results  Labs (all labs ordered are listed, but only abnormal results are displayed) Labs Reviewed - No data to display  EKG   Radiology No results found.  Procedures Procedures (including critical care time)  Medications Ordered in UC Medications - No data to display  Initial Impression / Assessment and Plan / UC Course  I have reviewed the triage vital signs and the nursing notes.  Pertinent labs & imaging results that were available during my care of the patient were reviewed by me and considered in my medical decision making (see chart for details).  Patient is a nontoxic-appearing 65 from female here for evaluation of sinus pressure and nasal congestion that has been going on for months.  Patient states that she thought it was her allergies but has not been responding to allergy treatment.  She indicates that her allergy symptoms last for varying lengths of  time but agrees that they have never lasted this long.  Patient's physical exam reveals pearly-gray tympanic membrane's bilaterally with normal light reflex and clear external auditory canals.  Nasal mucosa is markedly edematous and erythematous.  I am unable to visualize the turbinates due to the edema.  There is some green nasal discharge present in the left nare.  Bilateral maxillary sinuses are tender to percussion.  Oropharyngeal exam is benign.  No cervical lymphadenopathy appreciated on exam.  Cardiopulmonary exam reveals clear lung sounds in all fields.  Patient exam is consistent with maxillary sinusitis.  She is allergic to amoxicillin and penicillin so I will treat her with doxycycline twice daily for 10 days.  Typically I would prescribe prednisone in someone with symptoms of this duration but she is scheduled to have surgery next week and I do not want to increase her bleeding time.  Patient advised to continue sinus irrigation, take over-the-counter Mucinex to break up her mucus tenacity, increase oral fluid intake to thin her mucus out.  Work note provided.   Final Clinical Impressions(s) / UC Diagnoses   Final diagnoses:  Acute non-recurrent maxillary sinusitis     Discharge Instructions  The Doxycycline twice daily with food for 10 days for treatment of your sinusitis.  Perform sinus irrigation 2-3 times a day with a NeilMed sinus rinse kit and distilled water.  Do not use tap water.  You can use plain over-the-counter Mucinex every 6 hours to break up the stickiness of the mucus so your body can clear it.  Increase your oral fluid intake to thin out your mucus so that is also able for your body to clear more easily.  Take an over-the-counter probiotic, such as Culturelle-align-activia, 1 hour after each dose of antibiotic to prevent diarrhea.  If you develop any new or worsening symptoms return for reevaluation or see your primary care provider.    ED Prescriptions      Medication Sig Dispense Auth. Provider   doxycycline (VIBRAMYCIN) 100 MG capsule Take 1 capsule (100 mg total) by mouth 2 (two) times daily. 20 capsule Margarette Canada, NP      PDMP not reviewed this encounter.   Margarette Canada, NP 04/13/21 1154

## 2021-04-13 NOTE — Discharge Instructions (Signed)
The Doxycycline twice daily with food for 10 days for treatment of your sinusitis.  Perform sinus irrigation 2-3 times a day with a NeilMed sinus rinse kit and distilled water.  Do not use tap water.  You can use plain over-the-counter Mucinex every 6 hours to break up the stickiness of the mucus so your body can clear it.  Increase your oral fluid intake to thin out your mucus so that is also able for your body to clear more easily.  Take an over-the-counter probiotic, such as Culturelle-align-activia, 1 hour after each dose of antibiotic to prevent diarrhea.  If you develop any new or worsening symptoms return for reevaluation or see your primary care provider.

## 2021-04-13 NOTE — ED Triage Notes (Signed)
Patient c/o sinus pain and pressure for a month.  Patient states that she started having pressure in her head yesterday.  Patient denies fevers.

## 2021-04-18 MED ORDER — CHLORHEXIDINE GLUCONATE 0.12 % MT SOLN: 15.0000 mL | Freq: Once | OROMUCOSAL | Status: AC

## 2021-04-18 MED ORDER — POVIDONE-IODINE 10 % EX SWAB: 2.0000 "application " | Freq: Once | CUTANEOUS | Status: DC

## 2021-04-18 MED ORDER — ORAL CARE MOUTH RINSE: 15.0000 mL | Freq: Once | OROMUCOSAL | Status: AC

## 2021-04-18 MED ORDER — ACETAMINOPHEN 500 MG PO TABS: 1000.0000 mg | ORAL_TABLET | ORAL | Status: AC

## 2021-04-18 MED ORDER — GABAPENTIN 300 MG PO CAPS: 300.0000 mg | ORAL_CAPSULE | ORAL | Status: DC

## 2021-04-18 MED ORDER — LACTATED RINGERS IV SOLN: INTRAVENOUS | Status: DC

## 2021-04-18 MED ORDER — FAMOTIDINE 20 MG PO TABS: 20.0000 mg | ORAL_TABLET | Freq: Once | ORAL | Status: AC

## 2021-04-19 ENCOUNTER — Ambulatory Visit: Payer: Medicaid Other

## 2021-04-19 ENCOUNTER — Ambulatory Visit
Admission: RE | Admit: 2021-04-19 | Discharge: 2021-04-19 | Disposition: A | Discharge status: Home / Self Care | Payer: Medicaid Other | Attending: Obstetrics and Gynecology | Admitting: Obstetrics and Gynecology

## 2021-04-19 ENCOUNTER — Encounter
Admission: RE | Disposition: A | Discharge status: Home / Self Care | Payer: Self-pay | Source: Home / Self Care | Attending: Obstetrics and Gynecology

## 2021-04-19 ENCOUNTER — Encounter: Payer: Self-pay | Admitting: Obstetrics and Gynecology

## 2021-04-19 ENCOUNTER — Ambulatory Visit: Payer: Medicaid Other | Admitting: Urgent Care

## 2021-04-19 DIAGNOSIS — N80329 Endometriosis of the posterior cul-de-sac, unspecified depth: Secondary | ICD-10-CM | POA: Diagnosis not present

## 2021-04-19 DIAGNOSIS — R102 Pelvic and perineal pain: Secondary | ICD-10-CM | POA: Insufficient documentation

## 2021-04-19 DIAGNOSIS — K219 Gastro-esophageal reflux disease without esophagitis: Secondary | ICD-10-CM | POA: Insufficient documentation

## 2021-04-19 DIAGNOSIS — Z9049 Acquired absence of other specified parts of digestive tract: Secondary | ICD-10-CM | POA: Insufficient documentation

## 2021-04-19 DIAGNOSIS — G8929 Other chronic pain: Secondary | ICD-10-CM | POA: Insufficient documentation

## 2021-04-19 DIAGNOSIS — N80103 Endometriosis of bilateral ovaries, unspecified depth: Secondary | ICD-10-CM | POA: Insufficient documentation

## 2021-04-19 DIAGNOSIS — Z87891 Personal history of nicotine dependence: Secondary | ICD-10-CM | POA: Diagnosis not present

## 2021-04-19 DIAGNOSIS — N942 Vaginismus: Secondary | ICD-10-CM | POA: Diagnosis not present

## 2021-04-19 DIAGNOSIS — M9905 Segmental and somatic dysfunction of pelvic region: Secondary | ICD-10-CM | POA: Diagnosis not present

## 2021-04-19 HISTORY — PX: XI ROBOT ASSISTED DIAGNOSTIC LAPAROSCOPY: SHX6815

## 2021-04-19 HISTORY — PX: CYSTOSCOPY W/ RETROGRADES: SHX1426

## 2021-04-19 LAB — POCT PREGNANCY, URINE: Preg Test, Ur: NEGATIVE

## 2021-04-19 SURGERY — LAPAROSCOPY, DIAGNOSTIC, ROBOT-ASSISTED
Anesthesia: General

## 2021-04-19 MED ORDER — OXYCODONE HCL 5 MG PO TABS: 5.0000 mg | ORAL_TABLET | ORAL | 0 refills | Status: DC | PRN

## 2021-04-19 MED ORDER — DEXMEDETOMIDINE (PRECEDEX) IN NS 20 MCG/5ML (4 MCG/ML) IV SYRINGE
PREFILLED_SYRINGE | INTRAVENOUS | Status: AC
  Filled 2021-04-19: qty 5

## 2021-04-19 MED ORDER — FENTANYL CITRATE (PF) 100 MCG/2ML IJ SOLN
INTRAMUSCULAR | Status: DC | PRN
  Administered 2021-04-19 (×2): 50 ug via INTRAVENOUS

## 2021-04-19 MED ORDER — CHLORHEXIDINE GLUCONATE 0.12 % MT SOLN
OROMUCOSAL | Status: AC
  Administered 2021-04-19: 15 mL via OROMUCOSAL
  Filled 2021-04-19: qty 15

## 2021-04-19 MED ORDER — LIDOCAINE HCL (PF) 2 % IJ SOLN
INTRAMUSCULAR | Status: AC
  Filled 2021-04-19: qty 5

## 2021-04-19 MED ORDER — GABAPENTIN 300 MG PO CAPS
ORAL_CAPSULE | ORAL | Status: AC
  Filled 2021-04-19: qty 1

## 2021-04-19 MED ORDER — 0.9 % SODIUM CHLORIDE (POUR BTL) OPTIME
TOPICAL | Status: DC | PRN
  Administered 2021-04-19: 500 mL

## 2021-04-19 MED ORDER — PHENYLEPHRINE HCL (PRESSORS) 10 MG/ML IV SOLN
INTRAVENOUS | Status: AC
  Filled 2021-04-19: qty 1

## 2021-04-19 MED ORDER — OXYCODONE HCL 5 MG PO TABS: 5.0000 mg | ORAL_TABLET | Freq: Once | ORAL | Status: AC | PRN

## 2021-04-19 MED ORDER — DEXAMETHASONE SODIUM PHOSPHATE 10 MG/ML IJ SOLN
INTRAMUSCULAR | Status: AC
  Filled 2021-04-19: qty 1

## 2021-04-19 MED ORDER — PROPOFOL 10 MG/ML IV BOLUS
INTRAVENOUS | Status: DC | PRN
  Administered 2021-04-19: 10 mg via INTRAVENOUS
  Administered 2021-04-19: 190 mg via INTRAVENOUS

## 2021-04-19 MED ORDER — FENTANYL CITRATE (PF) 100 MCG/2ML IJ SOLN
INTRAMUSCULAR | Status: AC
  Filled 2021-04-19: qty 2

## 2021-04-19 MED ORDER — LIDOCAINE HCL (CARDIAC) PF 100 MG/5ML IV SOSY
PREFILLED_SYRINGE | INTRAVENOUS | Status: DC | PRN
  Administered 2021-04-19: 80 mg via INTRAVENOUS

## 2021-04-19 MED ORDER — DEXMEDETOMIDINE (PRECEDEX) IN NS 20 MCG/5ML (4 MCG/ML) IV SYRINGE
PREFILLED_SYRINGE | INTRAVENOUS | Status: DC | PRN
  Administered 2021-04-19: 8 ug via INTRAVENOUS
  Administered 2021-04-19 (×2): 10 ug via INTRAVENOUS
  Administered 2021-04-19: 12 ug via INTRAVENOUS

## 2021-04-19 MED ORDER — FENTANYL CITRATE (PF) 100 MCG/2ML IJ SOLN: 25.0000 ug | INTRAMUSCULAR | Status: DC | PRN

## 2021-04-19 MED ORDER — IOHEXOL 180 MG/ML  SOLN
INTRAMUSCULAR | Status: DC | PRN
  Administered 2021-04-19: 5 mL

## 2021-04-19 MED ORDER — ACETAMINOPHEN 500 MG PO TABS
ORAL_TABLET | ORAL | Status: AC
  Administered 2021-04-19: 1000 mg via ORAL
  Filled 2021-04-19: qty 2

## 2021-04-19 MED ORDER — BUPIVACAINE HCL (PF) 0.5 % IJ SOLN
INTRAMUSCULAR | Status: DC | PRN
  Administered 2021-04-19: 7 mL
  Administered 2021-04-19: 14 mL

## 2021-04-19 MED ORDER — EPHEDRINE SULFATE 50 MG/ML IJ SOLN
INTRAMUSCULAR | Status: DC | PRN
  Administered 2021-04-19 (×6): 5 mg via INTRAVENOUS

## 2021-04-19 MED ORDER — GABAPENTIN 800 MG PO TABS: 800.0000 mg | ORAL_TABLET | Freq: Every day | ORAL | 0 refills | Status: DC

## 2021-04-19 MED ORDER — LACTATED RINGERS IV SOLN
INTRAVENOUS | Status: DC | PRN
Start: 2021-04-19 — End: 2021-04-19

## 2021-04-19 MED ORDER — GLYCOPYRROLATE 0.2 MG/ML IJ SOLN
INTRAMUSCULAR | Status: AC
  Filled 2021-04-19: qty 1

## 2021-04-19 MED ORDER — GLYCOPYRROLATE 0.2 MG/ML IJ SOLN
INTRAMUSCULAR | Status: DC | PRN
  Administered 2021-04-19: .2 mg via INTRAVENOUS

## 2021-04-19 MED ORDER — DOCUSATE CALCIUM 240 MG PO CAPS: 240.0000 mg | ORAL_CAPSULE | Freq: Every day | ORAL | 0 refills | Status: DC

## 2021-04-19 MED ORDER — FAMOTIDINE 20 MG PO TABS
ORAL_TABLET | ORAL | Status: AC
  Administered 2021-04-19: 20 mg via ORAL
  Filled 2021-04-19: qty 1

## 2021-04-19 MED ORDER — DEXAMETHASONE SODIUM PHOSPHATE 10 MG/ML IJ SOLN
INTRAMUSCULAR | Status: DC | PRN
  Administered 2021-04-19: 10 mg via INTRAVENOUS

## 2021-04-19 MED ORDER — MIDAZOLAM HCL 2 MG/2ML IJ SOLN
INTRAMUSCULAR | Status: DC | PRN
  Administered 2021-04-19: 2 mg via INTRAVENOUS

## 2021-04-19 MED ORDER — OXYCODONE HCL 5 MG/5ML PO SOLN: 5.0000 mg | Freq: Once | ORAL | Status: AC | PRN

## 2021-04-19 MED ORDER — IBUPROFEN 800 MG PO TABS: 800.0000 mg | ORAL_TABLET | Freq: Three times a day (TID) | ORAL | 1 refills | Status: AC

## 2021-04-19 MED ORDER — ONDANSETRON HCL 4 MG/2ML IJ SOLN
INTRAMUSCULAR | Status: AC
  Filled 2021-04-19: qty 2

## 2021-04-19 MED ORDER — ROCURONIUM BROMIDE 100 MG/10ML IV SOLN
INTRAVENOUS | Status: DC | PRN
  Administered 2021-04-19: 10 mg via INTRAVENOUS
  Administered 2021-04-19: 50 mg via INTRAVENOUS
  Administered 2021-04-19 (×3): 10 mg via INTRAVENOUS

## 2021-04-19 MED ORDER — ACETAMINOPHEN 500 MG PO TABS: 1000.0000 mg | ORAL_TABLET | Freq: Four times a day (QID) | ORAL | 0 refills | Status: AC

## 2021-04-19 MED ORDER — SODIUM CHLORIDE 0.9 % IR SOLN
Status: DC | PRN
  Administered 2021-04-19: 1000 mL

## 2021-04-19 MED ORDER — KETOROLAC TROMETHAMINE 30 MG/ML IJ SOLN
INTRAMUSCULAR | Status: AC
  Filled 2021-04-19: qty 1

## 2021-04-19 MED ORDER — PROMETHAZINE HCL 25 MG/ML IJ SOLN
6.2500 mg | INTRAMUSCULAR | Status: DC | PRN
Start: 2021-04-19 — End: 2021-04-20

## 2021-04-19 MED ORDER — OXYCODONE HCL 5 MG PO TABS
ORAL_TABLET | ORAL | Status: AC
  Administered 2021-04-19: 5 mg via ORAL
  Filled 2021-04-19: qty 1

## 2021-04-19 MED ORDER — ROCURONIUM BROMIDE 10 MG/ML (PF) SYRINGE
PREFILLED_SYRINGE | INTRAVENOUS | Status: AC
  Filled 2021-04-19: qty 10

## 2021-04-19 MED ORDER — ONDANSETRON HCL 4 MG/2ML IJ SOLN
INTRAMUSCULAR | Status: DC | PRN
  Administered 2021-04-19: 4 mg via INTRAVENOUS

## 2021-04-19 MED ORDER — PHENYLEPHRINE HCL (PRESSORS) 10 MG/ML IV SOLN
INTRAVENOUS | Status: DC | PRN
  Administered 2021-04-19: 50 ug via INTRAVENOUS
  Administered 2021-04-19 (×2): 100 ug via INTRAVENOUS
  Administered 2021-04-19 (×2): 50 ug via INTRAVENOUS

## 2021-04-19 MED ORDER — PROPOFOL 10 MG/ML IV BOLUS
INTRAVENOUS | Status: AC
  Filled 2021-04-19: qty 20

## 2021-04-19 MED ORDER — SUGAMMADEX SODIUM 200 MG/2ML IV SOLN
INTRAVENOUS | Status: DC | PRN
  Administered 2021-04-19: 200 mg via INTRAVENOUS

## 2021-04-19 SURGICAL SUPPLY — 68 items
APPLICATOR ARISTA FLEXITIP XL (MISCELLANEOUS) IMPLANT
BAG URINE DRAIN 2000ML AR STRL (UROLOGICAL SUPPLIES) IMPLANT
BINDER ABDOMINAL  9 SM 30-45 (SOFTGOODS) ×1
BINDER ABDOMINAL 9 SM 30-45 (SOFTGOODS) ×2 IMPLANT
BLADE SURG SZ11 CARB STEEL (BLADE) ×3 IMPLANT
CANNULA CAP OBTURATR AIRSEAL 8 (CAP) ×3 IMPLANT
CATH FOLEY 2WAY  5CC 16FR (CATHETERS) ×1
CATH URTH 16FR FL 2W BLN LF (CATHETERS) ×2 IMPLANT
CHLORAPREP W/TINT 26 (MISCELLANEOUS) ×3 IMPLANT
COVER TIP SHEARS 8 DVNC (MISCELLANEOUS) ×2 IMPLANT
COVER TIP SHEARS 8MM DA VINCI (MISCELLANEOUS) ×1
DEFOGGER SCOPE WARMER CLEARIFY (MISCELLANEOUS) ×3 IMPLANT
DERMABOND ADVANCED (GAUZE/BANDAGES/DRESSINGS) ×1
DERMABOND ADVANCED .7 DNX12 (GAUZE/BANDAGES/DRESSINGS) ×2 IMPLANT
DRAPE 3/4 80X56 (DRAPES) ×6 IMPLANT
DRAPE ARM DVNC X/XI (DISPOSABLE) ×8 IMPLANT
DRAPE COLUMN DVNC XI (DISPOSABLE) ×2 IMPLANT
DRAPE DA VINCI XI ARM (DISPOSABLE) ×4
DRAPE DA VINCI XI COLUMN (DISPOSABLE) ×1
DRSG TELFA 4X3 1S NADH ST (GAUZE/BANDAGES/DRESSINGS) ×3 IMPLANT
ELECT REM PT RETURN 9FT ADLT (ELECTROSURGICAL) ×3
ELECTRODE REM PT RTRN 9FT ADLT (ELECTROSURGICAL) ×2 IMPLANT
GAUZE 4X4 16PLY ~~LOC~~+RFID DBL (SPONGE) ×6 IMPLANT
GLOVE SURG ENC MOIS LTX SZ7 (GLOVE) ×12 IMPLANT
GLOVE SURG UNDER LTX SZ7.5 (GLOVE) ×12 IMPLANT
GOWN STRL REUS W/ TWL LRG LVL3 (GOWN DISPOSABLE) ×16 IMPLANT
GOWN STRL REUS W/TWL LRG LVL3 (GOWN DISPOSABLE) ×8
GRASPER SUT TROCAR 14GX15 (MISCELLANEOUS) ×3 IMPLANT
GUIDEWIRE SENSOR ANG DUAL FLEX (WIRE) ×3 IMPLANT
HEMOSTAT ARISTA ABSORB 3G PWDR (HEMOSTASIS) IMPLANT
IRRIGATION STRYKERFLOW (MISCELLANEOUS) ×2 IMPLANT
IRRIGATOR STRYKERFLOW (MISCELLANEOUS) ×3
IV NS 1000ML (IV SOLUTION) ×1
IV NS 1000ML BAXH (IV SOLUTION) ×2 IMPLANT
KIT PINK PAD W/HEAD ARE REST (MISCELLANEOUS) ×3
KIT PINK PAD W/HEAD ARM REST (MISCELLANEOUS) ×2 IMPLANT
KIT TURNOVER CYSTO (KITS) ×3 IMPLANT
LABEL OR SOLS (LABEL) ×3 IMPLANT
MANIFOLD NEPTUNE II (INSTRUMENTS) ×3 IMPLANT
MANIPULATOR VCARE LG CRV RETR (MISCELLANEOUS) IMPLANT
MANIPULATOR VCARE SML CRV RETR (MISCELLANEOUS) IMPLANT
MANIPULATOR VCARE STD CRV RETR (MISCELLANEOUS) IMPLANT
NS IRRIG 1000ML POUR BTL (IV SOLUTION) IMPLANT
NS IRRIG 500ML POUR BTL (IV SOLUTION) ×3 IMPLANT
OBTURATOR OPTICAL STANDARD 8MM (TROCAR) ×1
OBTURATOR OPTICAL STND 8 DVNC (TROCAR) ×2
OBTURATOR OPTICALSTD 8 DVNC (TROCAR) ×2 IMPLANT
OCCLUDER COLPOPNEUMO (BALLOONS) ×3 IMPLANT
PACK GYN LAPAROSCOPIC (MISCELLANEOUS) ×3 IMPLANT
PAD OB MATERNITY 4.3X12.25 (PERSONAL CARE ITEMS) ×3 IMPLANT
PAD PREP 24X41 OB/GYN DISP (PERSONAL CARE ITEMS) ×3 IMPLANT
SCISSORS METZENBAUM CVD 33 (INSTRUMENTS) IMPLANT
SCRUB EXIDINE 4% CHG 4OZ (MISCELLANEOUS) ×3 IMPLANT
SEAL CANN UNIV 5-8 DVNC XI (MISCELLANEOUS) ×6 IMPLANT
SEAL XI 5MM-8MM UNIVERSAL (MISCELLANEOUS) ×3
SEALER VESSEL DA VINCI XI (MISCELLANEOUS) ×1
SEALER VESSEL EXT DVNC XI (MISCELLANEOUS) ×2 IMPLANT
SET CYSTO W/LG BORE CLAMP LF (SET/KITS/TRAYS/PACK) ×6 IMPLANT
SET TUBE FILTERED XL AIRSEAL (SET/KITS/TRAYS/PACK) ×3 IMPLANT
SOLUTION ELECTROLUBE (MISCELLANEOUS) ×3 IMPLANT
STRIP SUTURE WOUND CLOSURE 1/2 (MISCELLANEOUS) ×3 IMPLANT
SURGILUBE 2OZ TUBE FLIPTOP (MISCELLANEOUS) ×3 IMPLANT
SUT MNCRL 4-0 (SUTURE) ×1
SUT MNCRL 4-0 27XMFL (SUTURE) ×2
SUT VIC AB 0 CT2 27 (SUTURE) ×3 IMPLANT
SUT VLOC 90 2/L VL 12 GS22 (SUTURE) ×3 IMPLANT
SUTURE MNCRL 4-0 27XMF (SUTURE) ×2 IMPLANT
WATER STERILE IRR 500ML POUR (IV SOLUTION) IMPLANT

## 2021-04-19 NOTE — Anesthesia Postprocedure Evaluation (Signed)
Anesthesia Post Note  Patient: [name]  Procedure(s) Performed: XI ROBOT ASSISTED DIAGNOSTIC LAPAROSCOPY, EXCISION OF ENDOMETRIOSIS, LYSIS OF ADHESIONS CYSTOSCOPY WITH RETROGRADE PYELOGRAM (Left)  Patient location during evaluation: PACU Anesthesia Type: General Level of consciousness: awake and alert Pain management: pain level controlled Vital Signs Assessment: post-procedure vital signs reviewed and stable Respiratory status: spontaneous breathing, nonlabored ventilation, respiratory function stable and patient connected to nasal cannula oxygen Cardiovascular status: blood pressure returned to baseline and stable Postop Assessment: no apparent nausea or vomiting Anesthetic complications: no   No notable events documented.   Last Vitals:  Vitals:   04/19/21 1830 04/19/21 1845  BP: 104/64 111/70  Pulse: 74 78  Resp: 13 14  Temp: 36.7 C 36.9 C  SpO2: 96% 97%    Last Pain:  Vitals:   04/19/21 1845  TempSrc: Temporal  PainSc: 6                  Lenard Simmer

## 2021-04-19 NOTE — Anesthesia Preprocedure Evaluation (Addendum)
Anesthesia Evaluation  Patient identified by MRN, date of birth, ID band Patient awake    Reviewed: Allergy & Precautions, H&P , NPO status , Patient's Chart, lab work & pertinent test results  History of Anesthesia Complications Negative for: history of anesthetic complications  Airway Mallampati: II  TM Distance: >3 FB Neck ROM: full    Dental  (+) Teeth Intact   Pulmonary neg sleep apnea, neg COPD, former smoker,  Sinus infection, currently taking antibiotics   breath sounds clear to auscultation       Cardiovascular (-) angina(-) Past MI and (-) Cardiac Stents negative cardio ROS  (-) dysrhythmias  Rhythm:regular Rate:Normal     Neuro/Psych  Headaches, Anxiety negative psych ROS   GI/Hepatic Neg liver ROS, GERD  ,  Endo/Other  negative endocrine ROS  Renal/GU      Musculoskeletal   Abdominal   Peds  Hematology negative hematology ROS (+)   Anesthesia Other Findings Past Medical History: No date: Anemia No date: Anxiety 01/10/2015: Bladder infection, acute     Comment:  completed antibiotics and denies any s&s of infection               (01-30-15) No date: Dysrhythmia     Comment:  occ irregular heart beat per pt No date: GERD (gastroesophageal reflux disease)     Comment:  NO MEDS No date: Headache     Comment:  MIGRAINES DAILY  Past Surgical History: No date: ADENOIDECTOMY 02/01/2015: CHOLECYSTECTOMY; N/A     Comment:  Procedure: LAPAROSCOPIC CHOLECYSTECTOMY WITH               INTRAOPERATIVE CHOLANGIOGRAM;  Surgeon: Earline Mayotte, MD;  Location: [facility] ORS;  Service: General;                Laterality: N/A; No date: CHOLECYSTECTOMY No date: TONSILLECTOMY No date: WISDOM TOOTH EXTRACTION  BMI    Body Mass Index: 29.10 kg/m      Reproductive/Obstetrics negative OB ROS                            Anesthesia Physical Anesthesia Plan  ASA:  2  Anesthesia Plan: General ETT   Post-op Pain Management:    Induction:   PONV Risk Score and Plan: Ondansetron, Dexamethasone, Midazolam and Treatment Ohmann vary due to age or medical condition  Airway Management Planned: Oral ETT  Additional Equipment:   Intra-op Plan:   Post-operative Plan:   Informed Consent: I have reviewed the patients History and Physical, chart, labs and discussed the procedure including the risks, benefits and alternatives for the proposed anesthesia with the patient or authorized representative who has indicated his/her understanding and acceptance.     Dental Advisory Given  Plan Discussed with: Anesthesiologist, CRNA and Surgeon  Anesthesia Plan Comments: (Discussed increased risk of respiratory complication in setting of acute sinusitis.  Pt expressed understanding and desires to proceed.   Risks/benefits of anesthesia discussed and pt agrees to plan. K Strother Everitt)        Anesthesia Quick Evaluation

## 2021-04-19 NOTE — Interval H&P Note (Signed)
History and Physical Interval Note:  04/19/2021 2:17 PM  [name]  has presented today for surgery, with the diagnosis of chronic pelvic pain.  The various methods of treatment have been discussed with the patient and family. After consideration of risks, benefits and other options for treatment, the patient has consented to  Procedure(s): XI ROBOT ASSISTED DIAGNOSTIC LAPAROSCOPY, EXCISION OF ENDOMETRIOSIS, LYSIS OF ADHESIONS (N/A) as a surgical intervention.  The patient's history has been reviewed, patient examined, no change in status, stable for surgery.  I have reviewed the patient's chart and labs.  Questions were answered to the patient's satisfaction.     Christeen Douglas

## 2021-04-19 NOTE — Op Note (Addendum)
[facility] PROCEDURE DATE: 04/19/2021  PREOPERATIVE DIAGNOSIS: Chronic pelvic pain POSTOPERATIVE DIAGNOSIS: Endometriosis PROCEDURE:  - Robotically assisted operative laparoscopy - Excision of endometriosis; destruction of endometriosis - Lysis of adhesions - Cystoscopy with retrograde pyelogram by urology - Modifier 22 for difficulty and length of case, which took 2 hours for adhesiolysis and management of endometriosis implants.  SURGEON:  Dr. Christeen Douglas ASSISTANT: CST ANESTHESIOLOGIST: Lenard Simmer, MD Anesthesiologist: Lenard Simmer, MD; Karleen Hampshire, MD CRNA: Hezzie Bump, CRNA; Chelsea Aus, CRNA  INDICATIONS: 25 y.o. [phone] with history of chronic pelvic pain desiring surgical evaluation.   Please see preoperative notes for further details.  Risks of surgery were discussed with the patient including but not limited to: bleeding which Eckersley require transfusion or reoperation; infection which Manni require antibiotics; injury to bowel, bladder, ureters or other surrounding organs; need for additional procedures including laparotomy; thromboembolic phenomenon, incisional problems and other postoperative/anesthesia complications. Written informed consent was obtained.    FINDINGS:  Small uterus, normal ovaries and fallopian tubes bilaterally. Areas of endometriosis on the right ovary were noted and cauterized. Deep infiltrating dark endometriosis and endometriosis scarring in the left ovarian fossa, the right ovarian fossa, and significantly along the left and right ureteral sacral ligaments and in the posterior cul-de-sac.  There is a small area of white Endo in the left anterior pelvis lateral to the bladder. The bowel appeared clear, but the endo did infiltrate to just above the most distal portion of the intraperitoneal bowel.  Left sided endometriosis dissected out carefully and deeply to the level of the ureter.  The ureter was noted to be peristalsing  normally and significantly just below the level of my dissection. Stage 2-3 endometriosis Peritoneal biopsies were taken and sent to pathology. No other abdominal/pelvic abnormality.  Normal upper abdomen.  ANESTHESIA:    General INTRAVENOUS FLUIDS: 1500 ml ESTIMATED BLOOD LOSS: 20 ml URINE OUTPUT: 200 ml SPECIMENS: Peritoneal biopsies from multiple sites COMPLICATIONS: None immediate  PROCEDURE IN DETAIL:  The patient had sequential compression devices applied to her lower extremities while in the preoperative area.  She was then taken to the operating room where general anesthesia was administered and was found to be adequate.  She was placed in the dorsal lithotomy position, and was prepped and draped in a sterile manner.  A Foley catheter was inserted into her bladder and attached to constant drainage and a uterine manipulator was then advanced into the uterus.  After an adequate timeout was performed, attention was turned to the abdomen where an umbilical incision was made with the scalpel.  The 8-mm robotic trocar and sleeve were then advanced without difficulty with the laparoscope under direct visualization into the abdomen.  The abdomen was then insufflated with carbon dioxide gas and adequate pneumoperitoneum was obtained.   A detailed survey of the patient's pelvis and abdomen revealed the findings as mentioned above.  2 more robotic ports were placed laterally under direct visualization. The robot was docked in standard fashion.  Using sharp and cautery, these areas were carefully excised. Several areas were destroyed with electrocautery superficially above the bowel and lateral to the bladder to avoid wide excision in these areas.   The left ureter was carefully dissected out to excise in its entirety the left deeply infiltrating endometriosis.  The ureter was noted to be peristalsing throughout.  However, Dr. Pete Glatter with urology was asked to come in and evaluate the ureter after  the full dissection, as it was extensive.  Please see his note for the left retrograde pyelogram.  Excellent efflux was noted from both ureters during the cystoscopy.  The operative site was surveyed, and it was found to be hemostatic.  No intraoperative injury to surrounding organs was noted.  Pictures were taken of the quadrants and pelvis. The abdomen was desufflated and all instruments were then removed from the patient's abdomen. The uterine manipulator was removed without complications.  All incisions were closed with 4-0 Vicryl and Dermabond.   The patient tolerated the procedures well.  All instruments, needles, and sponge counts were correct x 2. The patient was taken to the recovery room in stable condition.

## 2021-04-19 NOTE — Anesthesia Procedure Notes (Signed)
Procedure Name: Intubation Date/Time: 04/19/2021 2:54 PM Performed by: Gayland Curry, CRNA Pre-anesthesia Checklist: Patient identified, Emergency Drugs available, Suction available and Patient being monitored Patient Re-evaluated:Patient Re-evaluated prior to induction Oxygen Delivery Method: Circle system utilized Preoxygenation: Pre-oxygenation with 100% oxygen Induction Type: IV induction Ventilation: Mask ventilation without difficulty Laryngoscope Size: Mac and 3 Grade View: Grade I Tube type: Oral Tube size: 6.5 mm Number of attempts: 1 Placement Confirmation: ETT inserted through vocal cords under direct vision, positive ETCO2 and breath sounds checked- equal and bilateral Secured at: 21 cm Tube secured with: Tape Dental Injury: Teeth and Oropharynx as per pre-operative assessment

## 2021-04-19 NOTE — OR Nursing (Signed)
Dr Dalbert Garnet requested urology consult  at 1600 after thermal excision of lesion on left ureterosacral ligament. Dr Alena Bills confirmed he was enroute at 74. Supplies for cystoscopy gathered and setup ready by 1635. Dr Alena Bills arrived to OR at 1655.

## 2021-04-19 NOTE — Transfer of Care (Signed)
Immediate Anesthesia Transfer of Care Note  Patient: [name]  Procedure(s) Performed: XI ROBOT ASSISTED DIAGNOSTIC LAPAROSCOPY, EXCISION OF ENDOMETRIOSIS, LYSIS OF ADHESIONS CYSTOSCOPY WITH RETROGRADE PYELOGRAM (Left)  Patient Location: PACU  Anesthesia Type:General  Level of Consciousness: awake, alert  and oriented  Airway & Oxygen Therapy: Patient Spontanous Breathing and Patient connected to face mask oxygen  Post-op Assessment: Report given to RN and Post -op Vital signs reviewed and stable  Post vital signs: Reviewed and stable  Last Vitals:  Vitals Value Taken Time  BP 129/95 04/19/21 1800  Temp 36.6 C 04/19/21 1754  Pulse 93 04/19/21 1805  Resp 20 04/19/21 1805  SpO2 95 % 04/19/21 1805  Vitals shown include unvalidated device data.  Last Pain:  Vitals:   04/19/21 1344  PainSc: 6       Patients Stated Pain Goal: 2 (04/19/21 1344)  Complications: No notable events documented.

## 2021-04-19 NOTE — Discharge Instructions (Addendum)
For the next three days, take ibuprofen and acetaminophen on a schedule, every 8 hours. You can take them together or you can intersperse them, and take one every four hours. I also gave you gabapentin for nighttime, to help you sleep and also to control pain. Take gabapentin medicines at night for at least the next 3 nights. You also have a narcotic, oxycodone, to take as needed if the above medicines don't help.  Postop constipation is a major cause of pain. Stay well hydrated, walk as you tolerate, and take over the counter senna as well as stool softeners if you need them.   Signs and Symptoms to Report Call our office at [phone] if you have any of the following.   Fever over 100.4 degrees or higher  Severe stomach pain not relieved with pain medications  Bright red bleeding that's heavier than a period that does not slow with rest  To go the bathroom a lot (frequency), you can't hold your urine (urgency), or it hurts when you empty your bladder (urinate)  Chest pain  Shortness of breath  Pain in the calves of your legs  Severe nausea and vomiting not relieved with anti-nausea medications  Signs of infection around your wounds, such as redness, hot to touch, swelling, green/yellow drainage (like pus), bad smelling discharge  Any concerns  What You Can Expect after Surgery  You Junker see some pink tinged, bloody fluid and bruising around the wound. This is normal.  You Tatum notice shoulder and neck pain. This is caused by the gas used during surgery to expand your abdomen so your surgeon could get to the uterus easier.  You Getchell have a sore throat because of the tube in your mouth during general anesthesia. This will go away in 2 to 3 days.  You Dayton have some stomach cramps.  You Manganello notice spotting on your panties.  You Atha have pain around the incision sites.   Activities after Your Discharge Follow these guidelines to help speed your recovery at home:  Do the coughing and deep  breathing as you did in the hospital for 2 weeks. Use the small blue breathing device, called the incentive spirometer for 2 weeks.  Don't drive if you are in pain or taking narcotic pain medicine. You Vandeven drive when you can safely slam on the brakes, turn the wheel forcefully, and rotate your torso comfortably. This is typically 1-2 weeks. Practice in a parking lot or side street prior to attempting to drive regularly.   Ask others to help with household chores for 4 weeks.  Do not lift anything heavier that 10 pounds for 4-6 weeks. This includes pets, children, and groceries.  Don't do strenuous activities, exercises, or sports like vacuuming, tennis, squash, etc. until your doctor says it is safe to do so. ---If you had a hysterectomy (abdominal, laparoscopic, or vaginal) do not have intercourse for 8-10 weeks.   Walk as you feel able. Rest often since it Madill take two or three weeks for your energy level to return to normal.   You Lindaman climb stairs  Avoid constipation:   -Eat fruits, vegetables, and whole grains. Eat small meals as your appetite will take time to return to normal.   -Drink 6 to 8 glasses of water each day unless your doctor has told you to limit your fluids.   -Use a laxative or stool softener as needed if constipation becomes a problem. You Daw take Miralax, metamucil, Citrucil, Colace, Senekot,  FiberCon, etc. If this does not relieve the constipation, try two tablespoons of Milk Of Magnesia every 8 hours until your bowels move.   You Schipani shower. Gently wash the wounds with a mild soap and water. Pat dry.  Do not get in a hot tub, swimming pool, etc. until your doctor agrees.  Do not use lotions, oils, powders on the wounds.  Do not douche, use tampons, or have sex until your doctor says it is okay.  Take your pain medicine when you need it. The medicine Goulart not work as well if the pain is bad.  Take the medicines you were taking before surgery. Other medications you will need  are pain medications (Norco or Percocet) and nausea medications (Zofran).    AMBULATORY SURGERY  DISCHARGE INSTRUCTIONS   The drugs that you were given will stay in your system until tomorrow so for the next 24 hours you should not:  Drive an automobile Make any legal decisions Drink any alcoholic beverage   You Coghill resume regular meals tomorrow.  Today it is better to start with liquids and gradually work up to solid foods.  You Brasel eat anything you prefer, but it is better to start with liquids, then soup and crackers, and gradually work up to solid foods.   Please notify your doctor immediately if you have any unusual bleeding, trouble breathing, redness and pain at the surgery site, drainage, fever, or pain not relieved by medication.    Additional Instructions:    Please contact your physician with any problems or Same Day Surgery at [phone], Monday through Friday 6 am to 4 pm, or [place] at [facility] number at [phone].

## 2021-04-19 NOTE — Op Note (Signed)
OPERATIVE NOTE   Patient Name: [name]   Date of Procedure: 04/19/21  Preoperative diagnosis:  Endometriosis Possible left ureteral injury  Postoperative diagnosis:  Endometriosis Normal left ureter  Procedure:  Cystoscopy, Left retrograde pyelogram  Attending: Milderd Meager, MD  Anesthesia: General  Estimated blood loss: 0 ml  Fluids: Per anesthesia record  Drains: None  Specimens: None  Antibiotics: Per Anesthesia Record  Findings: Normal urethra and bladder, clear efflux of urine from bilateral ureteral orifices, normal left retrograde pyelogram  Indications:  25 year old female with chronic pelvic pain and endometriosis seen in the operating room for evaluation of a possible left ureteral injury.  She is undergoing a robotic assisted laparoscopic excision of endometriosis and lysis of adhesions by Dr. Dalbert Garnet.  During dissection of the endometriosis in the left pelvis, there was concern about dissection close to the left ureter.  The left ureter was visualized and had apparent peristalsis.  No extravasation of urine was noted.  Urologic consultation was requested intraoperatively to evaluate for any possible ureteral injury.  Description of Procedure:  The patient was under anesthesia and in a exaggerated lithotomy position.  Dr. Dalbert Garnet had completed a robotic assisted laparoscopic excision of endometriosis and lysis of adhesions.  Laparoscopy demonstrated the left ureter with peristalsis and no obvious trauma or thermal injury.  There was dissection of tissue immediately around the left ureter.  Under sterile conditions, a 22 French rigid cystoscope was passed through the urethra and into the bladder.  The bladder was inspected throughout its entirety.  There was no evidence of any bladder injury or mucosal lesion.  Efflux of clear urine was seen from both ureters.  Using a 5 [place] open-ended catheter, contrast was injected into the left ureter.  A  retrograde pyelogram showed a patent left ureter without evidence of extravasation or obstruction.  The left collecting system was normal in appearance.  There was excellent drainage of contrast from the left kidney with removal of the ureteral catheter.  Given this normal appearance I did not feel that any additional intervention was indicated.  The bladder was drained and the cystoscope was removed.  The procedure was completed by Dr. Dalbert Garnet.  Complications: None  Condition: Stable, extubated, transferred to PACU  Plan: Per Dr. Dalbert Garnet

## 2021-04-20 ENCOUNTER — Encounter: Payer: Self-pay | Admitting: Obstetrics and Gynecology

## 2021-04-23 LAB — SURGICAL PATHOLOGY

## 2021-05-07 ENCOUNTER — Other Ambulatory Visit: Payer: Self-pay | Admitting: Obstetrics and Gynecology

## 2021-05-07 DIAGNOSIS — R1030 Lower abdominal pain, unspecified: Secondary | ICD-10-CM

## 2021-05-07 DIAGNOSIS — G8918 Other acute postprocedural pain: Secondary | ICD-10-CM

## 2021-05-08 ENCOUNTER — Ambulatory Visit: Payer: Medicaid Other

## 2021-05-09 ENCOUNTER — Ambulatory Visit: Payer: Medicaid Other | Attending: Obstetrics and Gynecology

## 2022-01-16 LAB — OB RESULTS CONSOLE RPR: RPR: NONREACTIVE

## 2022-01-16 LAB — OB RESULTS CONSOLE HEPATITIS B SURFACE ANTIGEN: Hepatitis B Surface Ag: NEGATIVE

## 2022-01-16 LAB — OB RESULTS CONSOLE VARICELLA ZOSTER ANTIBODY, IGG: Varicella: NON-IMMUNE/NOT IMMUNE

## 2022-01-16 LAB — OB RESULTS CONSOLE RUBELLA ANTIBODY, IGM: Rubella: IMMUNE

## 2022-01-29 ENCOUNTER — Emergency Department
Admission: EM | Admit: 2022-01-29 | Discharge: 2022-01-29 | Disposition: A | Discharge status: Home / Self Care | Payer: Medicaid Other | Attending: Emergency Medicine | Admitting: Emergency Medicine

## 2022-01-29 ENCOUNTER — Other Ambulatory Visit: Payer: Self-pay

## 2022-01-29 ENCOUNTER — Emergency Department: Payer: Medicaid Other

## 2022-01-29 ENCOUNTER — Encounter: Payer: Self-pay | Admitting: Emergency Medicine

## 2022-01-29 DIAGNOSIS — R1084 Generalized abdominal pain: Secondary | ICD-10-CM

## 2022-01-29 DIAGNOSIS — N390 Urinary tract infection, site not specified: Secondary | ICD-10-CM

## 2022-01-29 DIAGNOSIS — R519 Headache, unspecified: Secondary | ICD-10-CM

## 2022-01-29 DIAGNOSIS — J101 Influenza due to other identified influenza virus with other respiratory manifestations: Secondary | ICD-10-CM | POA: Insufficient documentation

## 2022-01-29 DIAGNOSIS — Z3A14 14 weeks gestation of pregnancy: Secondary | ICD-10-CM | POA: Insufficient documentation

## 2022-01-29 DIAGNOSIS — R0981 Nasal congestion: Secondary | ICD-10-CM

## 2022-01-29 DIAGNOSIS — B9689 Other specified bacterial agents as the cause of diseases classified elsewhere: Secondary | ICD-10-CM | POA: Diagnosis not present

## 2022-01-29 DIAGNOSIS — O2341 Unspecified infection of urinary tract in pregnancy, first trimester: Secondary | ICD-10-CM | POA: Diagnosis present

## 2022-01-29 DIAGNOSIS — Z20822 Contact with and (suspected) exposure to covid-19: Secondary | ICD-10-CM | POA: Diagnosis not present

## 2022-01-29 LAB — COMPREHENSIVE METABOLIC PANEL
ALT: 14 U/L (ref 0–44)
AST: 20 U/L (ref 15–41)
Albumin: 3.4 g/dL — ABNORMAL LOW (ref 3.5–5.0)
Alkaline Phosphatase: 56 U/L (ref 38–126)
Anion gap: 9 (ref 5–15)
BUN: 6 mg/dL (ref 6–20)
CO2: 24 mmol/L (ref 22–32)
Calcium: 8.5 mg/dL — ABNORMAL LOW (ref 8.9–10.3)
Chloride: 102 mmol/L (ref 98–111)
Creatinine, Ser: 0.63 mg/dL (ref 0.44–1.00)
GFR, Estimated: >60 mL/min (ref >60)
Glucose, Bld: 96 mg/dL (ref 70–99)
Potassium: 3.1 mmol/L — ABNORMAL LOW (ref 3.5–5.1)
Sodium: 135 mmol/L (ref 135–145)
Total Bilirubin: 0.5 mg/dL (ref 0.3–1.2)
Total Protein: 7.2 g/dL (ref 6.5–8.1)

## 2022-01-29 LAB — URINALYSIS, ROUTINE W REFLEX MICROSCOPIC
Bilirubin Urine: NEGATIVE
Glucose, UA: NEGATIVE mg/dL
Hgb urine dipstick: NEGATIVE
Ketones, ur: 80 mg/dL — AB
Nitrite: NEGATIVE
Protein, ur: 100 mg/dL — AB
Specific Gravity, Urine: 1.029 (ref 1.005–1.030)
WBC, UA: >50 WBC/hpf — ABNORMAL HIGH (ref 0–5)
pH: 5 (ref 5.0–8.0)

## 2022-01-29 LAB — CBC
HCT: 41.4 % (ref 36.0–46.0)
Hemoglobin: 13.4 g/dL (ref 12.0–15.0)
MCH: 27.6 pg (ref 26.0–34.0)
MCHC: 32.4 g/dL (ref 30.0–36.0)
MCV: 85.4 fL (ref 80.0–100.0)
Platelets: 145 10*3/uL — ABNORMAL LOW (ref 150–400)
RBC: 4.85 MIL/uL (ref 3.87–5.11)
RDW: 13.6 % (ref 11.5–15.5)
WBC: 8.1 10*3/uL (ref 4.0–10.5)
nRBC: 0 % (ref 0.0–0.2)

## 2022-01-29 LAB — LIPASE, BLOOD: Lipase: 35 U/L (ref 11–51)

## 2022-01-29 LAB — POC URINE PREG, ED: Preg Test, Ur: POSITIVE — AB

## 2022-01-29 LAB — RESP PANEL BY RT-PCR (FLU A&B, COVID) ARPGX2
Influenza A by PCR: NEGATIVE
Influenza B by PCR: POSITIVE — AB
SARS Coronavirus 2 by RT PCR: NEGATIVE

## 2022-01-29 MED ORDER — CEFDINIR 300 MG PO CAPS: 300.0000 mg | ORAL_CAPSULE | Freq: Two times a day (BID) | ORAL | 0 refills | Status: AC

## 2022-01-29 NOTE — ED Provider Notes (Signed)
[facility] Provider Note   Event Date/Time   First MD Initiated Contact with Patient 01/29/22 1356     (approximate) History  Abdominal Pain and Nasal Congestion  HPI [name] is a 26 y.o. female with a stated past medical history of 14-week pregnancy presents for generalized abdominal pain, headache, sinus congestion, subjective fever, and urinary frequency.  Patient states that she has known contact with flu at home and developed symptoms approximately 2 days after they were diagnosed.  Patient is concerned as she is [redacted] weeks pregnant that everything is okay with her pregnancy.  Patient denies any cramping suprapubic abdominal pain, vaginal bleeding ROS: Patient currently denies any vision changes, tinnitus, difficulty speaking, facial droop, sore throat, chest pain, shortness of breath, vomiting/diarrhea, dysuria, or weakness/numbness/paresthesias in any extremity   Physical Exam  Triage Vital Signs: ED Triage Vitals  Enc Vitals Group     BP 01/29/22 1253 106/74     Pulse Rate 01/29/22 1253 (!) 106     Resp 01/29/22 1253 18     Temp 01/29/22 1253 98.3 F (36.8 C)     Temp src --      SpO2 01/29/22 1253 97 %     Weight 01/29/22 1255 148 lb (67.1 kg)     Height 01/29/22 1255 5\' 1"  (1.549 m)     Head Circumference --      Peak Flow --      Pain Score 01/29/22 1255 10     Pain Loc --      Pain Edu? --      Excl. in GC? --    Most recent vital signs: Vitals:   01/29/22 1253 01/29/22 1819  BP: 106/74 111/74  Pulse: (!) 106 (!) 121  Resp: 18 18  Temp: 98.3 F (36.8 C) 99.6 F (37.6 C)  SpO2: 97% 97%   General: Awake, oriented x4. CV:  Good peripheral perfusion.  Resp:  Normal effort.  Abd:  No distention.  Generalized mild tenderness to palpation Other:  Young adult Caucasian female laying in bed in no acute distress ED Results / Procedures / Treatments  Labs (all labs ordered are listed, but only abnormal results are displayed) Labs  Reviewed  RESP PANEL BY RT-PCR (FLU A&B, COVID) ARPGX2 - Abnormal; Notable for the following components:      Result Value   Influenza B by PCR POSITIVE (*)    All other components within normal limits  COMPREHENSIVE METABOLIC PANEL - Abnormal; Notable for the following components:   Potassium 3.1 (*)    Calcium 8.5 (*)    Albumin 3.4 (*)    All other components within normal limits  CBC - Abnormal; Notable for the following components:   Platelets 145 (*)    All other components within normal limits  URINALYSIS, ROUTINE W REFLEX MICROSCOPIC - Abnormal; Notable for the following components:   Color, Urine AMBER (*)    APPearance TURBID (*)    Ketones, ur 80 (*)    Protein, ur 100 (*)    Leukocytes,Ua MODERATE (*)    WBC, UA >50 (*)    Bacteria, UA MANY (*)    All other components within normal limits  POC URINE PREG, ED - Abnormal; Notable for the following components:   Preg Test, Ur POSITIVE (*)    All other components within normal limits  LIPASE, BLOOD  PROCEDURES: Critical Care performed: No .1-3 Lead EKG Interpretation  Performed by: 01/31/22, MD  Authorized by: Merwyn Katos, MD     Interpretation: abnormal     ECG rate:  111   ECG rate assessment: tachycardic     Rhythm: sinus tachycardia     Ectopy: none     Conduction: normal    MEDICATIONS ORDERED IN ED: Medications - No data to display IMPRESSION / MDM / ASSESSMENT AND PLAN / ED COURSE  I reviewed the triage vital signs and the nursing notes.                             Differential diagnosis for this patient includes but is not limited to: Preterm labor, placental abruption, influenza infection, appendicitis, colitis, sinus infection The patient is on the cardiac monitor to evaluate for evidence of arrhythmia and/or significant heart rate changes. Patient's presentation is most consistent with acute presentation with potential threat to life or bodily function. Patient is a 26 year old female with  the above-stated past medical history including 14 weeks pregnancy who presents for generalized abdominal pain in the setting of recent sick contact with influenza.  Patient also shows signs and symptoms of influenza infection.  PCR is positive for influenza type B.  Patient's urinalysis also positive for leukocytes and bacteria concerning for urinary tract infection.  Given that patient has generalized abdominal pain, will cover broadly with cefdinir for possible ascending infection.  The patient has been reexamined and is ready to be discharged.  All diagnostic results have been reviewed and discussed with the patient/family.  Care plan has been outlined and the patient/family understands all current diagnoses, results, and treatment plans.  There are no new complaints, changes, or physical findings at this time.  All questions have been addressed and answered.  All medications, if any, that were given while in the emergency department or any that are being prescribed have been reviewed with the patient/family.  All side effects and adverse reactions have been explained.  Patient was instructed to, and agrees to follow-up with their primary care physician as well as return to the emergency department if any new or worsening symptoms develop.  Discharge home with OB/GYN follow-up   FINAL CLINICAL IMPRESSION(S) / ED DIAGNOSES   Final diagnoses:  Generalized abdominal pain  Influenza due to influenza virus, type B  Urinary tract infection without hematuria, site unspecified  Sinus congestion  Acute nonintractable headache, unspecified headache type   Rx / DC Orders   ED Discharge Orders          Ordered    cefdinir (OMNICEF) 300 MG capsule  2 times daily        01/29/22 1812           Note:  This document was prepared using Dragon voice recognition software and [place]ne[place] include unintentional dictation errors.   Merwyn Katos, MD 01/29/22 [phone]

## 2022-01-29 NOTE — ED Triage Notes (Signed)
Patient states her son was diagnosed with flu last week. Since Sunday started having congestion, headaches, stabbing abdominal pain and fevers. Patient is [redacted] weeks pregnant.

## 2022-06-07 ENCOUNTER — Other Ambulatory Visit: Payer: Self-pay

## 2022-06-07 ENCOUNTER — Encounter: Payer: Self-pay | Admitting: *Deleted

## 2022-06-07 ENCOUNTER — Observation Stay: Payer: Medicaid Other

## 2022-06-07 ENCOUNTER — Observation Stay
Admission: EM | Admit: 2022-06-07 | Discharge: 2022-06-07 | Disposition: A | Discharge status: Home / Self Care | Payer: Medicaid Other | Attending: Certified Nurse Midwife | Admitting: Certified Nurse Midwife

## 2022-06-07 DIAGNOSIS — O26893 Other specified pregnancy related conditions, third trimester: Secondary | ICD-10-CM | POA: Diagnosis not present

## 2022-06-07 DIAGNOSIS — Z79899 Other long term (current) drug therapy: Secondary | ICD-10-CM | POA: Insufficient documentation

## 2022-06-07 DIAGNOSIS — R1011 Right upper quadrant pain: Secondary | ICD-10-CM | POA: Insufficient documentation

## 2022-06-07 DIAGNOSIS — O99891 Other specified diseases and conditions complicating pregnancy: Secondary | ICD-10-CM | POA: Insufficient documentation

## 2022-06-07 DIAGNOSIS — Z87891 Personal history of nicotine dependence: Secondary | ICD-10-CM | POA: Insufficient documentation

## 2022-06-07 DIAGNOSIS — R519 Headache, unspecified: Secondary | ICD-10-CM | POA: Diagnosis not present

## 2022-06-07 DIAGNOSIS — Z3A32 32 weeks gestation of pregnancy: Secondary | ICD-10-CM | POA: Insufficient documentation

## 2022-06-07 LAB — COMPREHENSIVE METABOLIC PANEL
ALT: 10 U/L (ref 0–44)
AST: 18 U/L (ref 15–41)
Albumin: 2.7 g/dL — ABNORMAL LOW (ref 3.5–5.0)
Alkaline Phosphatase: 73 U/L (ref 38–126)
Anion gap: 3 — ABNORMAL LOW (ref 5–15)
BUN: 8 mg/dL (ref 6–20)
CO2: 24 mmol/L (ref 22–32)
Calcium: 8.7 mg/dL — ABNORMAL LOW (ref 8.9–10.3)
Chloride: 109 mmol/L (ref 98–111)
Creatinine, Ser: 0.62 mg/dL (ref 0.44–1.00)
GFR, Estimated: >60 mL/min (ref >60)
Glucose, Bld: 75 mg/dL (ref 70–99)
Potassium: 4.2 mmol/L (ref 3.5–5.1)
Sodium: 136 mmol/L (ref 135–145)
Total Bilirubin: 0.5 mg/dL (ref 0.3–1.2)
Total Protein: 6.2 g/dL — ABNORMAL LOW (ref 6.5–8.1)

## 2022-06-07 LAB — AMYLASE: Amylase: 91 U/L (ref 28–100)

## 2022-06-07 LAB — LIPASE, BLOOD: Lipase: 46 U/L (ref 11–51)

## 2022-06-07 MED ORDER — ACETAMINOPHEN 325 MG PO TABS
650.0000 mg | ORAL_TABLET | ORAL | Status: DC | PRN
  Administered 2022-06-07: 650 mg via ORAL
  Filled 2022-06-07: qty 2

## 2022-06-07 MED ORDER — DOCUSATE SODIUM 100 MG PO CAPS: 100.0000 mg | ORAL_CAPSULE | Freq: Every day | ORAL | Status: DC

## 2022-06-07 MED ORDER — PRENATAL MULTIVITAMIN CH: 1.0000 | ORAL_TABLET | Freq: Every day | ORAL | Status: DC

## 2022-06-07 MED ORDER — CALCIUM CARBONATE ANTACID 500 MG PO CHEW: 2.0000 | CHEWABLE_TABLET | ORAL | Status: DC | PRN

## 2022-06-07 NOTE — OB Triage Note (Signed)
Discharge home. Left floor ambulatory with husband. Ritik Stavola S  

## 2022-06-07 NOTE — Discharge Summary (Signed)
[name] is a 26 y.o. female. She is at [redacted]w[redacted]d gestation. No LMP recorded. Patient is pregnant. Estimated Date of Delivery: 07/27/22  Prenatal care site: [facility]   Current pregnancy complicated by:  - Rh negative - GBS positive at NOB visit - POTS - Varicella non-immune  Chief complaint: RUQ pain  She reports RUQ pain that has now caused her to have a headache. She states the RUQ pain has been present for several weeks but is getting worse today. She reports nausea but denies vomiting. States the pain wraps around into her back. She had her gallbladder removed in 2016.  She was seen in the office 05/29/22 for these same complaints and flu/covid swab was negative, CMP and P/C ratio were normal.   She also states her heart rate has been high and she has felt dizzy when she sits up. She has a history of POTS and has an upcoming appointment with cardiology on 06/27/22.  S: Resting comfortably. no CTX, no VB.no LOF,  Active fetal movement. Denies: HA, visual changes, SOB.  Maternal Medical History:   Past Medical History:  Diagnosis Date   Anemia    Anxiety    Bladder infection, acute 01/10/2015   completed antibiotics and denies any s&s of infection (01-30-15)   Dysrhythmia    occ irregular heart beat per pt   GERD (gastroesophageal reflux disease)    NO MEDS   Headache    MIGRAINES DAILY    Past Surgical History:  Procedure Laterality Date   ADENOIDECTOMY     CHOLECYSTECTOMY N/A 02/01/2015   Procedure: LAPAROSCOPIC CHOLECYSTECTOMY WITH INTRAOPERATIVE CHOLANGIOGRAM;  Surgeon: Earline Mayotte, MD;  Location: [facility] ORS;  Service: General;  Laterality: N/A;   CHOLECYSTECTOMY     CYSTOSCOPY W/ RETROGRADES Left 04/19/2021   Procedure: CYSTOSCOPY WITH RETROGRADE PYELOGRAM;  Surgeon: Christeen Douglas, MD;  Location: [facility] ORS;  Service: Gynecology;  Laterality: Left;   TONSILLECTOMY     WISDOM TOOTH EXTRACTION     XI ROBOT ASSISTED DIAGNOSTIC LAPAROSCOPY N/A  04/19/2021   Procedure: XI ROBOT ASSISTED DIAGNOSTIC LAPAROSCOPY, EXCISION OF ENDOMETRIOSIS, LYSIS OF ADHESIONS;  Surgeon: Christeen Douglas, MD;  Location: [facility] ORS;  Service: Gynecology;  Laterality: N/A;    Allergies  Allergen Reactions   Amoxicillin Anaphylaxis   Penicillin G    Penicillins Anaphylaxis and Swelling    Has patient had a PCN reaction causing immediate rash, facial/tongue/throat swelling, SOB or lightheadedness with hypotension: Yes Has patient had a PCN reaction causing severe rash involving mucus membranes or skin necrosis: No Has patient had a PCN reaction that required hospitalization No Has patient had a PCN reaction occurring within the last 10 years: Yes If all of the above answers are "NO", then [place] proceed with Cephalosporin use.    Prior to Admission medications   Medication Sig Start Date End Date Taking? Authorizing Provider  acetaminophen (TYLENOL) 500 MG tablet Take 2 tablets (1,000 mg total) by mouth every 6 (six) hours as needed for mild pain or moderate pain. Patient taking differently: Take 500-1,000 mg by mouth every 6 (six) hours as needed for mild pain or moderate pain. 06/19/19  Yes Genia Del, CNM  Doxylamine-Pyridoxine (DICLEGIS PO) Take 10 mg by mouth 2 (two) times daily as needed (nausea/vomiting).   Yes [provider]  aspirin-acetaminophen-caffeine (EXCEDRIN MIGRAINE) [id number] MG tablet Take 1 tablet by mouth every 6 (six) hours as needed for headache or migraine. Patient not taking: Reported on 06/07/2022    [provider]  docusate calcium (SURFAK) 240 MG capsule Take 1 capsule (240 mg total) by mouth daily. Patient not taking: Reported on 06/07/2022 04/19/21   Christeen Douglas, MD  doxycycline (VIBRAMYCIN) 100 MG capsule Take 1 capsule (100 mg total) by mouth 2 (two) times daily. Patient not taking: Reported on 06/07/2022 04/13/21   Becky Augusta, NP  fluticasone [facility]) 50 MCG/ACT nasal spray Place 1 spray into  both nostrils daily as needed for rhinitis. Patient not taking: Reported on 06/07/2022    [provider]  gabapentin (NEURONTIN) 800 MG tablet Take 1 tablet (800 mg total) by mouth at bedtime for 14 days. Take nightly for 3 days, then up to 14 days as needed 04/19/21 05/03/21  Christeen Douglas, MD  ibuprofen (ADVIL) 200 MG tablet Take 400 mg by mouth every 6 (six) hours as needed for mild pain or moderate pain. Patient not taking: Reported on 06/07/2022    [provider]  oxyCODONE (OXY IR/ROXICODONE) 5 MG immediate release tablet Take 1 tablet (5 mg total) by mouth every 4 (four) hours as needed for severe pain. Patient not taking: Reported on 06/07/2022 04/19/21   Christeen Douglas, MD      Social History: She  reports that she quit smoking about 4 years ago. Her smoking use included cigarettes. She has a 4.00 pack-year smoking history. She has never used smokeless tobacco. She reports that she does not currently use alcohol. She reports that she does not use drugs.  Family History: family history includes Arrhythmia in her father; CAD in her paternal grandfather and paternal grandmother; Cancer in her maternal aunt; Healthy in her mother; Hyperlipidemia in her father; Hypertension in her father.  no history of gyn cancers  Review of Systems: A full review of systems was performed and negative except as noted in the HPI.    O:  BP 118/81 (BP Location: Right Arm)   Pulse 81   Temp (!) 97.5 F (36.4 C) (Oral)   Resp 18   Ht 5\' 1"  (1.549 m)   Wt 74.8 kg   BMI 31.18 kg/m  Results for orders placed or performed during the hospital encounter of 06/07/22 (from the past 48 hour(s))  Comprehensive metabolic panel   Collection Time: 06/07/22  2:14 PM  Result Value Ref Range   Sodium 136 135 - 145 mmol/L   Potassium 4.2 3.5 - 5.1 mmol/L   Chloride 109 98 - 111 mmol/L   CO2 24 22 - 32 mmol/L   Glucose, Bld 75 70 - 99 mg/dL   BUN 8 6 - 20 mg/dL   Creatinine, Ser 06/09/22 0.44  - 1.00 mg/dL   Calcium 8.7 (L) 8.9 - 10.3 mg/dL   Total Protein 6.2 (L) 6.5 - 8.1 g/dL   Albumin 2.7 (L) 3.5 - 5.0 g/dL   AST 18 15 - 41 U/L   ALT 10 0 - 44 U/L   Alkaline Phosphatase 73 38 - 126 U/L   Total Bilirubin 0.5 0.3 - 1.2 mg/dL   GFR, Estimated 1.82 >99 mL/min   Anion gap 3 (L) 5 - 15  Amylase   Collection Time: 06/07/22  2:14 PM  Result Value Ref Range   Amylase 91 28 - 100 U/L  Lipase, blood   Collection Time: 06/07/22  2:14 PM  Result Value Ref Range   Lipase 46 11 - 51 U/L     CLINICAL DATA:  Right upper quadrant abdominal pain radiating to the back x1 week. Thirty-three weeks pregnant. History  of cholecystectomy in 2016.   EXAM: ULTRASOUND ABDOMEN LIMITED RIGHT UPPER QUADRANT   COMPARISON:  Ultrasound January 19, 2015.   FINDINGS: Gallbladder:   Gallbladder surgically absent.   Common bile duct:   Diameter: 6.6 mm   Liver:   No focal lesion identified. Within normal limits in parenchymal echogenicity. No subcapsular/perihepatic hematoma identified. Portal vein is patent on color Doppler imaging with normal direction of blood flow towards the liver.   Other: None.   IMPRESSION: Gallbladder is surgically absent with mild prominence of the common bile duct measuring 7 mm, within normal limits for reservoir effect post cholecystectomy. However, would correlate with laboratory values for biliary obstruction.     Electronically Signed   By: Maudry Mayhew M.D.   On: 06/07/2022 15:51  Constitutional: NAD, AAOx3  HE/ENT: extraocular movements grossly intact, moist mucous membranes CV: RRR PULM: nl respiratory effort, CTABL     Abd: gravid, non-tender, non-distended, soft      Ext: Non-tender, Nonedematous   Psych: mood appropriate, speech normal Pelvic: deferred  Fetal  monitoring: Cat 1 Appropriate for GA Baseline: 135bpm Variability: moderate Accelerations: present x >2 Decelerations absent Time  A/P: 25 y.o. [redacted]w[redacted]d here for  antenatal surveillance for RUQ pain  Principle Diagnosis:  RUQ pain in pregnancy  Labor: not present.  Fetal Wellbeing: Reassuring Cat 1 tracing. RUQ pain: [place], CMP, lipase, and amylase normal. Her pain improved with 1000mg  Tylenol. D/c home stable, precautions reviewed, follow-up as scheduled.    , CNM 06/07/2022 5:28 PM

## 2022-06-09 HISTORY — PX: TUBAL LIGATION: SHX77

## 2022-06-09 NOTE — L&D Delivery Note (Signed)
Delivery Note  [name] is a [id number] at [redacted]w[redacted]d Patient's last menstrual period was 10/20/2021 (exact date)., consistent with [place] 874w4dEstimated Date of Delivery: 07/27/22   First Stage: Labor onset: 0900 Induction: misoprostol and oxytocin Analgesia /Anesthesia intrapartum: Epidural SROM at 1400 GBS: positive IP Antibiotics: clindamycin x 4  Second Stage: Complete dilation at 1745 Onset of pushing at 1745 FHR second stage 120 bpm with moderate, variable decels with pushing   [name] presented to L&D for scheduled elective IOL at term and worsening POTS symptoms. Misoprostol was used for cervical ripening followed by oxytocin for induction. She progressed to C/C/+2 with an urge to push.  She pushed effectively over approximately 10 minutes for a spontaneous vaginal birth.  Delivery of a viable baby girl on 07/21/2022 at 1733y CNM Delivery of fetal head in OA position with restitution to ROT. No nuchal cord;  Anterior then posterior shoulders delivered easily with gentle downward traction. Compound right hand easily reduced. Baby placed on mom's chest, and attended to by baby RN Cord double clamped after cessation of pulsation, cut by father of baby.  Cord blood sample collection: Yes B NEG   Third Stage: Oxytocin bolus started after delivery of infant for hemorrhage prophylaxis  Placenta delivered via Duncan mechanism intact with 3 VC @ 1759 Placenta disposition: discarded  Uterine tone firm / bleeding moderate   1st vaginal laceration identified - hemostatic, approximates well Anesthesia for repair: N/A Repair not indicated  Est. Blood Loss (mL): [id number]  Complications: None  Mom to postpartum.  Baby to Couplet care / Skin to Skin.  Newborn: Information for the patient's newborn:  [name], Girl [name] [[id number] born female  "Emerlyn" Birth Weight:  Pending  APGAR: 8, 9  Newborn Delivery   Birth date/time: 07/21/2022 17:53:00 Delivery type: Vaginal,  Spontaneous       Feeding planned: breast feeding  ---------- AnDrinda ButtsCNM Certified Nurse Midwife [facility]

## 2022-07-08 LAB — OB RESULTS CONSOLE HIV ANTIBODY (ROUTINE TESTING): HIV: NONREACTIVE

## 2022-07-18 ENCOUNTER — Other Ambulatory Visit: Payer: Self-pay

## 2022-07-18 DIAGNOSIS — Z6791 Unspecified blood type, Rh negative: Secondary | ICD-10-CM | POA: Insufficient documentation

## 2022-07-18 DIAGNOSIS — B951 Streptococcus, group B, as the cause of diseases classified elsewhere: Secondary | ICD-10-CM | POA: Insufficient documentation

## 2022-07-18 DIAGNOSIS — O26893 Other specified pregnancy related conditions, third trimester: Secondary | ICD-10-CM | POA: Insufficient documentation

## 2022-07-18 DIAGNOSIS — G90A Postural orthostatic tachycardia syndrome (POTS): Secondary | ICD-10-CM | POA: Insufficient documentation

## 2022-07-18 DIAGNOSIS — O09893 Supervision of other high risk pregnancies, third trimester: Secondary | ICD-10-CM | POA: Insufficient documentation

## 2022-07-18 DIAGNOSIS — R7309 Other abnormal glucose: Secondary | ICD-10-CM

## 2022-07-18 DIAGNOSIS — Z3490 Encounter for supervision of normal pregnancy, unspecified, unspecified trimester: Secondary | ICD-10-CM

## 2022-07-18 NOTE — Progress Notes (Signed)
[id number] at [redacted]w[redacted]d Patient's last menstrual period was 10/20/2021 (exact date)., c/w early [place] [id number] 12/19/2021.  Scheduled for induction of labor for elective at term, worsening POTS symptoms on 07/21/2022 at 0001.   [facility] P[facility] complicated by: 1. Encounter for elective induction of labor   2. POTS (postural orthostatic tachycardia syndrome)   3. Positive GBS test   4. Susceptible to Varicella (non-immune), currently pregnant in third trimester   5. Rh negative status during pregnancy in third trimester     Prenatal Labs: Blood type/Rh B NEG  Antibody screen neg  Rubella Immune    Varicella Non-Immune  RPR NR  HBsAg NR  Hep C NR  HIV NR  GC neg  Chlamydia neg  Genetic screening AFP negative   1 hour GTT 167  3 hour GTT 81, 137, 132, 77  GBS POS - susceptible to Clindamycin   Flu:  declined  Tdap:  declined  RSV:  declined  Contraception: Tubal Ligation - consent signed 05/09/2022 [facility]  ____ AnDrinda ButtsCNM Certified [facility]

## 2022-07-21 ENCOUNTER — Encounter: Payer: Self-pay | Admitting: Obstetrics and Gynecology

## 2022-07-21 ENCOUNTER — Inpatient Hospital Stay
Admission: EM | Admit: 2022-07-21 | Discharge: 2022-07-23 | DRG: 807 | Disposition: A | Discharge status: Home / Self Care | Payer: Medicaid Other | Attending: Obstetrics | Admitting: Obstetrics

## 2022-07-21 ENCOUNTER — Other Ambulatory Visit: Payer: Self-pay

## 2022-07-21 ENCOUNTER — Inpatient Hospital Stay: Payer: Medicaid Other | Admitting: Anesthesiology

## 2022-07-21 DIAGNOSIS — Z6791 Unspecified blood type, Rh negative: Secondary | ICD-10-CM

## 2022-07-21 DIAGNOSIS — Z3490 Encounter for supervision of normal pregnancy, unspecified, unspecified trimester: Secondary | ICD-10-CM | POA: Diagnosis present

## 2022-07-21 DIAGNOSIS — O9902 Anemia complicating childbirth: Secondary | ICD-10-CM | POA: Diagnosis present

## 2022-07-21 DIAGNOSIS — Z880 Allergy status to penicillin: Secondary | ICD-10-CM | POA: Diagnosis not present

## 2022-07-21 DIAGNOSIS — G90A Postural orthostatic tachycardia syndrome (POTS): Secondary | ICD-10-CM | POA: Diagnosis present

## 2022-07-21 DIAGNOSIS — Z3A39 39 weeks gestation of pregnancy: Secondary | ICD-10-CM

## 2022-07-21 DIAGNOSIS — O99892 Other specified diseases and conditions complicating childbirth: Secondary | ICD-10-CM | POA: Diagnosis present

## 2022-07-21 DIAGNOSIS — Z87891 Personal history of nicotine dependence: Secondary | ICD-10-CM

## 2022-07-21 DIAGNOSIS — B951 Streptococcus, group B, as the cause of diseases classified elsewhere: Secondary | ICD-10-CM

## 2022-07-21 DIAGNOSIS — O26893 Other specified pregnancy related conditions, third trimester: Secondary | ICD-10-CM | POA: Diagnosis present

## 2022-07-21 DIAGNOSIS — O99824 Streptococcus B carrier state complicating childbirth: Secondary | ICD-10-CM | POA: Diagnosis present

## 2022-07-21 DIAGNOSIS — Z2839 Other underimmunization status: Secondary | ICD-10-CM

## 2022-07-21 LAB — CBC
HCT: 33.5 % — ABNORMAL LOW (ref 36.0–46.0)
Hemoglobin: 10.7 g/dL — ABNORMAL LOW (ref 12.0–15.0)
MCH: 26.8 pg (ref 26.0–34.0)
MCHC: 31.9 g/dL (ref 30.0–36.0)
MCV: 83.8 fL (ref 80.0–100.0)
Platelets: 226 10*3/uL (ref 150–400)
RBC: 4 MIL/uL (ref 3.87–5.11)
RDW: 14.6 % (ref 11.5–15.5)
WBC: 12.5 10*3/uL — ABNORMAL HIGH (ref 4.0–10.5)
nRBC: 0 % (ref 0.0–0.2)

## 2022-07-21 LAB — TYPE AND SCREEN
ABO/RH(D): B NEG
Antibody Screen: POSITIVE

## 2022-07-21 LAB — RPR: RPR Ser Ql: NONREACTIVE

## 2022-07-21 MED ORDER — BENZOCAINE-MENTHOL 20-0.5 % EX AERO
1.0000 | INHALATION_SPRAY | CUTANEOUS | Status: DC | PRN
  Filled 2022-07-21: qty 56

## 2022-07-21 MED ORDER — SODIUM CHLORIDE 0.9 % IV SOLN: 250.0000 mL | INTRAVENOUS | Status: DC | PRN

## 2022-07-21 MED ORDER — EPHEDRINE 5 MG/ML INJ
10.0000 mg | INTRAVENOUS | Status: DC | PRN
  Administered 2022-07-21: 10 mg via INTRAVENOUS

## 2022-07-21 MED ORDER — ONDANSETRON HCL 4 MG/2ML IJ SOLN: 4.0000 mg | INTRAMUSCULAR | Status: DC | PRN

## 2022-07-21 MED ORDER — LIDOCAINE-EPINEPHRINE (PF) 1.5 %-1:200000 IJ SOLN
INTRAMUSCULAR | Status: DC | PRN
  Administered 2022-07-21: 3 mL via PERINEURAL

## 2022-07-21 MED ORDER — PHENYLEPHRINE 80 MCG/ML (10ML) SYRINGE FOR IV PUSH (FOR BLOOD PRESSURE SUPPORT): 80.0000 ug | PREFILLED_SYRINGE | INTRAVENOUS | Status: DC | PRN

## 2022-07-21 MED ORDER — FAMOTIDINE 20 MG PO TABS: 20.0000 mg | ORAL_TABLET | Freq: Once | ORAL | Status: DC

## 2022-07-21 MED ORDER — OXYTOCIN BOLUS FROM INFUSION
333.0000 mL | Freq: Once | INTRAVENOUS | Status: AC
  Administered 2022-07-21: 333 mL via INTRAVENOUS

## 2022-07-21 MED ORDER — PRENATAL MULTIVITAMIN CH
1.0000 | ORAL_TABLET | Freq: Every day | ORAL | Status: DC
  Filled 2022-07-21: qty 1

## 2022-07-21 MED ORDER — SODIUM CHLORIDE 0.9% FLUSH: 3.0000 mL | INTRAVENOUS | Status: DC | PRN

## 2022-07-21 MED ORDER — SODIUM CHLORIDE 0.9% FLUSH: 3.0000 mL | Freq: Two times a day (BID) | INTRAVENOUS | Status: DC

## 2022-07-21 MED ORDER — TERBUTALINE SULFATE 1 MG/ML IJ SOLN: 0.2500 mg | Freq: Once | INTRAMUSCULAR | Status: DC | PRN

## 2022-07-21 MED ORDER — LACTATED RINGERS IV SOLN
500.0000 mL | Freq: Once | INTRAVENOUS | Status: AC
  Administered 2022-07-21: 500 mL via INTRAVENOUS

## 2022-07-21 MED ORDER — OXYTOCIN-SODIUM CHLORIDE 30-0.9 UT/500ML-% IV SOLN
1.0000 m[IU]/min | INTRAVENOUS | Status: DC
  Administered 2022-07-21: 2 m[IU]/min via INTRAVENOUS

## 2022-07-21 MED ORDER — VARICELLA VIRUS VACCINE LIVE 1350 PFU/0.5ML IJ SUSR: 0.5000 mL | INTRAMUSCULAR | Status: DC | PRN

## 2022-07-21 MED ORDER — LIDOCAINE HCL (PF) 1 % IJ SOLN
INTRAMUSCULAR | Status: DC | PRN
  Administered 2022-07-21: 3 mL

## 2022-07-21 MED ORDER — SIMETHICONE 80 MG PO CHEW: 80.0000 mg | CHEWABLE_TABLET | ORAL | Status: DC | PRN

## 2022-07-21 MED ORDER — CALCIUM CARBONATE ANTACID 500 MG PO CHEW
2.0000 | CHEWABLE_TABLET | Freq: Once | ORAL | Status: AC | PRN
  Administered 2022-07-21: 400 mg via ORAL
  Filled 2022-07-21: qty 2

## 2022-07-21 MED ORDER — AMMONIA AROMATIC IN INHA
RESPIRATORY_TRACT | Status: AC
  Filled 2022-07-21: qty 10

## 2022-07-21 MED ORDER — LACTATED RINGERS IV SOLN: INTRAVENOUS | Status: DC

## 2022-07-21 MED ORDER — COCONUT OIL OIL: 1.0000 | TOPICAL_OIL | Status: DC | PRN

## 2022-07-21 MED ORDER — LACTATED RINGERS IV SOLN: 500.0000 mL | INTRAVENOUS | Status: DC | PRN

## 2022-07-21 MED ORDER — CLINDAMYCIN PHOSPHATE 900 MG/50ML IV SOLN
900.0000 mg | Freq: Three times a day (TID) | INTRAVENOUS | Status: DC
  Administered 2022-07-21 (×3): 900 mg via INTRAVENOUS
  Filled 2022-07-21 (×3): qty 50

## 2022-07-21 MED ORDER — ZOLPIDEM TARTRATE 5 MG PO TABS: 5.0000 mg | ORAL_TABLET | Freq: Every evening | ORAL | Status: DC | PRN

## 2022-07-21 MED ORDER — FAMOTIDINE 20 MG PO TABS
20.0000 mg | ORAL_TABLET | Freq: Two times a day (BID) | ORAL | Status: DC | PRN
  Administered 2022-07-21: 20 mg via ORAL
  Filled 2022-07-21: qty 1

## 2022-07-21 MED ORDER — LIDOCAINE HCL (PF) 1 % IJ SOLN: 30.0000 mL | INTRAMUSCULAR | Status: DC | PRN

## 2022-07-21 MED ORDER — BUPIVACAINE HCL (PF) 0.25 % IJ SOLN
INTRAMUSCULAR | Status: DC | PRN
  Administered 2022-07-21 (×2): 4 mL via EPIDURAL

## 2022-07-21 MED ORDER — OXYTOCIN 10 UNIT/ML IJ SOLN
INTRAMUSCULAR | Status: AC
  Filled 2022-07-21: qty 2

## 2022-07-21 MED ORDER — MISOPROSTOL 25 MCG QUARTER TABLET
25.0000 ug | ORAL_TABLET | ORAL | Status: DC
  Administered 2022-07-21: 25 ug via ORAL
  Filled 2022-07-21 (×2): qty 1

## 2022-07-21 MED ORDER — DIPHENHYDRAMINE HCL 25 MG PO CAPS: 25.0000 mg | ORAL_CAPSULE | Freq: Four times a day (QID) | ORAL | Status: DC | PRN

## 2022-07-21 MED ORDER — ACETAMINOPHEN 500 MG PO TABS
1000.0000 mg | ORAL_TABLET | Freq: Four times a day (QID) | ORAL | Status: DC
  Administered 2022-07-21 – 2022-07-23 (×6): 1000 mg via ORAL
  Filled 2022-07-21 (×6): qty 2

## 2022-07-21 MED ORDER — ONDANSETRON HCL 4 MG PO TABS: 4.0000 mg | ORAL_TABLET | ORAL | Status: DC | PRN

## 2022-07-21 MED ORDER — IBUPROFEN 600 MG PO TABS
600.0000 mg | ORAL_TABLET | Freq: Four times a day (QID) | ORAL | Status: DC
  Administered 2022-07-22 – 2022-07-23 (×6): 600 mg via ORAL
  Filled 2022-07-21 (×6): qty 1

## 2022-07-21 MED ORDER — EPHEDRINE 5 MG/ML INJ
10.0000 mg | INTRAVENOUS | Status: DC | PRN
  Filled 2022-07-21: qty 5

## 2022-07-21 MED ORDER — LIDOCAINE HCL (PF) 1 % IJ SOLN
INTRAMUSCULAR | Status: AC
  Filled 2022-07-21: qty 30

## 2022-07-21 MED ORDER — FENTANYL CITRATE (PF) 100 MCG/2ML IJ SOLN: 50.0000 ug | INTRAMUSCULAR | Status: DC | PRN

## 2022-07-21 MED ORDER — SOD CITRATE-CITRIC ACID 500-334 MG/5ML PO SOLN: 30.0000 mL | ORAL | Status: DC | PRN

## 2022-07-21 MED ORDER — SODIUM CHLORIDE 0.9% FLUSH
3.0000 mL | Freq: Two times a day (BID) | INTRAVENOUS | Status: DC
  Administered 2022-07-22 – 2022-07-23 (×2): 3 mL via INTRAVENOUS

## 2022-07-21 MED ORDER — MISOPROSTOL 25 MCG QUARTER TABLET
25.0000 ug | ORAL_TABLET | ORAL | Status: DC
  Administered 2022-07-21: 25 ug via VAGINAL
  Filled 2022-07-21 (×2): qty 1

## 2022-07-21 MED ORDER — FERROUS SULFATE 325 (65 FE) MG PO TABS
325.0000 mg | ORAL_TABLET | Freq: Two times a day (BID) | ORAL | Status: DC
  Administered 2022-07-22 – 2022-07-23 (×3): 325 mg via ORAL
  Filled 2022-07-21 (×3): qty 1

## 2022-07-21 MED ORDER — FENTANYL-BUPIVACAINE-NACL 0.5-0.125-0.9 MG/250ML-% EP SOLN
12.0000 mL/h | EPIDURAL | Status: DC | PRN
  Administered 2022-07-21: 12 mL/h via EPIDURAL
  Filled 2022-07-21: qty 250

## 2022-07-21 MED ORDER — DIPHENHYDRAMINE HCL 50 MG/ML IJ SOLN: 12.5000 mg | INTRAMUSCULAR | Status: DC | PRN

## 2022-07-21 MED ORDER — ACETAMINOPHEN 500 MG PO TABS
1000.0000 mg | ORAL_TABLET | Freq: Four times a day (QID) | ORAL | Status: DC | PRN
  Administered 2022-07-21: 1000 mg via ORAL
  Filled 2022-07-21: qty 2

## 2022-07-21 MED ORDER — ONDANSETRON HCL 4 MG/2ML IJ SOLN
4.0000 mg | Freq: Four times a day (QID) | INTRAMUSCULAR | Status: DC | PRN
  Administered 2022-07-21: 4 mg via INTRAVENOUS
  Filled 2022-07-21: qty 2

## 2022-07-21 MED ORDER — MISOPROSTOL 200 MCG PO TABS
ORAL_TABLET | ORAL | Status: AC
  Filled 2022-07-21: qty 4

## 2022-07-21 MED ORDER — WITCH HAZEL-GLYCERIN EX PADS
1.0000 | MEDICATED_PAD | CUTANEOUS | Status: DC | PRN
  Filled 2022-07-21: qty 100

## 2022-07-21 MED ORDER — DIBUCAINE (PERIANAL) 1 % EX OINT: 1.0000 | TOPICAL_OINTMENT | CUTANEOUS | Status: DC | PRN

## 2022-07-21 MED ORDER — OXYTOCIN-SODIUM CHLORIDE 30-0.9 UT/500ML-% IV SOLN
2.5000 [IU]/h | INTRAVENOUS | Status: DC
  Administered 2022-07-21: 2.5 [IU]/h via INTRAVENOUS
  Filled 2022-07-21: qty 500

## 2022-07-21 MED ORDER — SENNOSIDES-DOCUSATE SODIUM 8.6-50 MG PO TABS
2.0000 | ORAL_TABLET | Freq: Every day | ORAL | Status: DC
  Administered 2022-07-22 – 2022-07-23 (×2): 2 via ORAL
  Filled 2022-07-21 (×2): qty 2

## 2022-07-21 NOTE — Anesthesia Procedure Notes (Addendum)
Epidural Patient location during procedure: OB Start time: 07/21/2022 1:01 PM End time: 07/21/2022 1:11 PM  Staffing Anesthesiologist: Martha Clan, MD Resident/CRNA: Aline Brochure, CRNA Performed: resident/CRNA   Preanesthetic Checklist Completed: patient identified, IV checked, site marked, risks and benefits discussed, surgical consent, monitors and equipment checked, pre-op evaluation and timeout performed  Epidural Patient position: sitting Prep: ChloraPrep Patient monitoring: heart rate, continuous pulse ox and blood pressure Approach: midline Location: L3-L4 Injection technique: LOR saline  Needle:  Needle type: Tuohy  Needle gauge: 17 G Needle length: 9 cm and 9 Needle insertion depth: 7 cm Catheter type: closed end flexible Catheter size: 19 Gauge Catheter at skin depth: 12 cm Test dose: negative and 1.5% lidocaine with Epi 1:200 K  Assessment Sensory level: T10 Events: blood not aspirated, no cerebrospinal fluid, injection not painful, no injection resistance, no paresthesia and negative IV test  Additional Notes 1 attempt Pt. Evaluated and documentation done after procedure finished. Patient identified. Risks/Benefits/Options discussed with patient including but not limited to bleeding, infection, nerve damage, paralysis, failed block, incomplete pain control, headache, blood pressure changes, nausea, vomiting, reactions to medication both or allergic, itching and postpartum back pain. Confirmed with bedside nurse the patient's most recent platelet count. Confirmed with patient that they are not currently taking any anticoagulation, have any bleeding history or any family history of bleeding disorders. Patient expressed understanding and wished to proceed. All questions were answered. Sterile technique was used throughout the entire procedure. Please see nursing notes for vital signs. Test dose was given through epidural catheter and negative prior to continuing  to dose epidural or start infusion. Warning signs of high block given to the patient including shortness of breath, tingling/numbness in hands, complete motor block, or any concerning symptoms with instructions to call for help. Patient was given instructions on fall risk and not to get out of bed. All questions and concerns addressed with instructions to call with any issues or inadequate analgesia.    Patient tolerated the insertion well without immediate complications.Reason for block:procedure for pain

## 2022-07-21 NOTE — Progress Notes (Signed)
L&D Note    Subjective:  Feeling contractions, feet are starting to feel tingly   Objective:   Vitals:   07/21/22 1314 07/21/22 1315 07/21/22 1316 07/21/22 1320  BP: 124/83  110/75 113/74  Pulse: 61  87 80  Resp:      Temp:      TempSrc:      SpO2:  100%    Weight:      Height:        Current Vital Signs 24h Vital Sign Ranges  T 97.8 F (36.6 C) Temp  Avg: 97.8 F (36.6 C)  Min: 97.8 F (36.6 C)  Max: 97.9 F (36.6 C)  BP 113/74 BP  Min: 101/64  Max: 126/84  HR 80 Pulse  Avg: 78.7  Min: 60  Max: 91  RR 16 Resp  Avg: 16.7  Min: 16  Max: 18  SaO2 100 %   SpO2  Avg: 97.9 %  Min: 94 %  Max: 100 %      Gen: alert, cooperative, no distress FHR: Baseline: 135 bpm, Variability: moderate, Accels: Present, Decels: none Toco: regular, every 3-5 minutes SVE: Dilation: 4 Effacement (%): 50 Cervical Position: Posterior Station: -2 Presentation: Vertex Exam by:: A Franks RN  Medications SCHEDULED MEDICATIONS   misoprostol  25 mcg Oral Q4H   And   misoprostol  25 mcg Vaginal Q4H   oxytocin 40 units in LR 1000 mL  333 mL Intravenous Once   sodium chloride flush  3 mL Intravenous Q12H    MEDICATION INFUSIONS   sodium chloride     clindamycin (CLEOCIN) IV 900 mg (07/21/22 0842)   fentaNYL 2 mcg/mL w/bupivacaine 0.125% in NS 250 mL 12 mL/hr (07/21/22 1314)   lactated ringers     lactated ringers     lactated ringers 125 mL/hr at 07/21/22 1252   oxytocin     oxytocin 10 milli-units/min (07/21/22 1115)    PRN MEDICATIONS  sodium chloride, acetaminophen, diphenhydrAMINE, ePHEDrine, ePHEDrine, famotidine, fentaNYL (SUBLIMAZE) injection, fentaNYL 2 mcg/mL w/bupivacaine 0.125% in NS 250 mL, lactated ringers, lidocaine (PF), ondansetron, phenylephrine, phenylephrine, sodium chloride flush, sodium citrate-citric acid, terbutaline   Assessment & Plan:  27 y.o. [id number] at 26w1dadmitted for elective at term and worsening POTS symptoms  -Labor: s/p cervical ripening with misoprostol,  now induction with oxytocin -Fetal Well-being: Category I -GBS: positive -Clindamycin x 2 -Membranes intact -Continue present management.  Will use variety of position changes to help with fetal descent.  Consider AROM when station appropriate -Analgesia: regional anesthesia   AMinda Meo CNM  07/21/2022 1:26 PM  [facility]

## 2022-07-21 NOTE — Discharge Summary (Signed)
Obstetrical Discharge Summary  Patient Name: [name] DOB: Apr 24, 1996 MRN: [id number]  Date of Admission: 07/21/2022 Date of Delivery: 07/21/2022 Delivered by: Drinda Butts, CNM  Date of Discharge: 07/23/2022  Primary OB: [facility] [id number] last menstrual period was 10/20/2021 (exact date). EDC Estimated Date of Delivery: 07/27/22 Gestational Age at Delivery: [redacted]w[redacted]d  Antepartum complications:  1. POTS (postural orthostatic tachycardia syndrome)   2. Encounter for elective induction of labor   3. Positive GBS test   4. Susceptible to Varicella (non-immune), currently pregnant in third trimester   5. Rh negative status during pregnancy in third trimester      Admitting Diagnosis: Encounter for elective induction of labor [Z34.90]  Secondary Diagnosis: Patient Active Problem List   Diagnosis Date Noted   POTS (postural orthostatic tachycardia syndrome) 07/18/2022   Positive GBS test 07/18/2022   Susceptible to Varicella (non-immune), currently pregnant in third trimester 07/18/2022   Rh negative status during pregnancy in third trimester 07/18/2022   Encounter for elective induction of labor 07/28/2019   Pelvic pain in pregnancy 05/19/2019   Palpitations 06/07/2018   Atypical chest pain 06/07/2018   Anxiety 06/07/2018   Syncope 04/14/2018   Vitamin D insufficiency 07/08/2016   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 Misoprostol was used for cervical ripening followed by oxytocin for induction. She progressed to C/C/+2 with an urge to push.  She pushed effectively over approximately 10 minutes for a spontaneous vaginal birth.  Delivery Type: spontaneous vaginal delivery Anesthesia: epidural anesthesia Placenta:  spontaneous To Pathology: No  Laceration: 1st degree vaginal - no repair  Episiotomy: none Newborn Data: Live born female "Emerlyn" Birth Weight:  3280 g APGAR: 8, 9   Newborn Delivery   Birth date/time: 07/21/2022 17:53:00 Delivery type: Vaginal, Spontaneous      Postpartum Procedures: none Edinburgh:     07/22/2022   12:00 AM 07/29/2019    1:35 AM  Edinburgh Postnatal Depression Scale Screening Tool  I have been able to laugh and see the funny side of things. 0 1  I have looked forward with enjoyment to things. 0 0  I have blamed myself unnecessarily when things went wrong. 1 2  I have been anxious or worried for no good reason. 2 3  I have felt scared or panicky for no good reason. 1 3  Things have been getting on top of me. 1 1  I have been so unhappy that I have had difficulty sleeping. 1 1  I have felt sad or miserable. 1 1  I have been so unhappy that I have been crying. 0 1  The thought of harming myself has occurred to me. 0 0  Edinburgh Postnatal Depression Scale Total 7 13     Post partum course:  Patient had an uncomplicated postpartum course.  By time of discharge on PPD#2, her pain was controlled on oral pain medications; she had appropriate lochia and was ambulating, voiding without difficulty and tolerating regular diet.  She was deemed stable for discharge to home.    Discharge Physical Exam:  BP 111/76 (BP Location: Left Arm)   Pulse (!) 51   Temp 97.7 F (36.5 C) (Oral)   Resp 17   Ht 5' 1"$  (1.549 m)   Wt 81.6 kg   LMP  10/20/2021 (Exact Date)   SpO2 98%   Breastfeeding Unknown   BMI 34.01 kg/m   General: NAD CV: RRR Pulm: CTABL, nl effort ABD: s/nd/nt, fundus firm and below the umbilicus Lochia: moderate Perineum:minimal edema/laceration hemostatic DVT Evaluation: LE non-ttp, no evidence of DVT on exam.  Hemoglobin  Date Value Ref Range Status  07/22/2022 10.4 (L) 12.0 - 15.0 g/dL Final   HGB  Date Value Ref Range Status  09/25/2014  12.2 12.0 - 16.0 g/dL Final   HCT  Date Value Ref Range Status  07/22/2022 33.8 (L) 36.0 - 46.0 % Final  09/27/2014 32.2 (L) 35.0 - 47.0 % Final    Risk assessment for postpartum VTE and prophylactic treatment: Very high risk factors: None High risk factors: None Moderate risk factors: BMI 30-40 kg/m2  Postpartum VTE prophylaxis with LMWH not indicated  Disposition: stable, discharge to home. Baby Feeding: breast and formula feeding Baby Disposition: home with mom  Rh Immune globulin indicated: Yes: given prior to discharge Rubella vaccine given: was not indicated Varivax vaccine given: was offered and declined by the patient Flu vaccine given in AP setting: declined  Tdap vaccine given in AP setting: declined   Contraception: Interval Postpartum BTL - Consent signed 05/09/2022  Prenatal Labs:  Blood type/Rh B NEG  Antibody screen neg  Rubella Immune    Varicella Non-Immune  RPR NR  HBsAg NR  Hep C NR  HIV NR  GC neg  Chlamydia neg  Genetic screening AFP negative   1 hour GTT 167  3 hour GTT 81, 137, 132, 77  GBS POS - susceptible to Clindamycin    Plan:  [name] was discharged to home in good condition. Follow-up appointment in 2 weeks for mood check and with delivering provider in 6 weeks.  Discharge Medications: Allergies as of 07/23/2022       Reactions   Amoxicillin Anaphylaxis   Penicillin G    Penicillins Anaphylaxis, Swelling   Has patient had a PCN reaction causing immediate rash, facial/tongue/throat swelling, SOB or lightheadedness with hypotension: Yes Has patient had a PCN reaction causing severe rash involving mucus membranes or skin necrosis: No Has patient had a PCN reaction that required hospitalization No Has patient had a PCN reaction occurring within the last 10 years: Yes If all of the above answers are "NO", then [place] proceed with Cephalosporin use.        Medication List     TAKE these medications    acetaminophen 500 MG  tablet Commonly known as: TYLENOL Take 2 tablets (1,000 mg total) by mouth every 6 (six) hours.   benzocaine-Menthol 20-0.5 % Aero Commonly known as: DERMOPLAST Apply 1 Application topically as needed for irritation (perineal discomfort).   coconut oil Oil Apply 1 Application topically as needed.   dibucaine 1 % Oint Commonly known as: NUPERCAINAL Place 1 Application rectally as needed for hemorrhoids.   ferrous sulfate 325 (65 FE) MG tablet Take 1 tablet (325 mg total) by mouth 2 (two) times daily with a meal.   ibuprofen 600 MG tablet Commonly known as: ADVIL Take 1 tablet (600 mg total) by mouth every 6 (six) hours.   Obtrex DHA 29-1 & 350 MG Misc Take 1 tablet by mouth daily.   oxyCODONE 5 MG immediate release tablet Commonly known as: Oxy IR/ROXICODONE Take 1 tablet (5 mg total) by mouth every 4 (four) hours as needed for severe pain.   senna-docusate 8.6-50 MG tablet Commonly known as: Senokot-S Take 2  tablets by mouth daily.   simethicone 80 MG chewable tablet Commonly known as: MYLICON Chew 1 tablet (80 mg total) by mouth as needed for flatulence.   witch hazel-glycerin pad Commonly known as: TUCKS Apply 1 Application topically as needed for hemorrhoids.         Follow-up Information     Benjaman Kindler, MD. Schedule an appointment as soon as possible for a visit.   Specialty: Obstetrics and Gynecology Why: in 2-4 weeks for postpartum tubal consult Contact information: [place] [address] [phone]         Minda Meo, CNM. Schedule an appointment as soon as possible for a visit in 2 week(s).   Specialty: Certified Nurse Midwife Why: postpartum mood check Contact information: [place] [address] [phone]         Minda Meo, CNM. Schedule an appointment as soon as possible for a visit in 6 week(s).   Specialty: Certified Nurse Midwife Why: postpartum visit Contact  information: [place] [address] [phone]                 Signed: Avelino Leeds CNM

## 2022-07-21 NOTE — ED Triage Notes (Signed)
Pt to [place]. OBGYN [facility].

## 2022-07-21 NOTE — H&P (Signed)
OB History & Physical   History of Present Illness:  Chief Complaint:   HPI:  [name] is a 27 y.o. [phone] female at 55w1ddated by LMP.  Scheduled for induction of labor for elective at term, worsening POTS symptoms.    She reports:  -active fetal movement -no leakage of fluid -no vaginal bleeding -no contractions  Pregnancy Issues: 1. Encounter for elective induction of labor   2. POTS (postural orthostatic tachycardia syndrome)   3. Positive GBS test   4. Susceptible to Varicella (non-immune), currently pregnant in third trimester   5. Rh negative status during pregnancy in third trimester     Maternal Medical History:   Past Medical History:  Diagnosis Date   Anemia    Anxiety    Bladder infection, acute 01/10/2015   completed antibiotics and denies any s&s of infection (01-30-15)   Dysrhythmia    occ irregular heart beat per pt   Gallstones 01/29/2015   GERD (gastroesophageal reflux disease)    NO MEDS   Headache    MIGRAINES DAILY    Past Surgical History:  Procedure Laterality Date   ADENOIDECTOMY     CHOLECYSTECTOMY N/A 02/01/2015   Procedure: LAPAROSCOPIC CHOLECYSTECTOMY WITH INTRAOPERATIVE CHOLANGIOGRAM;  Surgeon: JRobert Bellow MD;  Location: [facility] ORS;  Service: General;  Laterality: N/A;   CHOLECYSTECTOMY     CYSTOSCOPY W/ RETROGRADES Left 04/19/2021   Procedure: CYSTOSCOPY WITH RETROGRADE PYELOGRAM;  Surgeon: BBenjaman Kindler MD;  Location: [facility] ORS;  Service: Gynecology;  Laterality: Left;   TONSILLECTOMY     WISDOM TOOTH EXTRACTION     XI ROBOT ASSISTED DIAGNOSTIC LAPAROSCOPY N/A 04/19/2021   Procedure: XI ROBOT ASSISTED DIAGNOSTIC LAPAROSCOPY, EXCISION OF ENDOMETRIOSIS, LYSIS OF ADHESIONS;  Surgeon: BBenjaman Kindler MD;  Location: [facility] ORS;  Service: Gynecology;  Laterality: N/A;    Allergies  Allergen Reactions   Amoxicillin Anaphylaxis   Penicillin G    Penicillins Anaphylaxis and Swelling    Has patient had a PCN reaction causing  immediate rash, facial/tongue/throat swelling, SOB or lightheadedness with hypotension: Yes Has patient had a PCN reaction causing severe rash involving mucus membranes or skin necrosis: No Has patient had a PCN reaction that required hospitalization No Has patient had a PCN reaction occurring within the last 10 years: Yes If all of the above answers are "NO", then [place] proceed with Cephalosporin use.    Prior to Admission medications   Medication Sig Start Date End Date Taking? Authorizing Provider  Prenatal MV-Min-Fe Cbn-FA-DHA (OBTREX DHA) 29-1 & 350 MG MISC Take 1 tablet by mouth daily.   Yes [provider]  docusate calcium (SURFAK) 240 MG capsule Take 1 capsule (240 mg total) by mouth daily. Patient not taking: Reported on 06/07/2022 04/19/21   BBenjaman Kindler MD  Doxylamine-Pyridoxine (DICLEGIS PO) Take 10 mg by mouth 2 (two) times daily as needed (nausea/vomiting). Patient not taking: Reported on 07/21/2022    [provider]     [facility] [place]  reports that she quit smoking about 5 years ago. Her smoking use included cigarettes. She has a 4.00 pack-year smoking history. She has never used smokeless tobacco. She reports that she does not currently use alcohol. She reports that she does not use drugs.  Family History: family history includes Arrhythmia in her father; CAD in her paternal grandfather and paternal grandmother; Cancer in her maternal aunt; Healthy in her mother; Hyperlipidemia in her father; Hypertension in her father.  Review of Systems: A full review of systems was performed and negative except as noted in the HPI.    Physical Exam:  Vital Signs: BP 101/64 (BP Location: Left Arm)   Pulse 81   Temp 97.9 F (36.6 C) (Oral)   Resp 16   Ht 5' 1"$  (1.549 m)   Wt 81.6 kg   LMP 10/20/2021 (Exact Date)   SpO2 98%   BMI 34.01 kg/m   General:   alert and cooperative  Skin:  normal  Neurologic:     Alert & oriented x 3  Lungs:    Nl effort  Heart:   regular rate and rhythm  Abdomen:  soft, non-tender; bowel sounds normal; no masses,  no organomegaly  Extremities: : non-tender, symmetric, no edema bilaterally.      Results for orders placed or performed during the hospital encounter of 07/21/22 (from the past 24 hour(s))  CBC     Status: Abnormal   Collection Time: 07/21/22 12:19 AM  Result Value Ref Range   WBC 12.5 (H) 4.0 - 10.5 K/uL   RBC 4.00 3.87 - 5.11 MIL/uL   Hemoglobin 10.7 (L) 12.0 - 15.0 g/dL   HCT 33.5 (L) 36.0 - 46.0 %   MCV 83.8 80.0 - 100.0 fL   MCH 26.8 26.0 - 34.0 pg   MCHC 31.9 30.0 - 36.0 g/dL   RDW 14.6 11.5 - 15.5 %   Platelets 226 150 - 400 K/uL   nRBC 0.0 0.0 - 0.2 %  Type and screen     Status: None   Collection Time: 07/21/22 12:19 AM  Result Value Ref Range   ABO/RH(D) B NEG    Antibody Screen POS    Sample Expiration 07/24/2022,2359    Antibody Identification      PASSIVELY ACQUIRED ANTI-D Performed at [facility], [address] [place], [place] [address]     Pertinent Results:  Prenatal Labs: Blood type/Rh B NEG  Antibody screen neg  Rubella Immune    Varicella Non-Immune  RPR NR  HBsAg NR  Hep C NR  HIV NR  GC neg  Chlamydia neg  Genetic screening AFP negative   1 hour GTT 167  3 hour GTT 81, 137, 132, 77  GBS POS - susceptible to Clindamycin   FHT: FHR: 120 bpm, variability: moderate,  accelerations:  Present,  decelerations:  Absent Category/reactivity:  Category I TOCO: regular, every 2-3 minutes SVE: Dilation: 1 / Effacement (%): 52 / Station: Ballotable      Assessment:  [name] is a 27 y.o. [id number] female at 27w1dfor a scheduled for induction of labor for elective at term, worsening POTS symptoms.   Plan:  1. Admit to Labor & Delivery; consents reviewed and obtained  2. Fetal Well being  - Fetal Tracing: Cat I - GBS pos - Presentation: vtx confirmed by sve   3. Routine OB: - Prenatal labs  reviewed, as above - Rh neg - CBC & T&S on admit - Clear fluids, IVFor saline lock  4. Induction of Labor -  Contractions by external toco in place -  Pelvis proven to 3570g -  Plan for induction with Cytotec -  Plan for continuous fetal monitoring  -  Maternal pain control as desired: IVPM, nitrous, regional anesthesia - Anticipate vaginal delivery  5. Post Partum Planning: Flu:  declined  Tdap:  declined  RSV:  declined  Contraception: Tubal Ligation - consent signed 05/09/2022 - [facility]  preference: breast feeding  [name], CNM 07/21/2022 7:15 AM

## 2022-07-21 NOTE — Anesthesia Preprocedure Evaluation (Signed)
Anesthesia Evaluation  Patient identified by MRN, date of birth, ID band  Reviewed: Allergy & Precautions, H&P , NPO status , Patient's Chart, lab work & pertinent test results  History of Anesthesia Complications Negative for: history of anesthetic complications  Airway Mallampati: II       Dental no notable dental hx.    Pulmonary former smoker   Pulmonary exam normal        Cardiovascular Normal cardiovascular exam     Neuro/Psych  Headaches  Anxiety        GI/Hepatic Neg liver ROS,GERD  ,,  Endo/Other  negative endocrine ROS    Renal/GU negative Renal ROS  negative genitourinary   Musculoskeletal   Abdominal   Peds  Hematology  (+) Blood dyscrasia, anemia   Anesthesia Other Findings   Reproductive/Obstetrics (+) Pregnancy                             Anesthesia Physical Anesthesia Plan  ASA: 2  Anesthesia Plan: Epidural   Post-op Pain Management:    Induction:   PONV Risk Score and Plan:   Airway Management Planned:   Additional Equipment:   Intra-op Plan:   Post-operative Plan:   Informed Consent:   Plan Discussed with: Anesthesiologist  Anesthesia Plan Comments:        Anesthesia Quick Evaluation

## 2022-07-21 NOTE — Discharge Instructions (Signed)
Vaginal Delivery, Care After Refer to this sheet in the next few weeks. These discharge instructions provide you with information on caring for yourself after delivery. Your caregiver Lofaro also give you specific instructions. Your treatment has been planned according to the most current medical practices available, but problems sometimes occur. Call your caregiver if you have any problems or questions after you go home. HOME CARE INSTRUCTIONS Take over-the-counter or prescription medicines only as directed by your caregiver or pharmacist. Do not drink alcohol, especially if you are breastfeeding or taking medicine to relieve pain. Do not smoke tobacco. Continue to use good perineal care. Good perineal care includes: Wiping your perineum from back to front Keeping your perineum clean. You can do sitz baths twice a day, to help keep this area clean Do not use tampons, douche or have sex until your caregiver says it is okay. Shower only and avoid sitting in submerged water, aside from sitz baths Wear a well-fitting bra that provides breast support. Eat healthy foods. Drink enough fluids to keep your urine clear or pale yellow. Eat high-fiber foods such as whole grain cereals and breads, brown rice, beans, and fresh fruits and vegetables every day. These foods [place] help prevent or relieve constipation. Avoid constipation with high fiber foods or medications, such as miralax or metamucil Follow your caregiver's recommendations regarding resumption of activities such as climbing stairs, driving, lifting, exercising, or traveling. Talk to your caregiver about resuming sexual activities. Resumption of sexual activities is dependent upon your risk of infection, your rate of healing, and your comfort and desire to resume sexual activity. Try to have someone help you with your household activities and your newborn for at least a few days after you leave the hospital. Rest as much as possible. Try to rest or  take a nap when your newborn is sleeping. Increase your activities gradually. Keep all of your scheduled postpartum appointments. It is very important to keep your scheduled follow-up appointments. At these appointments, your caregiver will be checking to make sure that you are healing physically and emotionally. SEEK MEDICAL CARE IF:  You are passing large clots from your vagina. Save any clots to show your caregiver. You have a foul smelling discharge from your vagina. You have trouble urinating. You are urinating frequently. You have pain when you urinate. You have a change in your bowel movements. You have increasing redness, pain, or swelling near your vaginal incision (episiotomy) or vaginal tear. You have pus draining from your episiotomy or vaginal tear. Your episiotomy or vaginal tear is separating. You have painful, hard, or reddened breasts. You have a severe headache. You have blurred vision or see spots. You feel sad or depressed. You have thoughts of hurting yourself or your newborn. You have questions about your care, the care of your newborn, or medicines. You are dizzy or light-headed. You have a rash. You have nausea or vomiting. You were breastfeeding and have not had a menstrual period within 12 weeks after you stopped breastfeeding. You are not breastfeeding and have not had a menstrual period by the 12th week after delivery. You have a fever. SEEK IMMEDIATE MEDICAL CARE IF:  You have persistent pain. You have chest pain. You have shortness of breath. You faint. You have leg pain. You have stomach pain. Your vaginal bleeding saturates two or more sanitary pads in 1 hour. MAKE SURE YOU:  Understand these instructions. Will watch your condition. Will get help right away if you are not doing well or   get worse. Document Released: 05/23/2000 Document Revised: 10/10/2013 Document Reviewed: 01/21/2012 ExitCare Patient Information 2015 [employer]. This  information is not intended to replace advice given to you by your health care provider. Make sure you discuss any questions you have with your health care provider.  Sitz Bath A sitz bath is a warm water bath taken in the sitting position. The water covers only the hips and butt (buttocks). We recommend using one that fits in the toilet, to help with ease of use and cleanliness. It [place] be used for either healing or cleaning purposes. Sitz baths are also used to relieve pain, itching, or muscle tightening (spasms). The water [place] contain medicine. Moist heat will help you heal and relax.  HOME CARE  Take 3 to 4 sitz baths a day. Fill the bathtub half-full with warm water. Sit in the water and open the drain a little. Turn on the warm water to keep the tub half-full. Keep the water running constantly. Soak in the water for 15 to 20 minutes. After the sitz bath, pat the affected area dry. GET HELP RIGHT AWAY IF: You get worse instead of better. Stop the sitz baths if you get worse. MAKE SURE YOU: Understand these instructions. Will watch your condition. Will get help right away if you are not doing well or get worse. Document Released: 07/03/2004 Document Revised: 02/18/2012 Document Reviewed: 09/23/2010 ExitCare Patient Information 2015 [employer]. This information is not intended to replace advice given to you by your health care provider. Make sure you discuss any questions you have with your health care provider.   

## 2022-07-21 NOTE — Plan of Care (Signed)
Care plan complete

## 2022-07-21 NOTE — Progress Notes (Signed)
L&D Note    Subjective:  Feeling contractions, reports as painful but coping well with them  Objective:   Vitals:   07/21/22 0355 07/21/22 0400 07/21/22 0405 07/21/22 0729  BP:    (!) 105/55  Pulse:    60  Resp:    16  Temp:    97.8 F (36.6 C)  TempSrc:    Oral  SpO2: 97% 96% 98%   Weight:      Height:        Current Vital Signs 24h Vital Sign Ranges  T 97.8 F (36.6 C) Temp  Avg: 97.8 F (36.6 C)  Min: 97.8 F (36.6 C)  Max: 97.9 F (36.6 C)  BP (!) 105/55 BP  Min: 101/64  Max: 121/89  HR 60 Pulse  Avg: 73.7  Min: 60  Max: 81  RR 16 Resp  Avg: 16.7  Min: 16  Max: 18  SaO2 98 %   SpO2  Avg: 97.7 %  Min: 94 %  Max: 99 %      Gen: alert, cooperative, no distress FHR: Baseline: 130 bpm, Variability: moderate, Accels: Present, Decels: none Toco: regular, every 3-5 minutes SVE: Dilation: 2.5 Effacement (%): 50 Cervical Position: Posterior Station: -2 Presentation: Vertex Exam by:: A Tahjir Silveria CNM  Medications SCHEDULED MEDICATIONS   ammonia       lidocaine (PF)       misoprostol       misoprostol  25 mcg Oral Q4H   And   misoprostol  25 mcg Vaginal Q4H   oxytocin       oxytocin 40 units in LR 1000 mL  333 mL Intravenous Once   sodium chloride flush  3 mL Intravenous Q12H    MEDICATION INFUSIONS   sodium chloride     clindamycin (CLEOCIN) IV 900 mg (07/21/22 0842)   lactated ringers     lactated ringers 125 mL/hr at 07/21/22 0553   oxytocin     oxytocin 2 milli-units/min (07/21/22 0915)    PRN MEDICATIONS  sodium chloride, acetaminophen, ammonia, fentaNYL (SUBLIMAZE) injection, lactated ringers, lidocaine (PF), lidocaine (PF), misoprostol, ondansetron, oxytocin, sodium chloride flush, sodium citrate-citric acid, terbutaline   Assessment & Plan:  27 y.o. [id number] at 38w1dadmitted for elective at term and worsening POTS symptoms  -Labor: Cervical ripening with misoprostol  -Fetal Well-being: Category I -GBS: positive - Clindamycin sensitive. Abx x  2 -Membranes intact -Intervention: IV Pitocin induction -Analgesia:  planning epidural when contractions become more painful   AMinda Meo CNM  07/21/2022 9:19 AM  [facility]

## 2022-07-22 LAB — CBC
HCT: 33.8 % — ABNORMAL LOW (ref 36.0–46.0)
Hemoglobin: 10.4 g/dL — ABNORMAL LOW (ref 12.0–15.0)
MCH: 26.4 pg (ref 26.0–34.0)
MCHC: 30.8 g/dL (ref 30.0–36.0)
MCV: 85.8 fL (ref 80.0–100.0)
Platelets: 189 10*3/uL (ref 150–400)
RBC: 3.94 MIL/uL (ref 3.87–5.11)
RDW: 15 % (ref 11.5–15.5)
WBC: 14.1 10*3/uL — ABNORMAL HIGH (ref 4.0–10.5)
nRBC: 0 % (ref 0.0–0.2)

## 2022-07-22 LAB — FETAL SCREEN: Fetal Screen: NEGATIVE

## 2022-07-22 MED ORDER — RHO D IMMUNE GLOBULIN 1500 UNIT/2ML IJ SOSY
300.0000 ug | PREFILLED_SYRINGE | Freq: Once | INTRAMUSCULAR | Status: AC
  Administered 2022-07-22: 300 ug via INTRAVENOUS
  Filled 2022-07-22: qty 2

## 2022-07-22 MED ORDER — OXYCODONE HCL 5 MG PO TABS
5.0000 mg | ORAL_TABLET | Freq: Once | ORAL | Status: DC
  Filled 2022-07-22: qty 1

## 2022-07-22 NOTE — Progress Notes (Signed)
Postpartum Day  1  Subjective: no complaints, up ad lib, voiding, and tolerating PO  Doing well, no concerns. Ambulating without difficulty, pain managed with PO meds, tolerating regular diet, and voiding without difficulty.   No fever/chills, chest pain, shortness of breath, nausea/vomiting, or leg pain. No nipple or breast pain. No headache, visual changes, or RUQ/epigastric pain.  Objective: BP 102/70 (BP Location: Right Arm)   Pulse 61   Temp 97.9 F (36.6 C) (Oral)   Resp 16   Ht 5' 1"$  (1.549 m)   Wt 81.6 kg   LMP 10/20/2021 (Exact Date)   SpO2 99%   Breastfeeding Unknown   BMI 34.01 kg/m    Physical Exam:  General: alert, cooperative, and no distress Breasts: soft/nontender CV: RRR Pulm: nl effort, CTABL Abdomen: soft, non-tender, active bowel sounds Uterine Fundus: firm Perineum: minimal edema, laceration hemostatic Lochia: appropriate DVT Evaluation: No evidence of DVT seen on physical exam.  Recent Labs    07/21/22 0019 07/22/22 0525  HGB 10.7* 10.4*  HCT 33.5* 33.8*  WBC 12.5* 14.1*  PLT 226 189    Assessment/Plan: 27 y.o. [id number] postpartum day # 1  -Continue routine postpartum care -Lactation consult PRN for breastfeeding  -Acute blood loss anemia - hemodynamically stable and asymptomatic; start PO ferrous sulfate BID with stool softeners  -Immunization status:   needs Varicella prior to discharge    Disposition: Continue inpatient postpartum care. Desires discharge home today if infant able to discharge.    LOS: 1 day   Minda Meo, [place] 07/22/2022, 9:06 AM   ----- Drinda Butts  Certified Nurse Midwife [facility]

## 2022-07-22 NOTE — Progress Notes (Signed)
Patient's post-delivery recovery significantly extended by lack of sensation in both lower extremities d/t epidural. Once strength was regained in lower extremities, patient had difficulty urinating. Two attempts were made using the steady as an aid to the bathroom but both attempts were unsuccessful. A red rubber catheter was use to empty the patients bladder at 2335 and she was subsequently moved to [facility].  Juanantonio Stolar N Ladena Jacquez 07/22/22 12:16 AM

## 2022-07-22 NOTE — Anesthesia Postprocedure Evaluation (Signed)
Anesthesia Post Note  Patient: [name]  Procedure(s) Performed: AN AD HOC LABOR EPIDURAL  Patient location during evaluation: Mother Baby Anesthesia Type: Epidural Level of consciousness: awake, awake and alert and oriented Pain management: pain level controlled Vital Signs Assessment: post-procedure vital signs reviewed and stable Respiratory status: spontaneous breathing and nonlabored ventilation Cardiovascular status: blood pressure returned to baseline and stable Postop Assessment: no backache and no headache Anesthetic complications: no  No notable events documented.   Last Vitals:  Vitals:   07/22/22 0750 07/22/22 0800  BP: 95/65 102/70  Pulse: 61   Resp: 16   Temp: 36.6 C   SpO2: 99%     Last Pain:  Vitals:   07/22/22 0920  TempSrc:   PainSc: 7                  [employer]

## 2022-07-23 LAB — RHOGAM INJECTION: Unit division: 0

## 2022-07-23 MED ORDER — BENZOCAINE-MENTHOL 20-0.5 % EX AERO: 1.0000 | INHALATION_SPRAY | CUTANEOUS | Status: DC | PRN

## 2022-07-23 MED ORDER — ACETAMINOPHEN 500 MG PO TABS: 1000.0000 mg | ORAL_TABLET | Freq: Four times a day (QID) | ORAL | 0 refills | Status: DC

## 2022-07-23 MED ORDER — WITCH HAZEL-GLYCERIN EX PADS: 1.0000 | MEDICATED_PAD | CUTANEOUS | 12 refills | Status: DC | PRN

## 2022-07-23 MED ORDER — OXYCODONE HCL 5 MG PO TABS: 5.0000 mg | ORAL_TABLET | ORAL | 0 refills | Status: DC | PRN

## 2022-07-23 MED ORDER — SIMETHICONE 80 MG PO CHEW: 80.0000 mg | CHEWABLE_TABLET | ORAL | 0 refills | Status: DC | PRN

## 2022-07-23 MED ORDER — IBUPROFEN 600 MG PO TABS: 600.0000 mg | ORAL_TABLET | Freq: Four times a day (QID) | ORAL | 0 refills | Status: DC

## 2022-07-23 MED ORDER — FERROUS SULFATE 325 (65 FE) MG PO TABS: 325.0000 mg | ORAL_TABLET | Freq: Two times a day (BID) | ORAL | 3 refills | Status: DC

## 2022-07-23 MED ORDER — DIBUCAINE (PERIANAL) 1 % EX OINT: 1.0000 | TOPICAL_OINTMENT | CUTANEOUS | Status: DC | PRN

## 2022-07-23 MED ORDER — SENNOSIDES-DOCUSATE SODIUM 8.6-50 MG PO TABS: 2.0000 | ORAL_TABLET | Freq: Every day | ORAL | Status: DC

## 2022-07-23 MED ORDER — COCONUT OIL OIL: 1.0000 | TOPICAL_OIL | 0 refills | Status: DC | PRN

## 2022-07-23 NOTE — Progress Notes (Signed)
Patient discharged. Discharge instructions given. Patient verbalizes understanding. Transported by axillary.

## 2022-09-18 ENCOUNTER — Ambulatory Visit (INDEPENDENT_AMBULATORY_CARE_PROVIDER_SITE_OTHER): Payer: Medicaid Other

## 2022-09-18 ENCOUNTER — Ambulatory Visit
Admission: EM | Admit: 2022-09-18 | Discharge: 2022-09-18 | Disposition: A | Discharge status: Home / Self Care | Payer: Medicaid Other | Attending: Internal Medicine | Admitting: Internal Medicine

## 2022-09-18 DIAGNOSIS — M25561 Pain in right knee: Secondary | ICD-10-CM

## 2022-09-18 DIAGNOSIS — S838X1A Sprain of other specified parts of right knee, initial encounter: Secondary | ICD-10-CM | POA: Diagnosis not present

## 2022-09-18 MED ORDER — IBUPROFEN 600 MG PO TABS: 600.0000 mg | ORAL_TABLET | Freq: Four times a day (QID) | ORAL | 0 refills | Status: DC | PRN

## 2022-09-18 MED ORDER — METHOCARBAMOL 500 MG PO TABS: 500.0000 mg | ORAL_TABLET | Freq: Every evening | ORAL | 0 refills | Status: DC | PRN
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
## 2022-09-18 NOTE — Discharge Instructions (Addendum)
X-ray of your right knee is negative for fracture Please take medications as directed Gentle stretching exercises Please do not drive or operate heavy machinery after taking muscle relaxant If you experience headache, blurry vision, vomiting or confusion-please return to urgent care immediately to be reevaluated.

## 2022-09-23 DIAGNOSIS — S8001XA Contusion of right knee, initial encounter: Secondary | ICD-10-CM | POA: Insufficient documentation

## 2022-09-23 DIAGNOSIS — M5416 Radiculopathy, lumbar region: Secondary | ICD-10-CM | POA: Insufficient documentation

## 2022-10-20 DIAGNOSIS — M2391 Unspecified internal derangement of right knee: Secondary | ICD-10-CM | POA: Insufficient documentation

## 2022-10-20 DIAGNOSIS — M25561 Pain in right knee: Secondary | ICD-10-CM | POA: Insufficient documentation

## 2022-11-06 ENCOUNTER — Other Ambulatory Visit: Payer: Medicaid Other

## 2023-01-14 ENCOUNTER — Ambulatory Visit: Payer: Medicaid Other | Attending: Obstetrics and Gynecology | Admitting: Physical Therapy

## 2023-01-19 ENCOUNTER — Encounter: Payer: Medicaid Other | Admitting: Physical Therapy

## 2023-01-26 ENCOUNTER — Encounter: Payer: Medicaid Other | Admitting: Physical Therapy

## 2023-02-23 NOTE — H&P (Signed)
Chief Complaint:  Ms. Serviss is a 27 y.o. female [phone] here for Pre Op Consulting . History of Present Illness: Preop Visit for Interval BTL  The patient is postpartum from a SVD delivery on 07/21/22. She presents to discuss permanent sterilization. In her own words, " She would like to have another tubal with LOA and excision of endometriosis." She doesn't want any more children, but she is not ready for a hysterectomy yet.   Endometriosis, confirmed by RA Dx Lap 04/2021: Stage 2-3 endometriosis   Currently bothered by her bloating. She had a BM, emesis and still feels bothered by this. 3 weeks now.   Pertinent Hx: -Last pap smear: 2020 NILM  -History of abnormal pap smears: no -Sexually active: yes -Current contraception: just had baby, no intercourse yet             -S/p Nexplanon, depo, OCPs, IUD.  -History of sexually transmitted infections: chlamydia    -Chronic pelvic pain: Years of dull stabbing vaginal and pelvic pain both with and without period and with intercourse.   -Endometriosis, confirmed by RA Dx Lap 04/2021: Stage 2-3 endometriosis   She is worried about going to sleep and concerned about meds for pain, which she would like to avoid. She also would like her partner with her preop, which didn't happen last time because covid rules.  Past Medical History:  has a past medical history of Allergic rhinitis, Anxiety, Depression, Endometriosis of uterus, GERD (gastroesophageal reflux disease), and Migraines.  Past Surgical History:  has a past surgical history that includes Cholecystectomy; WISDOM TEETH; Pelvic laparoscopy; Cystoscopy; lysis of adhesions; excision of endometriosis; and Tonsillectomy & Adenoidectomy. Family History: family history includes Diabetes type II in her father; Heart disease in her father; No Known Problems in her mother. Social History:  reports that she has quit smoking. She has never used smokeless tobacco. She reports that she does not  currently use alcohol. She reports that she does not use drugs. OB/GYN History:  OB History    Gravida 4  Para 3  Term 3  Preterm    AB 1  Living 3    SAB 1  IAB    Ectopic    Molar    Multiple 0  Live Births 3       Allergies: is allergic to amoxicillin and penicillin. Medications:  Current Outpatient Medications:    acetaminophen (TYLENOL) 325 MG tablet, Take 650 mg by mouth every 4 (four) hours as needed for Pain, Disp: , Rfl:    celecoxib (CELEBREX) 200 MG capsule, , Disp: , Rfl:    ibuprofen (MOTRIN) 600 MG tablet, Take 600 mg by mouth every 6 (six) hours, Disp: , Rfl:    meloxicam (MOBIC) 15 MG tablet, TAKE 1 TABLET BY MOUTH ONCE DAILY WITH MEALS, Disp: , Rfl:    prenatal vit-CA-MIN-FE-FA (KPN ORAL) tablet, Take 1 tablet by mouth once daily, Disp: 30 tablet, Rfl: 6   doxylamine-pyridoxine, vit B6, (DICLEGIS) 10-10 mg DR tablet, 2 Tablets ay night.  Add 1 tablet on day 3 in the morning if symptoms continue.  If still symptomatic on day 4 add 1 tablet in the afternoon. (Patient not taking: Reported on 08/26/2022), Disp: 120 tablet, Rfl: 1   NIGHTTIME SLEEP-AID, DOXYLAMN, 25 mg tablet, as directed (Patient not taking: Reported on 08/26/2022), Disp: , Rfl:    oxyCODONE (ROXICODONE) 5 MG immediate release tablet, TAKE 1 TABLET BY MOUTH EVERY 4 HOURS AS NEEDED FOR SEVERE PAIN (Patient not taking:  Reported on 08/26/2022), Disp: , Rfl:    PNV no.153/FA/om3/dha/epa/fish (PRENATAL GUMMIES ORAL), Take by mouth 2 (two) times daily (Patient not taking: Reported on 03/13/2022), Disp: , Rfl:    VITAMIN B-6 25 MG tablet, , Disp: , Rfl:   Review of Systems: No SOB, no palpitations or chest pain, no new lower extremity edema, no nausea or vomiting or bowel or bladder complaints. See HPI for gyn specific ROS.   Exam:  BP 114/81   Pulse 92   Ht 152.4 cm (5')   Wt 76.2 kg (168 lb)   LMP 02/03/2023 (Exact Date)   BMI 32.81 kg/m   Constitutional:  General  appearance: Well nourished, well developed female in no acute distress.  Neuro/psych:  Normal mood and affect. No gross motor deficits. Neck:  Supple, normal appearance.  Respiratory:  Normal respiratory effort, no use of accessory muscles Skin:  No visible rashes or external lesions   Impression:  The primary encounter diagnosis was Preop examination. Diagnoses of Endometriosis, Unwanted fertility, and Nausea were also pertinent to this visit.  Plan:  1. Request for Permanent Sterilization, Endometriosis, Chronic pelvic pain  -Patient desires surgical sterilization.  Patient has been counseled on alternate forms of contraception including hormonal forms, IUD's and barrier methods. She has been counseled on risks of surgical sterilization including bleeding, infection, pain, injury during procedure, risk of need for further procedures/surgeries due to injury or abnormalities at the time of surgery, thromboembolic events, exacerbation of ongoing medical conditions, risk of ectopic pregnancy, risk of failure of procedure to prevent pregnancy, medication reactions as well as the risk of anesthesia.  Patient verbalizes understanding.  Preoperative and postoperative instructions provided. Written and verbal education provided.  No barriers to learning.  We will plan for Dx Lap Bilateral salpingectomy with LOA and excision of endometriosis   Plan for trial of orlissa postop, and referral to PFPT  Zofran for nausea, prn   Return for Postop check.  ~~~~~~~~~~~~~~~~~~~~~~~~~~~~~~~~~~~~~~~~~~~~~~~~~~~~~~~~~~~~ This note is partially written by Jerene Canny, in the presence of and acting as the scribe of Dr. Christeen Douglas, who has reviewed, edited and added to the note to reflect her best personal medical judgment.  This note was generated in part with voice recognition software and I apologize for any typographical errors that were not detected and corrected.   Attestation Statement:  I  personally performed the service. (TP)  Arvilla Salada Babette Relic, MD

## 2023-02-26 ENCOUNTER — Encounter
Admission: RE | Admit: 2023-02-26 | Discharge: 2023-02-26 | Disposition: A | Discharge status: Home / Self Care | Payer: Medicaid Other | Source: Ambulatory Visit | Attending: Obstetrics and Gynecology | Admitting: Obstetrics and Gynecology

## 2023-02-26 ENCOUNTER — Other Ambulatory Visit: Payer: Self-pay

## 2023-02-26 DIAGNOSIS — Z01812 Encounter for preprocedural laboratory examination: Secondary | ICD-10-CM

## 2023-02-26 HISTORY — DX: Postural orthostatic tachycardia syndrome (POTS): G90.A

## 2023-02-26 HISTORY — DX: Depression, unspecified: F32.A

## 2023-02-26 NOTE — Patient Instructions (Addendum)
Your procedure is scheduled on: 03/06/23 - Friday Report to the Registration Desk on the 1st floor of the [facility]. To find out your arrival time, please call [phone] between 1PM - 3PM on: 03/05/23 - Thursday If your arrival time is 6:00 am, do not arrive before that time as the [facility] entrance doors do not open until 6:00 am.  REMEMBER: Instructions that are not followed completely [name] result in serious medical risk, up to and including death; or upon the discretion of your surgeon and anesthesiologist your surgery [name] need to be rescheduled.  Do not eat food after midnight the night before surgery.  No gum chewing or hard candies.  You [name] however, drink CLEAR liquids up to 2 hours before you are scheduled to arrive for your surgery. Do not drink anything within 2 hours of your scheduled arrival time.  Clear liquids include: - water  - apple juice without pulp - gatorade (not RED colors) - black coffee or tea (Do NOT add milk or creamers to the coffee or tea) Do NOT drink anything that is not on this list.  One week prior to surgery: Stop Anti-inflammatories (NSAIDS) such as Advil, Aleve, Ibuprofen, Motrin, Naproxen, Naprosyn and Aspirin based products such as Excedrin, Goody's Powder, BC Powder. You [name] however, continue to take Tylenol if needed for pain up until the day of surgery.  Stop ANY OVER THE COUNTER supplements until after surgery.   TAKE ONLY THESE MEDICATIONS THE MORNING OF SURGERY WITH A SIP OF WATER:  NONE  No Alcohol for 24 hours before or after surgery.  No Smoking including e-cigarettes for 24 hours before surgery.  No chewable tobacco products for at least 6 hours before surgery.  No nicotine patches on the day of surgery.  Do not use any "recreational" drugs for at least a week (preferably 2 weeks) before your surgery.  Please be advised that the combination of cocaine and anesthesia [name] have negative outcomes, up to and including  death. If you test positive for cocaine, your surgery will be cancelled.  On the morning of surgery brush your teeth with toothpaste and water, you [name] rinse your mouth with mouthwash if you wish. Do not swallow any toothpaste or mouthwash.  Use CHG Soap or wipes as directed on instruction sheet.  Do not wear jewelry, make-up, hairpins, clips or nail polish.  For welded (permanent) jewelry: bracelets, anklets, waist bands, etc.  Please have this removed prior to surgery.  If it is not removed, there is a chance that hospital personnel will need to cut it off on the day of surgery.  Do not wear lotions, powders, or perfumes.   Do not shave body hair from the neck down 48 hours before surgery.  Contact lenses, hearing aids and dentures [name] not be worn into surgery.  Do not bring valuables to the hospital. [facility] is not responsible for any missing/lost belongings or valuables.   Notify your doctor if there is any change in your medical condition (cold, fever, infection).  Wear comfortable clothing (specific to your surgery type) to the hospital.  After surgery, you can help prevent lung complications by doing breathing exercises.  Take deep breaths and cough every 1-2 hours. Your doctor [name] order a device called an Incentive Spirometer to help you take deep breaths. When coughing or sneezing, hold a pillow firmly against your incision with both hands. This is called "splinting." Doing this helps protect your incision. It also decreases belly discomfort.  If you are being admitted to the hospital overnight, leave your suitcase in the car. After surgery it [name] be brought to your room.  In case of increased patient census, it [name] be necessary for you, the patient, to continue your postoperative care in the Same Day Surgery department.  If you are being discharged the day of surgery, you will not be allowed to drive home. You will need a responsible individual to drive you home and  stay with you for 24 hours after surgery.   If you are taking public transportation, you will need to have a responsible individual with you.  Please call the Pre-admissions Testing Dept. at [phone] if you have any questions about these instructions.  Surgery Visitation Policy:  Patients having surgery or a procedure [name] have two visitors.  Children under the age of 42 must have an adult with them who is not the patient.  Inpatient Visitation:    Visiting hours are 7 a.m. to 8 p.m. Up to four visitors are allowed at one time in a patient room. The visitors [name] rotate out with other people during the day.  One visitor age 71 or older [name] stay with the patient overnight and must be in the room by 8 p.m. How to Use an Incentive Spirometer  An incentive spirometer is a tool that measures how well you are filling your lungs with each breath. Learning to take long, deep breaths using this tool can help you keep your lungs clear and active. This [name] help to reverse or lessen your chance of developing breathing (pulmonary) problems, especially infection. You [name] be asked to use a spirometer: After a surgery. If you have a lung problem or a history of smoking. After a long period of time when you have been unable to move or be active. If the spirometer includes an indicator to show the highest number that you have reached, your health care provider or respiratory therapist will help you set a goal. Keep a log of your progress as told by your health care provider. What are the risks? Breathing too quickly [name] cause dizziness or cause you to pass out. Take your time so you do not get dizzy or light-headed. If you are in pain, you [name] need to take pain medicine before doing incentive spirometry. It is harder to take a deep breath if you are having pain. How to use your incentive spirometer  Sit up on the edge of your bed or on a chair. Hold the incentive spirometer so that it is in an upright  position. Before you use the spirometer, breathe out normally. Place the mouthpiece in your mouth. Make sure your lips are closed tightly around it. Breathe in slowly and as deeply as you can through your mouth, causing the piston or the ball to rise toward the top of the chamber. Hold your breath for 3-5 seconds, or for as long as possible. If the spirometer includes a coach indicator, use this to guide you in breathing. Slow down your breathing if the indicator goes above the marked areas. Remove the mouthpiece from your mouth and breathe out normally. The piston or ball will return to the bottom of the chamber. Rest for a few seconds, then repeat the steps 10 or more times. Take your time and take a few normal breaths between deep breaths so that you do not get dizzy or light-headed. Do this every 1-2 hours when you are awake. If the spirometer includes a  goal marker to show the highest number you have reached (best effort), use this as a goal to work toward during each repetition. After each set of 10 deep breaths, cough a few times. This will help to make sure that your lungs are clear. If you have an incision on your chest or abdomen from surgery, place a pillow or a rolled-up towel firmly against the incision when you cough. This can help to reduce pain while taking deep breaths and coughing. General tips When you are able to get out of bed: Walk around often. Continue to take deep breaths and cough in order to clear your lungs. Keep using the incentive spirometer until your health care provider says it is okay to stop using it. If you have been in the hospital, you Newbold be told to keep using the spirometer at home. Contact a health care provider if: You are having difficulty using the spirometer. You have trouble using the spirometer as often as instructed. Your pain medicine is not giving enough relief for you to use the spirometer as told. You have a fever. Get help right away  if: You develop shortness of breath. You develop a cough with bloody mucus from the lungs. You have fluid or blood coming from an incision site after you cough. Summary An incentive spirometer is a tool that can help you learn to take long, deep breaths to keep your lungs clear and active. You Albers be asked to use a spirometer after a surgery, if you have a lung problem or a history of smoking, or if you have been inactive for a long period of time. Use your incentive spirometer as instructed every 1-2 hours while you are awake. If you have an incision on your chest or abdomen, place a pillow or a rolled-up towel firmly against your incision when you cough. This will help to reduce pain. Get help right away if you have shortness of breath, you cough up bloody mucus, or blood comes from your incision when you cough. This information is not intended to replace advice given to you by your health care provider. Make sure you discuss any questions you have with your health care provider. Document Revised: 08/15/2019 Document Reviewed: 08/15/2019 Elsevier Patient Education  2023 [employer].

## 2023-03-02 ENCOUNTER — Inpatient Hospital Stay: Admission: RE | Admit: 2023-03-02 | Payer: Medicaid Other | Source: Ambulatory Visit

## 2023-03-03 ENCOUNTER — Encounter
Admission: RE | Admit: 2023-03-03 | Discharge: 2023-03-03 | Disposition: A | Discharge status: Home / Self Care | Payer: Medicaid Other | Source: Ambulatory Visit | Attending: Obstetrics and Gynecology | Admitting: Obstetrics and Gynecology

## 2023-03-03 DIAGNOSIS — N809 Endometriosis, unspecified: Secondary | ICD-10-CM | POA: Diagnosis not present

## 2023-03-03 DIAGNOSIS — Z01812 Encounter for preprocedural laboratory examination: Secondary | ICD-10-CM | POA: Insufficient documentation

## 2023-03-03 DIAGNOSIS — Z3009 Encounter for other general counseling and advice on contraception: Secondary | ICD-10-CM

## 2023-03-03 LAB — BASIC METABOLIC PANEL
Anion gap: 9 (ref 5–15)
BUN: 9 mg/dL (ref 6–20)
CO2: 22 mmol/L (ref 22–32)
Calcium: 8.7 mg/dL — ABNORMAL LOW (ref 8.9–10.3)
Chloride: 107 mmol/L (ref 98–111)
Creatinine, Ser: 0.8 mg/dL (ref 0.44–1.00)
GFR, Estimated: >60 mL/min (ref >60)
Glucose, Bld: 104 mg/dL — ABNORMAL HIGH (ref 70–99)
Potassium: 3.7 mmol/L (ref 3.5–5.1)
Sodium: 138 mmol/L (ref 135–145)

## 2023-03-03 LAB — CBC
HCT: 41.1 % (ref 36.0–46.0)
Hemoglobin: 13.2 g/dL (ref 12.0–15.0)
MCH: 26.4 pg (ref 26.0–34.0)
MCHC: 32.1 g/dL (ref 30.0–36.0)
MCV: 82.2 fL (ref 80.0–100.0)
Platelets: 217 10*3/uL (ref 150–400)
RBC: 5 MIL/uL (ref 3.87–5.11)
RDW: 13.2 % (ref 11.5–15.5)
WBC: 5.3 10*3/uL (ref 4.0–10.5)
nRBC: 0 % (ref 0.0–0.2)

## 2023-03-05 MED ORDER — LACTATED RINGERS IV SOLN: INTRAVENOUS | Status: DC

## 2023-03-05 MED ORDER — FAMOTIDINE 20 MG PO TABS
20.0000 mg | ORAL_TABLET | Freq: Once | ORAL | Status: AC
  Administered 2023-03-06: 20 mg via ORAL

## 2023-03-05 MED ORDER — CELECOXIB 200 MG PO CAPS
400.0000 mg | ORAL_CAPSULE | ORAL | Status: AC
  Administered 2023-03-06: 400 mg via ORAL

## 2023-03-05 MED ORDER — GABAPENTIN 300 MG PO CAPS: 300.0000 mg | ORAL_CAPSULE | ORAL | Status: DC

## 2023-03-05 MED ORDER — POVIDONE-IODINE 10 % EX SWAB
2.0000 | Freq: Once | CUTANEOUS | Status: AC
  Administered 2023-03-06: 2 via TOPICAL

## 2023-03-05 MED ORDER — CHLORHEXIDINE GLUCONATE 0.12 % MT SOLN
15.0000 mL | Freq: Once | OROMUCOSAL | Status: AC
  Administered 2023-03-06: 15 mL via OROMUCOSAL

## 2023-03-05 MED ORDER — ORAL CARE MOUTH RINSE: 15.0000 mL | Freq: Once | OROMUCOSAL | Status: AC

## 2023-03-05 MED ORDER — ACETAMINOPHEN 500 MG PO TABS
1000.0000 mg | ORAL_TABLET | ORAL | Status: AC
  Administered 2023-03-06: 1000 mg via ORAL

## 2023-03-06 ENCOUNTER — Other Ambulatory Visit: Payer: Self-pay

## 2023-03-06 ENCOUNTER — Ambulatory Visit: Payer: Medicaid Other | Admitting: Certified Registered"

## 2023-03-06 ENCOUNTER — Ambulatory Visit
Admission: RE | Admit: 2023-03-06 | Discharge: 2023-03-06 | Disposition: A | Discharge status: Home / Self Care | Payer: Medicaid Other | Attending: Obstetrics and Gynecology | Admitting: Obstetrics and Gynecology

## 2023-03-06 ENCOUNTER — Ambulatory Visit: Payer: Medicaid Other | Admitting: Urgent Care

## 2023-03-06 ENCOUNTER — Encounter
Admission: RE | Disposition: A | Discharge status: Home / Self Care | Payer: Self-pay | Source: Home / Self Care | Attending: Obstetrics and Gynecology

## 2023-03-06 ENCOUNTER — Encounter: Payer: Self-pay | Admitting: Obstetrics and Gynecology

## 2023-03-06 DIAGNOSIS — Z3009 Encounter for other general counseling and advice on contraception: Secondary | ICD-10-CM

## 2023-03-06 DIAGNOSIS — G8929 Other chronic pain: Secondary | ICD-10-CM | POA: Diagnosis not present

## 2023-03-06 DIAGNOSIS — Z302 Encounter for sterilization: Secondary | ICD-10-CM | POA: Diagnosis present

## 2023-03-06 DIAGNOSIS — Z79899 Other long term (current) drug therapy: Secondary | ICD-10-CM | POA: Insufficient documentation

## 2023-03-06 DIAGNOSIS — Z87891 Personal history of nicotine dependence: Secondary | ICD-10-CM | POA: Insufficient documentation

## 2023-03-06 DIAGNOSIS — N838 Other noninflammatory disorders of ovary, fallopian tube and broad ligament: Secondary | ICD-10-CM | POA: Diagnosis not present

## 2023-03-06 DIAGNOSIS — Z9049 Acquired absence of other specified parts of digestive tract: Secondary | ICD-10-CM | POA: Diagnosis not present

## 2023-03-06 DIAGNOSIS — K219 Gastro-esophageal reflux disease without esophagitis: Secondary | ICD-10-CM | POA: Insufficient documentation

## 2023-03-06 DIAGNOSIS — N80329 Endometriosis of the posterior cul-de-sac, unspecified depth: Secondary | ICD-10-CM | POA: Diagnosis present

## 2023-03-06 DIAGNOSIS — Z01812 Encounter for preprocedural laboratory examination: Secondary | ICD-10-CM

## 2023-03-06 DIAGNOSIS — N809 Endometriosis, unspecified: Secondary | ICD-10-CM

## 2023-03-06 HISTORY — PX: XI ROBOT ASSISTED DIAGNOSTIC LAPAROSCOPY: SHX6815

## 2023-03-06 LAB — POCT PREGNANCY, URINE: Preg Test, Ur: NEGATIVE

## 2023-03-06 SURGERY — LAPAROSCOPY, DIAGNOSTIC, ROBOT-ASSISTED
Anesthesia: General | Laterality: Bilateral

## 2023-03-06 MED ORDER — CHLORHEXIDINE GLUCONATE 0.12 % MT SOLN
OROMUCOSAL | Status: AC
  Filled 2023-03-06: qty 15

## 2023-03-06 MED ORDER — DOCUSATE SODIUM 100 MG PO CAPS: 100.0000 mg | ORAL_CAPSULE | Freq: Two times a day (BID) | ORAL | 0 refills | Status: DC

## 2023-03-06 MED ORDER — BUPIVACAINE HCL (PF) 0.5 % IJ SOLN
INTRAMUSCULAR | Status: AC
  Filled 2023-03-06: qty 30

## 2023-03-06 MED ORDER — DEXAMETHASONE SODIUM PHOSPHATE 10 MG/ML IJ SOLN
INTRAMUSCULAR | Status: DC | PRN
  Administered 2023-03-06: 10 mg via INTRAVENOUS

## 2023-03-06 MED ORDER — BUPIVACAINE HCL (PF) 0.5 % IJ SOLN
INTRAMUSCULAR | Status: DC | PRN
  Administered 2023-03-06: 8 mL

## 2023-03-06 MED ORDER — OXYCODONE HCL 5 MG PO TABS
ORAL_TABLET | ORAL | Status: AC
  Filled 2023-03-06: qty 1

## 2023-03-06 MED ORDER — ONDANSETRON HCL 4 MG/2ML IJ SOLN
INTRAMUSCULAR | Status: AC
  Filled 2023-03-06: qty 2

## 2023-03-06 MED ORDER — PROPOFOL 10 MG/ML IV BOLUS
INTRAVENOUS | Status: AC
  Filled 2023-03-06: qty 20

## 2023-03-06 MED ORDER — OXYCODONE HCL 5 MG PO TABS
5.0000 mg | ORAL_TABLET | ORAL | 0 refills | Status: DC | PRN
Start: 2023-03-06 — End: 2023-09-03

## 2023-03-06 MED ORDER — MIDAZOLAM HCL 2 MG/2ML IJ SOLN
INTRAMUSCULAR | Status: AC
  Filled 2023-03-06: qty 2

## 2023-03-06 MED ORDER — SEVOFLURANE IN SOLN
RESPIRATORY_TRACT | Status: AC
  Filled 2023-03-06: qty 250

## 2023-03-06 MED ORDER — FAMOTIDINE 20 MG PO TABS
ORAL_TABLET | ORAL | Status: AC
  Filled 2023-03-06: qty 1

## 2023-03-06 MED ORDER — KETAMINE HCL 50 MG/5ML IJ SOSY
PREFILLED_SYRINGE | INTRAMUSCULAR | Status: AC
  Filled 2023-03-06: qty 5

## 2023-03-06 MED ORDER — CELECOXIB 200 MG PO CAPS
ORAL_CAPSULE | ORAL | Status: AC
  Filled 2023-03-06: qty 2

## 2023-03-06 MED ORDER — ONDANSETRON HCL 4 MG/2ML IJ SOLN
INTRAMUSCULAR | Status: DC | PRN
  Administered 2023-03-06: 4 mg via INTRAVENOUS

## 2023-03-06 MED ORDER — GABAPENTIN 300 MG PO CAPS
ORAL_CAPSULE | ORAL | Status: AC
  Filled 2023-03-06: qty 1

## 2023-03-06 MED ORDER — EPHEDRINE SULFATE-NACL 50-0.9 MG/10ML-% IV SOSY
PREFILLED_SYRINGE | INTRAVENOUS | Status: DC | PRN
  Administered 2023-03-06: 5 mg via INTRAVENOUS

## 2023-03-06 MED ORDER — LIDOCAINE HCL (CARDIAC) PF 100 MG/5ML IV SOSY
PREFILLED_SYRINGE | INTRAVENOUS | Status: DC | PRN
  Administered 2023-03-06: 80 mg via INTRAVENOUS

## 2023-03-06 MED ORDER — IBUPROFEN 800 MG PO TABS: 800.0000 mg | ORAL_TABLET | Freq: Three times a day (TID) | ORAL | 1 refills | Status: AC

## 2023-03-06 MED ORDER — GLYCOPYRROLATE 0.2 MG/ML IJ SOLN
INTRAMUSCULAR | Status: DC | PRN
  Administered 2023-03-06: .2 mg via INTRAVENOUS

## 2023-03-06 MED ORDER — KETAMINE HCL 50 MG/5ML IJ SOSY
PREFILLED_SYRINGE | INTRAMUSCULAR | Status: DC | PRN
Start: 2023-03-06 — End: 2023-03-06
  Administered 2023-03-06 (×2): 10 mg via INTRAVENOUS
  Administered 2023-03-06: 30 mg via INTRAVENOUS

## 2023-03-06 MED ORDER — FENTANYL CITRATE (PF) 100 MCG/2ML IJ SOLN
INTRAMUSCULAR | Status: AC
  Filled 2023-03-06: qty 2

## 2023-03-06 MED ORDER — ACETAMINOPHEN 500 MG PO TABS
ORAL_TABLET | ORAL | Status: AC
  Filled 2023-03-06: qty 2

## 2023-03-06 MED ORDER — OXYCODONE HCL 5 MG PO TABS
5.0000 mg | ORAL_TABLET | Freq: Once | ORAL | Status: AC
  Administered 2023-03-06: 5 mg via ORAL

## 2023-03-06 MED ORDER — SUGAMMADEX SODIUM 200 MG/2ML IV SOLN
INTRAVENOUS | Status: DC | PRN
  Administered 2023-03-06: 20 mg via INTRAVENOUS

## 2023-03-06 MED ORDER — MIDAZOLAM HCL 2 MG/2ML IJ SOLN
INTRAMUSCULAR | Status: DC | PRN
  Administered 2023-03-06: 2 mg via INTRAVENOUS

## 2023-03-06 MED ORDER — ROCURONIUM BROMIDE 100 MG/10ML IV SOLN
INTRAVENOUS | Status: DC | PRN
  Administered 2023-03-06: 20 mg via INTRAVENOUS
  Administered 2023-03-06: 30 mg via INTRAVENOUS
  Administered 2023-03-06: 50 mg via INTRAVENOUS

## 2023-03-06 MED ORDER — KETOROLAC TROMETHAMINE 30 MG/ML IJ SOLN
INTRAMUSCULAR | Status: AC
  Filled 2023-03-06: qty 1

## 2023-03-06 MED ORDER — ACETAMINOPHEN EXTRA STRENGTH 500 MG PO TABS: 1000.0000 mg | ORAL_TABLET | Freq: Four times a day (QID) | ORAL | 0 refills | Status: AC

## 2023-03-06 MED ORDER — DEXMEDETOMIDINE HCL IN NACL 80 MCG/20ML IV SOLN
INTRAVENOUS | Status: DC | PRN
Start: 2023-03-06 — End: 2023-03-06
  Administered 2023-03-06 (×3): 4 ug via INTRAVENOUS

## 2023-03-06 MED ORDER — FENTANYL CITRATE (PF) 100 MCG/2ML IJ SOLN
INTRAMUSCULAR | Status: DC | PRN
  Administered 2023-03-06 (×2): 50 ug via INTRAVENOUS

## 2023-03-06 MED ORDER — FENTANYL CITRATE (PF) 100 MCG/2ML IJ SOLN
25.0000 ug | INTRAMUSCULAR | Status: DC | PRN
  Administered 2023-03-06 (×4): 25 ug via INTRAVENOUS

## 2023-03-06 MED ORDER — KETOROLAC TROMETHAMINE 30 MG/ML IJ SOLN
30.0000 mg | Freq: Once | INTRAMUSCULAR | Status: AC
  Administered 2023-03-06: 30 mg via INTRAVENOUS

## 2023-03-06 MED ORDER — PROPOFOL 10 MG/ML IV BOLUS
INTRAVENOUS | Status: DC | PRN
  Administered 2023-03-06: 50 mg via INTRAVENOUS
  Administered 2023-03-06: 150 mg via INTRAVENOUS

## 2023-03-06 MED ORDER — ONDANSETRON HCL 4 MG/2ML IJ SOLN
4.0000 mg | Freq: Once | INTRAMUSCULAR | Status: AC | PRN
  Administered 2023-03-06: 4 mg via INTRAVENOUS

## 2023-03-06 SURGICAL SUPPLY — 62 items
ADH SKN CLS APL DERMABOND .7 (GAUZE/BANDAGES/DRESSINGS) ×1
APL SRG 38 LTWT LNG FL B (MISCELLANEOUS)
APPLICATOR ARISTA FLEXITIP XL (MISCELLANEOUS) IMPLANT
BAG DRN RND TRDRP ANRFLXCHMBR (UROLOGICAL SUPPLIES) ×1
BAG URINE DRAIN 2000ML AR STRL (UROLOGICAL SUPPLIES) ×1 IMPLANT
BLADE SURG SZ11 CARB STEEL (BLADE) ×1 IMPLANT
CATH FOLEY 2WAY 5CC 16FR (CATHETERS) ×1
CATH URTH 16FR FL 2W BLN LF (CATHETERS) ×1 IMPLANT
COUNTER NEEDLE 20/40 LG (NEEDLE) ×1 IMPLANT
COVER TIP SHEARS 8 DVNC (MISCELLANEOUS) ×1 IMPLANT
COVER WAND RF STERILE (DRAPES) ×1 IMPLANT
DERMABOND ADVANCED .7 DNX12 (GAUZE/BANDAGES/DRESSINGS) ×1 IMPLANT
DRAPE ARM DVNC X/XI (DISPOSABLE) ×3 IMPLANT
DRAPE COLUMN DVNC XI (DISPOSABLE) ×1 IMPLANT
DRAPE ROBOT W/ LEGGING 30X125 (DRAPES) ×1 IMPLANT
DRAPE SHEET LG 3/4 BI-LAMINATE (DRAPES) ×1 IMPLANT
ELECT REM PT RETURN 9FT ADLT (ELECTROSURGICAL) ×1
ELECTRODE REM PT RTRN 9FT ADLT (ELECTROSURGICAL) ×1 IMPLANT
FORCEPS BPLR FENES DVNC XI (FORCEP) ×1 IMPLANT
GLOVE BIO SURGEON STRL SZ7 (GLOVE) ×4 IMPLANT
GLOVE INDICATOR 7.5 STRL GRN (GLOVE) ×4 IMPLANT
GOWN STRL REUS W/ TWL LRG LVL3 (GOWN DISPOSABLE) ×4 IMPLANT
GOWN STRL REUS W/TWL LRG LVL3 (GOWN DISPOSABLE) ×4
GRASPER LAPSCPC 5X45 DSP (INSTRUMENTS) IMPLANT
GRASPER SUT TROCAR 14GX15 (MISCELLANEOUS) ×1 IMPLANT
HEMOSTAT ARISTA ABSORB 3G PWDR (HEMOSTASIS) IMPLANT
IRRIGATION STRYKERFLOW (MISCELLANEOUS) IMPLANT
IRRIGATOR STRYKERFLOW (MISCELLANEOUS) ×1
IV NS 1000ML (IV SOLUTION)
IV NS 1000ML BAXH (IV SOLUTION) IMPLANT
KIT PINK PAD W/HEAD ARE REST (MISCELLANEOUS) ×1
KIT PINK PAD W/HEAD ARM REST (MISCELLANEOUS) ×1 IMPLANT
LABEL OR SOLS (LABEL) ×1 IMPLANT
LIGASURE LAP MARYLAND 5MM 37CM (ELECTROSURGICAL) IMPLANT
MANIFOLD NEPTUNE II (INSTRUMENTS) ×1 IMPLANT
MANIPULATOR UTERINE 4.5 ZUMI (MISCELLANEOUS) ×1 IMPLANT
NDL DRIVE SUT CUT DVNC (INSTRUMENTS) ×1 IMPLANT
NEEDLE DRIVE SUT CUT DVNC (INSTRUMENTS) ×1
NS IRRIG 1000ML POUR BTL (IV SOLUTION) ×1 IMPLANT
OBTURATOR OPTICAL STND 8 DVNC (TROCAR) ×1
OBTURATOR OPTICALSTD 8 DVNC (TROCAR) ×1 IMPLANT
PACK GYN LAPAROSCOPIC (MISCELLANEOUS) ×1 IMPLANT
PAD OB MATERNITY 4.3X12.25 (PERSONAL CARE ITEMS) ×1 IMPLANT
PAD PREP OB/GYN DISP 24X41 (PERSONAL CARE ITEMS) ×1 IMPLANT
SCISSORS MNPLR CVD DVNC XI (INSTRUMENTS) ×1 IMPLANT
SCRUB CHG 4% DYNA-HEX 4OZ (MISCELLANEOUS) ×1 IMPLANT
SEAL UNIV 5-12 XI (MISCELLANEOUS) ×3 IMPLANT
SEALER VESSEL EXT DVNC XI (MISCELLANEOUS) IMPLANT
SET CYSTO W/LG BORE CLAMP LF (SET/KITS/TRAYS/PACK) IMPLANT
SET TUBE SMOKE EVAC HIGH FLOW (TUBING) ×1 IMPLANT
SOL ELECTROSURG ANTI STICK (MISCELLANEOUS) ×1
SOL PREP PVP 2OZ (MISCELLANEOUS) ×1
SOLUTION ELECTROSURG ANTI STCK (MISCELLANEOUS) ×1 IMPLANT
SOLUTION PREP PVP 2OZ (MISCELLANEOUS) ×1 IMPLANT
SURGILUBE 2OZ TUBE FLIPTOP (MISCELLANEOUS) ×1 IMPLANT
SUT MNCRL 4-0 (SUTURE) ×2
SUT MNCRL 4-0 27XMFL (SUTURE) ×2
SUT VIC AB 0 CT2 27 (SUTURE) ×2 IMPLANT
SUT VLOC 90 2/L VL 12 GS22 (SUTURE) IMPLANT
SUTURE MNCRL 4-0 27XMF (SUTURE) ×1 IMPLANT
TROCAR Z-THREAD FIOS 5X100MM (TROCAR) IMPLANT
WATER STERILE IRR 500ML POUR (IV SOLUTION) ×1 IMPLANT

## 2023-03-06 NOTE — Anesthesia Postprocedure Evaluation (Signed)
Anesthesia Post Note  Patient: [name]  Procedure(s) Performed: XI ROBOT ASSISTED DIAGNOSTIC LAPAROSCOPY, EXCISION OF ENDOMETRIOSIS, BILATERAL TUBAL LIGATION, LYSIS OF ADHESIONS (Bilateral)  Patient location during evaluation: PACU Anesthesia Type: General Level of consciousness: awake Pain management: satisfactory to patient Vital Signs Assessment: post-procedure vital signs reviewed and stable Respiratory status: spontaneous breathing Cardiovascular status: stable Anesthetic complications: no   No notable events documented.   Last Vitals:  Vitals:   03/06/23 1110 03/06/23 1115  BP:  (!) 85/48  Pulse: 83 74  Resp: 15 16  Temp:    SpO2: 100% 97%    Last Pain:  Vitals:   03/06/23 1115  TempSrc:   PainSc: Asleep                 VAN STAVEREN,Gregary Blackard

## 2023-03-06 NOTE — Anesthesia Procedure Notes (Signed)
Procedure Name: Intubation Date/Time: 03/06/2023 7:39 AM  Performed by: Cheral Bay, CRNAPre-anesthesia Checklist: Patient identified, Emergency Drugs available, Suction available and Patient being monitored Patient Re-evaluated:Patient Re-evaluated prior to induction Oxygen Delivery Method: Circle system utilized Preoxygenation: Pre-oxygenation with 100% oxygen Induction Type: IV induction Ventilation: Mask ventilation without difficulty Laryngoscope Size: McGraph and 3 Grade View: Grade I Tube type: Oral Number of attempts: 1 Airway Equipment and Method: Stylet Placement Confirmation: ETT inserted through vocal cords under direct vision, positive ETCO2 and breath sounds checked- equal and bilateral Secured at: 20 cm Tube secured with: Tape Dental Injury: Teeth and Oropharynx as per pre-operative assessment

## 2023-03-06 NOTE — Anesthesia Preprocedure Evaluation (Signed)
Anesthesia Evaluation  Patient identified by MRN, date of birth, ID band Patient awake    Reviewed: Allergy & Precautions, NPO status , Patient's Chart, lab work & pertinent test results  Airway Mallampati: II  TM Distance: >3 FB Neck ROM: Full    Dental  (+) Teeth Intact Braces:   Pulmonary neg pulmonary ROS, Patient abstained from smoking., former smoker   Pulmonary exam normal        Cardiovascular Exercise Tolerance: Good negative cardio ROS Normal cardiovascular exam Rhythm:Regular Rate:Normal     Neuro/Psych  Headaches  Anxiety     negative neurological ROS  negative psych ROS   GI/Hepatic negative GI ROS, Neg liver ROS,GERD  Medicated,,  Endo/Other  negative endocrine ROS    Renal/GU negative Renal ROS     Musculoskeletal   Abdominal Normal abdominal exam  (+)   Peds negative pediatric ROS (+)  Hematology negative hematology ROS (+) Blood dyscrasia, anemia   Anesthesia Other Findings Past Medical History: No date: Anemia No date: Anxiety 01/10/2015: Bladder infection, acute     Comment:  completed antibiotics and denies any s&s of infection               (01-30-15) No date: Depression No date: Dysrhythmia     Comment:  occ irregular heart beat per pt 01/29/2015: Gallstones No date: GERD (gastroesophageal reflux disease)     Comment:  NO MEDS No date: Headache     Comment:  MIGRAINES DAILY No date: POTS (postural orthostatic tachycardia syndrome)  Past Surgical History: No date: ADENOIDECTOMY 02/01/2015: CHOLECYSTECTOMY; N/A     Comment:  Procedure: LAPAROSCOPIC CHOLECYSTECTOMY WITH               INTRAOPERATIVE CHOLANGIOGRAM;  Surgeon: Earline Mayotte, MD;  Location: [facility] ORS;  Service: General;                Laterality: N/A; No date: CHOLECYSTECTOMY 04/19/2021: CYSTOSCOPY W/ RETROGRADES; Left     Comment:  Procedure: CYSTOSCOPY WITH RETROGRADE PYELOGRAM;                Surgeon:  Christeen Douglas, MD;  Location: [facility] ORS;                Service: Gynecology;  Laterality: Left; No date: TONSILLECTOMY No date: WISDOM TOOTH EXTRACTION 04/19/2021: XI ROBOT ASSISTED DIAGNOSTIC LAPAROSCOPY; N/A     Comment:  Procedure: XI ROBOT ASSISTED DIAGNOSTIC LAPAROSCOPY,               EXCISION OF ENDOMETRIOSIS, LYSIS OF ADHESIONS;  Surgeon:               Christeen Douglas, MD;  Location: [facility] ORS;  Service:               Gynecology;  Laterality: N/A;  BMI    Body Mass Index: 31.16 kg/m      Reproductive/Obstetrics negative OB ROS                             Anesthesia Physical Anesthesia Plan  ASA: 2  Anesthesia Plan: General   Post-op Pain Management:    Induction: Intravenous  PONV Risk Score and Plan: 1 and Ondansetron and Dexamethasone  Airway Management Planned: Oral ETT  Additional Equipment:   Intra-op Plan:   Post-operative Plan: Extubation in OR  Informed Consent: I have reviewed the  patients History and Physical, chart, labs and discussed the procedure including the risks, benefits and alternatives for the proposed anesthesia with the patient or authorized representative who has indicated his/her understanding and acceptance.     Dental Advisory Given  Plan Discussed with: CRNA and Surgeon  Anesthesia Plan Comments:        Anesthesia Quick Evaluation

## 2023-03-06 NOTE — Op Note (Signed)
Oreatha M Lyles PROCEDURE DATE: 03/06/2023  PREOPERATIVE DIAGNOSIS: Endometriosis  POSTOPERATIVE DIAGNOSIS: Stage III-IV endometriosis PROCEDURE:  XI ROBOT ASSISTED DIAGNOSTIC LAPAROSCOPY, EXCISION OF ENDOMETRIOSIS, BILATERAL TUBAL LIGATION, LYSIS OF ADHESIONS: 56387 (CPT) Right extensive ureterolysis with right peritonenectomy Left partial ureterolysis Posterior cul-de-sac lysis of adhesions and full peritonectomy  SURGEON:  Dr. Christeen Douglas ASSISTANT: Dr. Thomasene Mohair ANESTHESIOLOGIST: Darleene Cleaver, Gerrit Heck, MD Anesthesiologist: Darleene Cleaver, Gerrit Heck, MD CRNA: Elmarie Mainland, CRNA; Reynolds, Sherri L, CRNA  INDICATIONS: 27 y.o. [phone] with history of chronic pelvic pain desiring surgical evaluation.   Please see preoperative notes for further details.  Risks of surgery were discussed with the patient including but not limited to: bleeding which Atkins require transfusion or reoperation; infection which Andrades require antibiotics; injury to bowel, bladder, ureters or other surrounding organs; need for additional procedures including laparotomy; thromboembolic phenomenon, incisional problems and other postoperative/anesthesia complications. Written informed consent was obtained.    FINDINGS: Enlarged boggy uterus suggestive of adenomyosis, normal ovaries with powder burn lesions on the serosal surface and fallopian tubes bilaterally.  Significant endometriosis, clear blebs and powder burn lesions with deep in filtrating nodules noted in the areas that were resected.  Appendix adherent to the cecum with endometriosis-like scarring.  Normal upper abdomen.  ANESTHESIA:    General INTRAVENOUS FLUIDS: 1300 ml ESTIMATED BLOOD LOSS: 10 ml URINE OUTPUT: 800 ml SPECIMENS: Left ovarian fossa, right ovarian fossa peritoneum.  Posterior cul-de-sac peritoneum with nodular endometriosis COMPLICATIONS: None immediate  PROCEDURE IN DETAIL:  The patient had sequential compression devices applied  to her lower extremities while in the preoperative area.  She was then taken to the operating room where general anesthesia was administered and was found to be adequate.  She was placed in the dorsal lithotomy position, and was prepped and draped in a sterile manner.  A Foley catheter was inserted into her bladder and attached to constant drainage and a uterine manipulator was then advanced into the uterus.  After an adequate timeout was performed, attention was turned to the abdomen where an umbilical incision was made with the scalpel.  The 8-mm robotic trocar and sleeve were then advanced without difficulty with the laparoscope under direct visualization into the abdomen.  The abdomen was then insufflated with carbon dioxide gas and adequate pneumoperitoneum was obtained.   A detailed survey of the patient's pelvis and abdomen revealed the findings as mentioned above.  2 more robotic ports were placed laterally under direct visualization.  A left sided assistant port was placed 5 mm.  The bilateral fallopian tubes were divided from their underlying vascular structures and removed to complete the bilateral salpingectomy.  After close examination of the pelvis and the upper abdomen, the left ovary was pulled out of the way.  The peritoneum was gently scarred superiorly, and with great care taken to avoid the ureter and uterine artery, both of these structures were skeletonized and the peritoneum reflected away.  Several deep infiltrating nodules were excised from this area.  Attention was turned to the right, and a more extensive ureterolysis undertaken from just below the pelvic brim to lateral to the uterosacral ligament.  The peritoneum was removed from the mesosalpinx to significantly below the ureter in the gutter.  Several deep infiltrating nodules were excised carefully from this area as well.  Attention turned to the posterior cul-de-sac, where this rectosigmoid was carefully dissected using  sharp and cautery from the posterior cul-de-sac.  The adhesions here were filmy, but significant.  The peritoneum  was entirely removed from just medial to the bilateral uterosacral ligaments, to the sigmoid.  No damage to the bowel was noted.  Bilateral ureters were found to be peristalsing throughout.  The operative site was surveyed, and it was found to be hemostatic.  No intraoperative injury to surrounding organs was noted.  3 grams of Arista were placed over all raw areas.  Pictures were taken of the quadrants and pelvis. The abdomen was desufflated and all instruments were then removed from the patient's abdomen. The uterine manipulator was removed without complications.  All incisions were closed with 4-0 monocryl and Dermabond.   The patient tolerated the procedures well.  All instruments, needles, and sponge counts were correct x 2. The patient was taken to the recovery room in stable condition.

## 2023-03-06 NOTE — Discharge Instructions (Addendum)
Laparoscopic Ovarian Surgery Discharge Instructions  For the next three days, take ibuprofen and acetaminophen on a schedule, every 8 hours. You can take them together or you can intersperse them, and take one every four hours. I also gave you gabapentin for nighttime, to help you sleep and also to control pain. Take gabapentin medicines at night for at least the next 3 nights. You also have a narcotic, oxycodone, to take as needed if the above medicines don't help.  Postop constipation is a major cause of pain. Stay well hydrated, walk as you tolerate, and take over the counter senna as well as stool softeners if you need them.   RISKS AND COMPLICATIONS  Infection. Bleeding. Injury to surrounding organs. Anesthetic side effects.   PROCEDURE  You  be given a medicine to help you relax (sedative) before the procedure. You will be given a medicine to make you sleep (general anesthetic) during the procedure. A tube will be put down your throat to help your breath while under general anesthesia. Several small cuts (incisions) are made in the lower abdominal area and one incision is made near the belly button. Your abdominal area will be inflated with a safe gas (carbon dioxide). This helps give the surgeon room to operate, visualize, and helps the surgeon avoid other organs. A thin, lighted tube (laparoscope) with a camera attached is inserted into your abdomen through the incision near the belly button. Other small instruments  also be inserted through other abdominal incisions. The ovary is located and are removed. After the ovary is removed, the gas is released from the abdomen. The incisions will be closed with stitches (sutures), and Dermabond. A bandage  be placed over the incisions.  AFTER THE PROCEDURE  You will also have some mild abdominal discomfort for 3-7 days. You will be given pain medicine to ease any discomfort. As long as there are no problems, you  be allowed to  go home. Someone will need to drive you home and be with you for at least 24 hours once home. You  have some mild discomfort in the throat. This is from the tube placed in your throat while you were sleeping. You  experience discomfort in the shoulder area from some trapped air between the liver and diaphragm. This sensation is normal and will slowly go away on its own.  HOME CARE INSTRUCTIONS  Take all medicines as directed. Only take over-the-counter or prescription medicines for pain, discomfort, or fever as directed by your caregiver. Resume daily activities as directed. Showers are preferred over baths for 2 weeks. You  resume sexual activities in 1 week or as you feel you would like to. Do not drive while taking narcotics.  SEEK MEDICAL CARE IF: . There is increasing abdominal pain. You feel lightheaded or faint. You have the chills. You have an oral temperature above 102 F (38.9 C). There is pus-like (purulent) drainage from any of the wounds. You are unable to pass gas or have a bowel movement. You feel sick to your stomach (nauseous) or throw up (vomit) and can't control it with your medicines.  MAKE SURE YOU:  Understand these instructions. Will watch your condition. Will get help right away if you are not doing well or get worse.  Exit[employer] Patient Information 2013 [employer]     AMBULATORY SURGERY  DISCHARGE INSTRUCTIONS   The drugs that you were given will stay in your system until tomorrow so for the next 24 hours you should not:    Drive an automobile Make any legal decisions Drink any alcoholic beverage   You  resume regular meals tomorrow.  Today it is better to start with liquids and gradually work up to solid foods.  You  eat anything you prefer, but it is better to start with liquids, then soup and crackers, and gradually work up to solid foods.   Please notify your doctor immediately if you have any unusual bleeding, trouble  breathing, redness and pain at the surgery site, drainage, fever, or pain not relieved by medication.    Additional Instructions:   Please contact your physician with any problems or Same Day Surgery at [phone], Monday through Friday 6 am to 4 pm, or [facility] [place] Main number at [phone].  

## 2023-03-06 NOTE — Transfer of Care (Signed)
Immediate Anesthesia Transfer of Care Note  Patient: [name]  Procedure(s) Performed: XI ROBOT ASSISTED DIAGNOSTIC LAPAROSCOPY, EXCISION OF ENDOMETRIOSIS, BILATERAL TUBAL LIGATION, LYSIS OF ADHESIONS (Bilateral)  Patient Location: PACU  Anesthesia Type:General  Level of Consciousness: awake  Airway & Oxygen Therapy: Patient Spontanous Breathing  Post-op Assessment: Report given to RN and Post -op Vital signs reviewed and stable  Post vital signs: Reviewed and stable  Last Vitals:  Vitals Value Taken Time  BP 93/60 03/06/23 1030  Temp    Pulse 83 03/06/23 1032  Resp 18 03/06/23 1032  SpO2 100 % 03/06/23 1032  Vitals shown include unfiled device data.  Last Pain:  Vitals:   03/06/23 0627  TempSrc: Temporal  PainSc: 0-No pain         Complications: No notable events documented.

## 2023-03-06 NOTE — Interval H&P Note (Signed)
History and Physical Interval Note:  03/06/2023 6:30 AM  [name]  has presented today for surgery, with the diagnosis of undesired fertility, chronic pelvic pain.  The various methods of treatment have been discussed with the patient and family. After consideration of risks, benefits and other options for treatment, the patient has consented to  Procedure(s): XI ROBOT ASSISTED DIAGNOSTIC LAPAROSCOPY, EXCISION OF ENDOMETRIOSIS, BILATERAL TUBAL LIGATION, LYSIS OF ADHESIONS (Bilateral) as a surgical intervention.  The patient's history has been reviewed, patient examined, no change in status, stable for surgery.  I have reviewed the patient's chart and labs.  Questions were answered to the patient's satisfaction.     Christeen Douglas

## 2023-03-07 ENCOUNTER — Encounter: Payer: Self-pay | Admitting: Obstetrics and Gynecology

## 2023-03-10 LAB — SURGICAL PATHOLOGY

## 2023-03-16 ENCOUNTER — Encounter: Payer: Medicaid Other | Admitting: Physical Therapy

## 2023-03-19 ENCOUNTER — Ambulatory Visit
Admission: EM | Admit: 2023-03-19 | Discharge: 2023-03-19 | Disposition: A | Discharge status: Home / Self Care | Payer: Medicaid Other | Attending: Family Medicine | Admitting: Family Medicine

## 2023-03-19 DIAGNOSIS — N3000 Acute cystitis without hematuria: Secondary | ICD-10-CM | POA: Insufficient documentation

## 2023-03-19 LAB — URINALYSIS, W/ REFLEX TO CULTURE (INFECTION SUSPECTED)
Bilirubin Urine: NEGATIVE
Glucose, UA: NEGATIVE mg/dL
Hgb urine dipstick: NEGATIVE
Ketones, ur: NEGATIVE mg/dL
Nitrite: NEGATIVE
Protein, ur: NEGATIVE mg/dL
Specific Gravity, Urine: 1.025 (ref 1.005–1.030)
pH: 6 (ref 5.0–8.0)

## 2023-03-19 MED ORDER — NITROFURANTOIN MONOHYD MACRO 100 MG PO CAPS: 100.0000 mg | ORAL_CAPSULE | Freq: Two times a day (BID) | ORAL | 0 refills | Status: DC

## 2023-03-19 NOTE — ED Triage Notes (Signed)
Constant right side pain. Patient had surgery 2 weeks ago and is unsure if its coming from that.

## 2023-03-19 NOTE — ED Provider Notes (Signed)
[facility]    CSN: [id number] Arrival date & time: 03/19/23  0902      History   Chief Complaint No chief complaint on file.    HPI HPI [name] is a 27 y.o. female.    [name] Zoll presents for right flank pain that is intermittent and can be sharp that started yesterday.  Has had one kidney stone before.   Has right-sided low back pain, urinary frequency and urgency. No dysuria, hematuria, fever, nausea, vomiting or diarrhea.  She is not concerned for STIs.  She had surgery two weeks for tubal and endometriosis.     Past Medical History:  Diagnosis Date   Anemia    Anxiety    Bladder infection, acute 01/10/2015   completed antibiotics and denies any s&s of infection (01-30-15)   Depression    Dysrhythmia    occ irregular heart beat per pt   Gallstones 01/29/2015   GERD (gastroesophageal reflux disease)    NO MEDS   Headache    MIGRAINES DAILY   POTS (postural orthostatic tachycardia syndrome)     Patient Active Problem List   Diagnosis Date Noted   POTS (postural orthostatic tachycardia syndrome) 07/18/2022   Positive GBS test 07/18/2022   Susceptible to Varicella (non-immune), currently pregnant in third trimester 07/18/2022   Rh negative status during pregnancy in third trimester 07/18/2022   Pelvic pain in pregnancy 06-15-19   Palpitations 06/07/2018   Atypical chest pain 06/07/2018   Anxiety 06/07/2018   Syncope 04/14/2018   Vitamin D insufficiency 07/08/2016   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   Panic disorder 12/15/2014    Past Surgical History:  Procedure Laterality Date   ADENOIDECTOMY     CHOLECYSTECTOMY N/A 02/01/2015   Procedure: LAPAROSCOPIC CHOLECYSTECTOMY WITH INTRAOPERATIVE CHOLANGIOGRAM;  Surgeon: Earline Mayotte, MD;  Location: [facility];  Service: General;  Laterality: N/A;   CHOLECYSTECTOMY     CYSTOSCOPY W/ RETROGRADES Left 04/19/2021   Procedure: CYSTOSCOPY WITH RETROGRADE PYELOGRAM;  Surgeon: Christeen Douglas, MD;   Location: [facility];  Service: Gynecology;  Laterality: Left;   TONSILLECTOMY     WISDOM TOOTH EXTRACTION     XI ROBOT ASSISTED DIAGNOSTIC LAPAROSCOPY N/A 04/19/2021   Procedure: XI ROBOT ASSISTED DIAGNOSTIC LAPAROSCOPY, EXCISION OF ENDOMETRIOSIS, LYSIS OF ADHESIONS;  Surgeon: Christeen Douglas, MD;  Location: [facility];  Service: Gynecology;  Laterality: N/A;   XI ROBOT ASSISTED DIAGNOSTIC LAPAROSCOPY Bilateral 03/06/2023   Procedure: XI ROBOT ASSISTED DIAGNOSTIC LAPAROSCOPY, EXCISION OF ENDOMETRIOSIS, BILATERAL TUBAL LIGATION, LYSIS OF ADHESIONS;  Surgeon: Christeen Douglas, MD;  Location: [facility];  Service: Gynecology;  Laterality: Bilateral;    OB History     Gravida  4   Para  3   Term  3   Preterm  0   AB  1   Living  3      SAB  1   IAB  0   Ectopic  0   Multiple  0   Live Births  3        Obstetric Comments  1st Menstrual Cycle:  11 1st Pregnancy:  17           Home Medications    Prior to Admission medications   Medication Sig Start Date End Date Taking? Authorizing Provider  docusate sodium (COLACE) 100 MG capsule Take 1 capsule (100 mg total) by mouth 2 (two) times daily. To keep stools soft 03/06/23  Yes Christeen Douglas, MD  ibuprofen (ADVIL) 600 MG tablet Take 1  tablet (600 mg total) by mouth every 6 (six) hours as needed. 09/18/22  Yes Lamptey, Britta Mccreedy, MD  nitrofurantoin, macrocrystal-monohydrate, (MACROBID) 100 MG capsule Take 1 capsule (100 mg total) by mouth 2 (two) times daily. 03/19/23  Yes Myria Steenbergen, Seward Meth, DO  acetaminophen (TYLENOL) 500 MG tablet Take 2 tablets (1,000 mg total) by mouth every 6 (six) hours. 07/23/22   Chari Manning, CNM  benzocaine-Menthol (DERMOPLAST) 20-0.5 % AERO Apply 1 Application topically as needed for irritation (perineal discomfort). 07/23/22   Chari Manning, CNM  coconut oil OIL Apply 1 Application topically as needed. Patient not taking: Reported on 03/06/2023 07/23/22   Chari Manning, CNM  dibucaine  (NUPERCAINAL) 1 % OINT Place 1 Application rectally as needed for hemorrhoids. 07/23/22   Chari Manning, CNM  ferrous sulfate 325 (65 FE) MG tablet Take 1 tablet (325 mg total) by mouth 2 (two) times daily with a meal. Patient not taking: Reported on 03/06/2023 07/23/22   Chari Manning, CNM  methocarbamol (ROBAXIN) 500 MG tablet Take 1 tablet (500 mg total) by mouth at bedtime as needed for muscle spasms. Patient not taking: Reported on 03/06/2023 09/18/22   Merrilee Jansky, MD  oxyCODONE (OXY IR/ROXICODONE) 5 MG immediate release tablet Take 1 tablet (5 mg total) by mouth every 4 (four) hours as needed for severe pain. Patient not taking: Reported on 03/06/2023 07/23/22   Chari Manning, CNM  oxyCODONE (OXY IR/ROXICODONE) 5 MG immediate release tablet Take 1 tablet (5 mg total) by mouth every 4 (four) hours as needed for severe pain. 03/06/23   Christeen Douglas, MD  Prenatal MV-Min-Fe Cbn-FA-DHA (OBTREX DHA) 29-1 & 350 MG MISC Take 1 tablet by mouth daily. Patient not taking: Reported on 03/06/2023    [provider]  senna-docusate (SENOKOT-S) 8.6-50 MG tablet Take 2 tablets by mouth daily. Patient not taking: Reported on 03/06/2023 07/23/22   Chari Manning, CNM  simethicone (MYLICON) 80 MG chewable tablet Chew 1 tablet (80 mg total) by mouth as needed for flatulence. Patient not taking: Reported on 03/06/2023 07/23/22   Chari Manning, CNM  witch hazel-glycerin (TUCKS) pad Apply 1 Application topically as needed for hemorrhoids. Patient not taking: Reported on 03/06/2023 07/23/22   Chari Manning, CNM    Family History Family History  Problem Relation Age of Onset   Healthy Mother    Hypertension Father    Hyperlipidemia Father    Arrhythmia Father    Cancer Maternal Aunt        breast   CAD Paternal Grandmother        d. 37   CAD Paternal Grandfather        d. 61    Social History Social History   Tobacco Use   Smoking status: Former    Current  packs/day: 0.00    Average packs/day: 0.5 packs/day for 8.0 years (4.0 ttl pk-yrs)    Types: Cigarettes    Start date: 2011    Quit date: 2019    Years since quitting: 5.7   Smokeless tobacco: Never  Vaping Use   Vaping status: Never Used  Substance Use Topics   Alcohol use: Not Currently    Alcohol/week: 0.0 standard drinks of alcohol    Comment: seldom   Drug use: No     Allergies   Amoxicillin, Penicillin g, and Penicillins   Review of Systems Review of Systems: :negative unless otherwise stated in HPI.      Physical Exam Triage Vital Signs ED Triage Vitals  Encounter Vitals  Group     BP 03/19/23 0955 118/72     Systolic BP Percentile --      Diastolic BP Percentile --      Pulse Rate 03/19/23 0955 75     Resp 03/19/23 0955 17     Temp 03/19/23 0955 97.9 F (36.6 C)     Temp Source 03/19/23 0955 Oral     SpO2 03/19/23 0955 99 %     Weight --      Height --      Head Circumference --      Peak Flow --      Pain Score 03/19/23 0952 10     Pain Loc --      Pain Education --      Exclude from Growth Chart --    No data found.  Updated Vital Signs BP 118/72 (BP Location: Left Arm)   Pulse 75   Temp 97.9 F (36.6 C) (Oral)   Resp 17   LMP 02/03/2023 (Approximate) Comment: patient has falopian tubes removed.  SpO2 99%   Visual Acuity Right Eye Distance:   Left Eye Distance:   Bilateral Distance:    Right Eye Near:   Left Eye Near:    Bilateral Near:     Physical Exam GEN: uncomfortable appearing female in no acute distress  CVS: well perfused  RESP: speaking in full sentences without pause  ABD: soft, non-tender, non-distended, no palpable masses, + right CVA tenderness       UC Treatments / Results  Labs (all labs ordered are listed, but only abnormal results are displayed) Labs Reviewed  URINALYSIS, W/ REFLEX TO CULTURE (INFECTION SUSPECTED) - Abnormal; Notable for the following components:      Result Value   APPearance HAZY (*)     Leukocytes,Ua SMALL (*)    Bacteria, UA MANY (*)    All other components within normal limits    EKG   Radiology No results found.  Procedures Procedures (including critical care time)  Medications Ordered in UC Medications - No data to display  Initial Impression / Assessment and Plan / UC Course  I have reviewed the triage vital signs and the nursing notes.  Pertinent labs & imaging results that were available during my care of the patient were reviewed by me and considered in my medical decision making (see chart for details).      Patient is a 27 y.o. female with history of recurrent UTIs and kidney stones who presents for right sided flank pain, urinary frequency and urgency for the past 2 weeks.  Of note, she had a tubal ligation and an of endometriosis with lysis of adhesions formed on 03/06/2023 by Dr. Dalbert Garnet.  She has not yet contacted her surgeon about this pain.  She says she has a follow-up appointment with them on Tuesday.  Overall, patient is uncomfortable-appearing and afebrile.  Vital signs stable.  UA consistent with possible acute cystitis.  Hematuria not supported on microscopy.  She is right around in the exam room however like she has a kidney stone.  Recommended ED evaluation but she does not want to go to the ED at this time.  She states she will try to make it to her follow-up visit with her surgeon and just alert them of what is going on.  Recommended increased oral hydration to be sure that her kidney stone can pass easily if she does have a stone.  Treat acute cystitis with Macrobid for 5 days.  Return precautions including abdominal pain, fever, chills, nausea, or vomiting given. Follow-up,  if symptoms not improving or getting worse. Discussed MDM, treatment plan and plan for follow-up with patient who agrees with plan.        Final Clinical Impressions(s) / UC Diagnoses   Final diagnoses:  Acute cystitis without hematuria     Discharge  Instructions      Follow up with your surgeon, as scheduled.       ED Prescriptions     Medication Sig Dispense Auth. Provider   nitrofurantoin, macrocrystal-monohydrate, (MACROBID) 100 MG capsule Take 1 capsule (100 mg total) by mouth 2 (two) times daily. 10 capsule Katha Cabal, DO      PDMP not reviewed this encounter.   Katha Cabal, DO 03/19/23 1715

## 2023-03-19 NOTE — Discharge Instructions (Signed)
Follow up with your surgeon, as scheduled.

## 2023-08-07 ENCOUNTER — Encounter: Payer: Self-pay | Admitting: Obstetrics and Gynecology

## 2023-08-25 ENCOUNTER — Ambulatory Visit: Payer: Self-pay | Admitting: General Surgery

## 2023-08-25 NOTE — H&P (View-Only) (Signed)
 History of Present Illness [name] is a 28 year old female with endometriosis who presents for evaluation for appendectomy during surgery for endometriosis.  She has a history of severe endometriosis and has undergone two previous surgeries, the most recent being in September. Despite these interventions, her symptoms have not improved and have become more aggressive.  Her menstrual periods are irregular, starting with spotting for the first three days, followed by five days of heavy bleeding with large clots, causing her to nearly pass out due to the severity. She experiences constant pain during her periods, which improves slightly after her period ends, but she continues to have abdominal pain.  In addition to her endometriosis, she has had her fallopian tubes removed during a previous surgery. She is planning to have the surgery soon to allow for recovery before a trip to [place].      PAST MEDICAL HISTORY:  Past Medical History:  Diagnosis Date   Allergic rhinitis    Anxiety    Depression    Endometriosis of uterus    GERD (gastroesophageal reflux disease)    Migraines         PAST SURGICAL HISTORY:   Past Surgical History:  Procedure Laterality Date   CHOLECYSTECTOMY     CYSTOSCOPY     excision of endometriosis     lysis of adhesions     PELVIC LAPAROSCOPY     TONSILLECTOMY & ADENOIDECTOMY     TUBAL LIGATION     WISDOM TEETH           MEDICATIONS:  Outpatient Encounter Medications as of 08/25/2023  Medication Sig Dispense Refill   acetaminophen (TYLENOL) 325 MG tablet Take 650 mg by mouth every 4 (four) hours as needed for Pain (Patient not taking: Reported on 08/25/2023)     celecoxib (CELEBREX) 200 MG capsule  (Patient not taking: Reported on 08/06/2023)     doxylamine-pyridoxine, vit B6, (DICLEGIS) 10-10 mg DR tablet 2 Tablets ay night.  Add 1 tablet on day 3 in the morning if symptoms continue.  If still symptomatic on day 4 add 1 tablet in the afternoon. (Patient  not taking: Reported on 08/26/2022) 120 tablet 1   elagolix 150 mg Tab Take 1 tablet (150 mg total) by mouth once daily (Patient not taking: Reported on 08/06/2023) 28 tablet 11   ibuprofen (MOTRIN) 600 MG tablet Take 600 mg by mouth every 6 (six) hours (Patient not taking: Reported on 08/25/2023)     meloxicam (MOBIC) 15 MG tablet TAKE 1 TABLET BY MOUTH ONCE DAILY WITH MEALS (Patient not taking: Reported on 08/06/2023)     NIGHTTIME SLEEP-AID, DOXYLAMN, 25 mg tablet as directed (Patient not taking: Reported on 08/26/2022)     norethindrone (AYGESTIN) 5 mg tablet Take 1 tablet (5 mg total) by mouth once daily (Patient not taking: Reported on 08/06/2023) 30 tablet 11   oxyCODONE (ROXICODONE) 5 MG immediate release tablet Take 1 tablet (5 mg total) by mouth every 6 (six) hours as needed for Pain (Patient not taking: Reported on 08/06/2023) 15 tablet 0   PNV no.153/FA/om3/dha/epa/fish (PRENATAL GUMMIES ORAL) Take by mouth 2 (two) times daily (Patient not taking: Reported on 03/13/2022)     prenatal vit-CA-MIN-FE-FA (KPN ORAL) tablet Take 1 tablet by mouth once daily (Patient not taking: Reported on 08/25/2023) 30 tablet 6   VITAMIN B-6 25 MG tablet  (Patient not taking: Reported on 08/26/2022)     No facility-administered encounter medications on file as of 08/25/2023.  ALLERGIES:   Amoxicillin and Penicillin   SOCIAL HISTORY:  Social History   Socioeconomic History   Marital status: Married  Tobacco Use   Smoking status: Former   Smokeless tobacco: Never  Substance and Sexual Activity   Alcohol use: Not Currently   Drug use: No   Sexual activity: Yes    Partners: Male    Birth control/protection: Surgical   Social Drivers of Health   Financial Resource Strain: Low Risk  (08/06/2023)   Overall Financial Resource Strain (CARDIA)    Difficulty of Paying Living Expenses: Not hard at all  Food Insecurity: No Food Insecurity (08/06/2023)   Hunger Vital Sign    Worried About Running Out of Food  in the Last Year: Never true    Ran Out of Food in the Last Year: Never true  Transportation Needs: No Transportation Needs (08/06/2023)   PRAPARE - Administrator, Civil Service (Medical): No    Lack of Transportation (Non-Medical): No    FAMILY HISTORY:  Family History  Problem Relation Name Age of Onset   No Known Problems Mother     Diabetes type II Father     Heart disease Father       GENERAL REVIEW OF SYSTEMS:   General ROS: negative for - chills, fatigue, fever, weight gain or weight loss Allergy and Immunology ROS: negative for - hives  Hematological and Lymphatic ROS: negative for - bleeding problems or bruising, negative for palpable nodes Endocrine ROS: negative for - heat or cold intolerance, hair changes Respiratory ROS: negative for - cough, shortness of breath or wheezing Cardiovascular ROS: no chest pain or palpitations GI ROS: negative for nausea, vomiting, diarrhea, constipation.  Positive for abdominal pain Musculoskeletal ROS: negative for - joint swelling or muscle pain Neurological ROS: negative for - confusion, syncope Dermatological ROS: negative for pruritus and rash  PHYSICAL EXAM:  Vitals:   08/25/23 1401  BP: 127/83  Pulse: 76  .  Ht:152.4 cm (5') Wt:78.5 kg (173 lb) ZOX:WRUE surface area is 1.82 meters squared. Body mass index is 33.79 kg/m.Marland Kitchen   GENERAL: Alert, active, oriented x3  HEENT: Pupils equal reactive to light. Extraocular movements are intact. Sclera clear. Palpebral conjunctiva normal red color.Pharynx clear.  NECK: Supple with no palpable mass and no adenopathy.  LUNGS: Sound clear with no rales rhonchi or wheezes.  HEART: Regular rhythm S1 and S2 without murmur.  ABDOMEN: Soft and depressible, nontender with no palpable mass, no hepatomegaly.  EXTREMITIES: Well-developed well-nourished symmetrical with no dependent edema.  NEUROLOGICAL: Awake alert oriented, facial expression symmetrical, moving all  extremities.   Assessment & Plan Endometriosis   Severe endometriosis causes persistent and aggressive pain, especially during menstruation. Previous surgeries have not provided lasting relief. She experiences heavy menstrual bleeding with large clots and constant pain during periods, with some relief post-menstruation but persistent abdominal pain. A hysterectomy, sparing the ovaries, is planned to address the endometriosis. She is eager to proceed with surgery to alleviate symptoms before an upcoming trip. Coordinate with the gynecologist for the surgery date.  Appendectomy   An appendectomy is recommended during the upcoming surgery for endometriosis due to the appearance of the appendix and potential endometriosis involvement. The appendix will be removed as a precautionary measure, and the tissue will be sent for pathology to confirm the presence or absence of endometriosis. The procedure is expected to have minimal impact on recovery, using the same incision as the gynecologist.  Postoperative Care   Postoperative care  will align with the gynecologist's restrictions. The appendectomy is expected to have minimal impact on recovery, with discharge likely the same day. Recovery from the appendectomy alone would typically be complete within one to two weeks. Monitor for fever, worsening pain, or lightheadedness post-surgery.  Follow-up   No specific follow-up with general surgery is required post-appendectomy unless complications arise. Coordination with the gynecologist will determine the surgery date. She is open to scheduling as soon as possible to ensure recovery before her trip.   Endometriosis [N80.9]          Patient verbalized understanding, all questions were answered, and were agreeable with the plan outlined above.   Carolan Shiver, MD  Electronically signed by Carolan Shiver, MD

## 2023-08-25 NOTE — H&P (Signed)
 History of Present Illness [name] is a 28 year old female with endometriosis who presents for evaluation for appendectomy during surgery for endometriosis.  She has a history of severe endometriosis and has undergone two previous surgeries, the most recent being in September. Despite these interventions, her symptoms have not improved and have become more aggressive.  Her menstrual periods are irregular, starting with spotting for the first three days, followed by five days of heavy bleeding with large clots, causing her to nearly pass out due to the severity. She experiences constant pain during her periods, which improves slightly after her period ends, but she continues to have abdominal pain.  In addition to her endometriosis, she has had her fallopian tubes removed during a previous surgery. She is planning to have the surgery soon to allow for recovery before a trip to [place].      PAST MEDICAL HISTORY:  Past Medical History:  Diagnosis Date   Allergic rhinitis    Anxiety    Depression    Endometriosis of uterus    GERD (gastroesophageal reflux disease)    Migraines         PAST SURGICAL HISTORY:   Past Surgical History:  Procedure Laterality Date   CHOLECYSTECTOMY     CYSTOSCOPY     excision of endometriosis     lysis of adhesions     PELVIC LAPAROSCOPY     TONSILLECTOMY & ADENOIDECTOMY     TUBAL LIGATION     WISDOM TEETH           MEDICATIONS:  Outpatient Encounter Medications as of 08/25/2023  Medication Sig Dispense Refill   acetaminophen (TYLENOL) 325 MG tablet Take 650 mg by mouth every 4 (four) hours as needed for Pain (Patient not taking: Reported on 08/25/2023)     celecoxib (CELEBREX) 200 MG capsule  (Patient not taking: Reported on 08/06/2023)     doxylamine-pyridoxine, vit B6, (DICLEGIS) 10-10 mg DR tablet 2 Tablets ay night.  Add 1 tablet on day 3 in the morning if symptoms continue.  If still symptomatic on day 4 add 1 tablet in the afternoon. (Patient  not taking: Reported on 08/26/2022) 120 tablet 1   elagolix 150 mg Tab Take 1 tablet (150 mg total) by mouth once daily (Patient not taking: Reported on 08/06/2023) 28 tablet 11   ibuprofen (MOTRIN) 600 MG tablet Take 600 mg by mouth every 6 (six) hours (Patient not taking: Reported on 08/25/2023)     meloxicam (MOBIC) 15 MG tablet TAKE 1 TABLET BY MOUTH ONCE DAILY WITH MEALS (Patient not taking: Reported on 08/06/2023)     NIGHTTIME SLEEP-AID, DOXYLAMN, 25 mg tablet as directed (Patient not taking: Reported on 08/26/2022)     norethindrone (AYGESTIN) 5 mg tablet Take 1 tablet (5 mg total) by mouth once daily (Patient not taking: Reported on 08/06/2023) 30 tablet 11   oxyCODONE (ROXICODONE) 5 MG immediate release tablet Take 1 tablet (5 mg total) by mouth every 6 (six) hours as needed for Pain (Patient not taking: Reported on 08/06/2023) 15 tablet 0   PNV no.153/FA/om3/dha/epa/fish (PRENATAL GUMMIES ORAL) Take by mouth 2 (two) times daily (Patient not taking: Reported on 03/13/2022)     prenatal vit-CA-MIN-FE-FA (KPN ORAL) tablet Take 1 tablet by mouth once daily (Patient not taking: Reported on 08/25/2023) 30 tablet 6   VITAMIN B-6 25 MG tablet  (Patient not taking: Reported on 08/26/2022)     No facility-administered encounter medications on file as of 08/25/2023.  ALLERGIES:   Amoxicillin and Penicillin   SOCIAL HISTORY:  Social History   Socioeconomic History   Marital status: Married  Tobacco Use   Smoking status: Former   Smokeless tobacco: Never  Substance and Sexual Activity   Alcohol use: Not Currently   Drug use: No   Sexual activity: Yes    Partners: Male    Birth control/protection: Surgical   Social Drivers of Health   Financial Resource Strain: Low Risk  (08/06/2023)   Overall Financial Resource Strain (CARDIA)    Difficulty of Paying Living Expenses: Not hard at all  Food Insecurity: No Food Insecurity (08/06/2023)   Hunger Vital Sign    Worried About Running Out of Food  in the Last Year: Never true    Ran Out of Food in the Last Year: Never true  Transportation Needs: No Transportation Needs (08/06/2023)   PRAPARE - Administrator, Civil Service (Medical): No    Lack of Transportation (Non-Medical): No    FAMILY HISTORY:  Family History  Problem Relation Name Age of Onset   No Known Problems Mother     Diabetes type II Father     Heart disease Father       GENERAL REVIEW OF SYSTEMS:   General ROS: negative for - chills, fatigue, fever, weight gain or weight loss Allergy and Immunology ROS: negative for - hives  Hematological and Lymphatic ROS: negative for - bleeding problems or bruising, negative for palpable nodes Endocrine ROS: negative for - heat or cold intolerance, hair changes Respiratory ROS: negative for - cough, shortness of breath or wheezing Cardiovascular ROS: no chest pain or palpitations GI ROS: negative for nausea, vomiting, diarrhea, constipation.  Positive for abdominal pain Musculoskeletal ROS: negative for - joint swelling or muscle pain Neurological ROS: negative for - confusion, syncope Dermatological ROS: negative for pruritus and rash  PHYSICAL EXAM:  Vitals:   08/25/23 1401  BP: 127/83  Pulse: 76  .  Ht:152.4 cm (5') Wt:78.5 kg (173 lb) ZOX:WRUE surface area is 1.82 meters squared. Body mass index is 33.79 kg/m.Marland Kitchen   GENERAL: Alert, active, oriented x3  HEENT: Pupils equal reactive to light. Extraocular movements are intact. Sclera clear. Palpebral conjunctiva normal red color.Pharynx clear.  NECK: Supple with no palpable mass and no adenopathy.  LUNGS: Sound clear with no rales rhonchi or wheezes.  HEART: Regular rhythm S1 and S2 without murmur.  ABDOMEN: Soft and depressible, nontender with no palpable mass, no hepatomegaly.  EXTREMITIES: Well-developed well-nourished symmetrical with no dependent edema.  NEUROLOGICAL: Awake alert oriented, facial expression symmetrical, moving all  extremities.   Assessment & Plan Endometriosis   Severe endometriosis causes persistent and aggressive pain, especially during menstruation. Previous surgeries have not provided lasting relief. She experiences heavy menstrual bleeding with large clots and constant pain during periods, with some relief post-menstruation but persistent abdominal pain. A hysterectomy, sparing the ovaries, is planned to address the endometriosis. She is eager to proceed with surgery to alleviate symptoms before an upcoming trip. Coordinate with the gynecologist for the surgery date.  Appendectomy   An appendectomy is recommended during the upcoming surgery for endometriosis due to the appearance of the appendix and potential endometriosis involvement. The appendix will be removed as a precautionary measure, and the tissue will be sent for pathology to confirm the presence or absence of endometriosis. The procedure is expected to have minimal impact on recovery, using the same incision as the gynecologist.  Postoperative Care   Postoperative care  will align with the gynecologist's restrictions. The appendectomy is expected to have minimal impact on recovery, with discharge likely the same day. Recovery from the appendectomy alone would typically be complete within one to two weeks. Monitor for fever, worsening pain, or lightheadedness post-surgery.  Follow-up   No specific follow-up with general surgery is required post-appendectomy unless complications arise. Coordination with the gynecologist will determine the surgery date. She is open to scheduling as soon as possible to ensure recovery before her trip.   Endometriosis [N80.9]          Patient verbalized understanding, all questions were answered, and were agreeable with the plan outlined above.   Carolan Shiver, MD  Electronically signed by Carolan Shiver, MD

## 2023-09-08 ENCOUNTER — Ambulatory Visit: Admitting: Family Medicine

## 2023-09-08 NOTE — H&P (Signed)
 [name] is a 28 y.o. female presenting with Pre Op Consulting (Surgical consult - discuss hysterectomy)     History of Present Illness: 03/2023:  XI RA Dx Laparoscopy with excision of endometriosis, bilateral tubal ligation and lysis of adhesions, right extensive ureterolysis with right peritonectomy, left ureterolysis, posterior cul de sac lysis of adhesions and full peritonectomy with right  on 03/06/23. Indications for surgery: endometriosis   Her appendix was scarred retrocecally and I would recommend removal.   Stage III-IV.    Workup: Pap: 07/2023 neg/neg   Past Medical History:  has a past medical history of Allergic rhinitis, Anxiety, Depression, Endometriosis of uterus, GERD (gastroesophageal reflux disease), and Migraines.  Past Surgical History:  has a past surgical history that includes Cholecystectomy; WISDOM TEETH; Pelvic laparoscopy; Cystoscopy; lysis of adhesions; excision of endometriosis; Tonsillectomy & Adenoidectomy; and Tubal ligation. Family History: family history includes Diabetes type II in her father; Heart disease in her father; No Known Problems in her mother. Social History:  reports that she has quit smoking. She has never used smokeless tobacco. She reports that she does not currently use alcohol. She reports that she does not use drugs. OB/GYN History:  OB History       Gravida 4   Para 3   Term 3   Preterm     AB 1   Living 3       SAB 1   IAB     Ectopic     Molar     Multiple 0   Live Births 3        Allergies: is allergic to amoxicillin and penicillin. Medications: Current Medications  Current Outpatient Medications:    acetaminophen (TYLENOL) 325 MG tablet, Take 650 mg by mouth every 4 (four) hours as needed for Pain (Patient not taking: Reported on 08/14/2023), Disp: , Rfl:    celecoxib (CELEBREX) 200 MG capsule, , Disp: , Rfl:    doxylamine-pyridoxine, vit B6, (DICLEGIS) 10-10 mg DR tablet, 2 Tablets ay  night.  Add 1 tablet on day 3 in the morning if symptoms continue.  If still symptomatic on day 4 add 1 tablet in the afternoon. (Patient not taking: Reported on 08/14/2023), Disp: 120 tablet, Rfl: 1   elagolix 150 mg Tab, Take 1 tablet (150 mg total) by mouth once daily (Patient not taking: Reported on 08/14/2023), Disp: 28 tablet, Rfl: 11   ibuprofen (MOTRIN) 600 MG tablet, Take 600 mg by mouth every 6 (six) hours (Patient not taking: Reported on 08/14/2023), Disp: , Rfl:    meloxicam (MOBIC) 15 MG tablet, TAKE 1 TABLET BY MOUTH ONCE DAILY WITH MEALS (Patient not taking: Reported on 08/14/2023), Disp: , Rfl:    NIGHTTIME SLEEP-AID, DOXYLAMN, 25 mg tablet, as directed (Patient not taking: Reported on 08/14/2023), Disp: , Rfl:    norethindrone (AYGESTIN) 5 mg tablet, Take 1 tablet (5 mg total) by mouth once daily (Patient not taking: Reported on 08/14/2023), Disp: 30 tablet, Rfl: 11   oxyCODONE (ROXICODONE) 5 MG immediate release tablet, Take 1 tablet (5 mg total) by mouth every 6 (six) hours as needed for Pain (Patient not taking: Reported on 08/14/2023), Disp: 15 tablet, Rfl: 0   PNV no.153/FA/om3/dha/epa/fish (PRENATAL GUMMIES ORAL), Take by mouth 2 (two) times daily (Patient not taking: Reported on 08/14/2023), Disp: , Rfl:    prenatal vit-CA-MIN-FE-FA (KPN ORAL) tablet, Take 1 tablet by mouth once daily (Patient not taking: Reported on 03/24/2023), Disp: 30 tablet, Rfl: 6   VITAMIN B-6 25  MG tablet, , Disp: , Rfl:     Review of Systems: No SOB, no palpitations or chest pain, no new lower extremity edema, no nausea or vomiting or bowel or bladder complaints. See HPI for gyn specific ROS.    Exam:   BP 122/72   Pulse 101   Ht 152.4 cm (5')   Wt 78.5 kg (173 lb)   LMP 07/25/2023   Breastfeeding No   BMI 33.79 kg/m    General: Patient is well-groomed, well-nourished, appears stated age in no acute distress   HEENT: head is atraumatic and normocephalic, trachea is midline, neck is supple with no palpable  nodules   CV: Regular rhythm and normal heart rate, no murmur   Pulm: Clear to auscultation throughout lung fields with no wheezing, crackles, or rhonchi. No increased work of breathing   Abdomen: soft , no mass, non-tender, no rebound tenderness, no hepatomegaly   Pelvic: tanner stage 5 ,              External genitalia: vulva /labia no lesions             Urethra: no prolapse             Vagina: normal physiologic d/c, laxity in vaginal walls             Cervix: no lesions, no cervical motion tenderness, good descent             Uterus: normal size shape and contour, non-tender             Adnexa: no mass,  non-tender               Rectovaginal: External wnl   Impression:   The encounter diagnosis was Endometriosis.   Plan:   1.  -Patient returns for a preoperative discussion regarding her plans to proceed with surgical treatment of her endometriosis and AUB-E with pain by robotically assisted total laparoscopic hysterectomy with bilateral salpingectomy procedure.  We Joles perform a cystoscopy to evaluate the urinary tract after the procedure, if surgically indicated for uro tract integrity.    I will send for consult with gen surg re: appendectomy to fully remove endometriosis implants and scarring   The patient and I discussed the technical aspects of the procedure including the potential for risks and complications.  These include but are not limited to the risk of infection requiring post-operative antibiotics or further procedures.  We talked about the risk of injury to adjacent organs including bladder, bowel, ureter, blood vessels or nerves.  We talked about the need to convert to an open incision.  We talked about the possible need for blood transfusion.  We talked about postop complications such as thromboembolic or cardiopulmonary complications.  All of her questions were answered.  Her preoperative exam was completed and the appropriate consents were signed. She is scheduled  to undergo this procedure in the near future.       Diagnoses and all orders for this visit:   Endometriosis

## 2023-09-09 ENCOUNTER — Other Ambulatory Visit: Payer: Self-pay

## 2023-09-09 ENCOUNTER — Encounter
Admission: RE | Admit: 2023-09-09 | Discharge: 2023-09-09 | Disposition: A | Discharge status: Home / Self Care | Source: Ambulatory Visit | Attending: Obstetrics and Gynecology | Admitting: Obstetrics and Gynecology

## 2023-09-09 VITALS — Ht 61.0 in | Wt 173.0 lb

## 2023-09-09 DIAGNOSIS — Z01810 Encounter for preprocedural cardiovascular examination: Secondary | ICD-10-CM

## 2023-09-09 DIAGNOSIS — G90A Postural orthostatic tachycardia syndrome (POTS): Secondary | ICD-10-CM | POA: Diagnosis not present

## 2023-09-09 DIAGNOSIS — N809 Endometriosis, unspecified: Secondary | ICD-10-CM | POA: Insufficient documentation

## 2023-09-09 DIAGNOSIS — Z01818 Encounter for other preprocedural examination: Secondary | ICD-10-CM | POA: Insufficient documentation

## 2023-09-09 DIAGNOSIS — Z01812 Encounter for preprocedural laboratory examination: Secondary | ICD-10-CM

## 2023-09-09 HISTORY — DX: Vitamin D deficiency, unspecified: E55.9

## 2023-09-09 HISTORY — DX: Nausea with vomiting, unspecified: R11.2

## 2023-09-09 HISTORY — DX: Nausea with vomiting, unspecified: Z98.890

## 2023-09-09 HISTORY — DX: Endometriosis, unspecified: N80.9

## 2023-09-09 LAB — BASIC METABOLIC PANEL WITH GFR
Anion gap: 6 (ref 5–15)
BUN: 14 mg/dL (ref 6–20)
CO2: 23 mmol/L (ref 22–32)
Calcium: 8.6 mg/dL — ABNORMAL LOW (ref 8.9–10.3)
Chloride: 108 mmol/L (ref 98–111)
Creatinine, Ser: 0.71 mg/dL (ref 0.44–1.00)
GFR, Estimated: >60 mL/min (ref >60)
Glucose, Bld: 86 mg/dL (ref 70–99)
Potassium: 3.7 mmol/L (ref 3.5–5.1)
Sodium: 137 mmol/L (ref 135–145)

## 2023-09-09 LAB — TYPE AND SCREEN
ABO/RH(D): B NEG
Antibody Screen: NEGATIVE

## 2023-09-09 LAB — CBC
HCT: 41.5 % (ref 36.0–46.0)
Hemoglobin: 13.1 g/dL (ref 12.0–15.0)
MCH: 27.5 pg (ref 26.0–34.0)
MCHC: 31.6 g/dL (ref 30.0–36.0)
MCV: 87.2 fL (ref 80.0–100.0)
Platelets: 239 10*3/uL (ref 150–400)
RBC: 4.76 MIL/uL (ref 3.87–5.11)
RDW: 13.1 % (ref 11.5–15.5)
WBC: 7.9 10*3/uL (ref 4.0–10.5)
nRBC: 0 % (ref 0.0–0.2)

## 2023-09-09 NOTE — Patient Instructions (Addendum)
 Your procedure is scheduled on: Thursday, [place] 10 Report to the Registration Desk on the 1st floor of the [facility] To find out your arrival time, please call [phone] between 1PM - 3PM on: Wednesday, [place] 9 If your arrival time is 6:00 am, do not arrive before that time as the [facility] entrance doors do not open until 6:00 am.  REMEMBER: Instructions that are not followed completely Krinke result in serious medical risk, up to and including death; or upon the discretion of your surgeon and anesthesiologist your surgery Weekly need to be rescheduled.  Do not eat food after midnight the night before surgery.  No gum chewing or hard candies.  You Deleo however, drink CLEAR liquids up to 2 hours before you are scheduled to arrive for your surgery. Do not drink anything within 2 hours of your scheduled arrival time.  Clear liquids include: - water  - apple juice without pulp - gatorade (not RED colors) - black coffee or tea (Do NOT add milk or creamers to the coffee or tea) Do NOT drink anything that is not on this list.  One week prior to surgery: starting April 3 Stop Anti-inflammatories (NSAIDS) such as Advil, Aleve, Ibuprofen, Motrin, Naproxen, Naprosyn and Aspirin based products such as Excedrin, Goody's Powder, BC Powder. Stop ANY OVER THE COUNTER supplements until after surgery.  You Niblett however, continue to take Tylenol if needed for pain up until the day of surgery.  ON THE DAY OF SURGERY DO NOT TAKE ANY MEDICATIONS   No Alcohol for 24 hours before or after surgery.  No Smoking including e-cigarettes for 24 hours before surgery.  No chewable tobacco products for at least 6 hours before surgery.  No nicotine patches on the day of surgery.  Do not use any "recreational" drugs for at least a week (preferably 2 weeks) before your surgery.  Please be advised that the combination of cocaine and anesthesia Ziebell have negative outcomes, up to and including death. If you test  positive for cocaine, your surgery will be cancelled.  On the morning of surgery brush your teeth with toothpaste and water, you Hammer rinse your mouth with mouthwash if you wish. Do not swallow any toothpaste or mouthwash.  Use CHG Soap as directed on instruction sheet.  Do not wear jewelry, make-up, hairpins, clips or nail polish.  For welded (permanent) jewelry: bracelets, anklets, waist bands, etc.  Please have this removed prior to surgery.  If it is not removed, there is a chance that hospital personnel will need to cut it off on the day of surgery.  Do not wear lotions, powders, or perfumes.   Do not shave body hair from the neck down 48 hours before surgery.  Contact lenses, hearing aids and dentures Pinela not be worn into surgery.  Do not bring valuables to the hospital. [facility] is not responsible for any missing/lost belongings or valuables.   Notify your doctor if there is any change in your medical condition (cold, fever, infection).  Wear comfortable clothing (specific to your surgery type) to the hospital.  After surgery, you can help prevent lung complications by doing breathing exercises.  Take deep breaths and cough every 1-2 hours. Your doctor Mandler order a device called an Incentive Spirometer to help you take deep breaths. When coughing or sneezing, hold a pillow firmly against your incision with both hands. This is called "splinting." Doing this helps protect your incision. It also decreases belly discomfort.  If you are  being discharged the day of surgery, you will not be allowed to drive home. You will need a responsible individual to drive you home and stay with you for 24 hours after surgery.   If you are taking public transportation, you will need to have a responsible individual with you.  Please call the Pre-admissions Testing Dept. at [phone] if you have any questions about these instructions.  Surgery Visitation Policy:  Patients having surgery  or a procedure Scoles have two visitors.  Children under the age of 31 must have an adult with them who is not the patient.      Preparing for Surgery with CHLORHEXIDINE GLUCONATE (CHG) Soap  Chlorhexidine Gluconate (CHG) Soap  o An antiseptic cleaner that kills germs and bonds with the skin to continue killing germs even after washing  o Used for showering the night before surgery and morning of surgery  Before surgery, you can play an important role by reducing the number of germs on your skin.  CHG (Chlorhexidine gluconate) soap is an antiseptic cleanser which kills germs and bonds with the skin to continue killing germs even after washing.  Please do not use if you have an allergy to CHG or antibacterial soaps. If your skin becomes reddened/irritated stop using the CHG.  1. Shower the NIGHT BEFORE SURGERY and the MORNING OF SURGERY with CHG soap.  2. If you choose to wash your hair, wash your hair first as usual with your normal shampoo.  3. After shampooing, rinse your hair and body thoroughly to remove the shampoo.  4. Use CHG as you would any other liquid soap. You can apply CHG directly to the skin and wash gently with a scrungie or a clean washcloth.  5. Apply the CHG soap to your body only from the neck down. Do not use on open wounds or open sores. Avoid contact with your eyes, ears, mouth, and genitals (private parts). Wash face and genitals (private parts) with your normal soap.  6. Wash thoroughly, paying special attention to the area where your surgery will be performed.  7. Thoroughly rinse your body with warm water.  8. Do not shower/wash with your normal soap after using and rinsing off the CHG soap.  9. Pat yourself dry with a clean towel.  10. Wear clean pajamas to bed the night before surgery.  12. Place clean sheets on your bed the night of your first shower and do not sleep with pets.  13. Shower again with the CHG soap on the day of surgery prior to  arriving at the hospital.  14. Do not apply any deodorants/lotions/powders.  15. Please wear clean clothes to the hospital.

## 2023-09-14 ENCOUNTER — Ambulatory Visit (INDEPENDENT_AMBULATORY_CARE_PROVIDER_SITE_OTHER): Admitting: Family Medicine

## 2023-09-14 ENCOUNTER — Encounter: Payer: Self-pay | Admitting: Family Medicine

## 2023-09-14 VITALS — BP 131/92 | HR 75 | Temp 97.7°F | Resp 20 | Ht 61.0 in | Wt 173.0 lb

## 2023-09-14 DIAGNOSIS — R233 Spontaneous ecchymoses: Secondary | ICD-10-CM

## 2023-09-14 NOTE — Progress Notes (Signed)
 New Patient Office Visit  Subjective    Patient ID: [name], female    DOB: 1996/04/02  Age: 28 y.o. MRN: [id number]  CC:  Chief Complaint  Patient presents with   Establish Care   Sinusitis   Skin Problem    Hurts to be touched. Bruise easily (since December)    HPI [name] presents to establish care New patient to practice.  Delightful 28 yo with iron deficiency anemia, vitamin D deficiency, POTS, endometriosis, GERD, headaches, anxiety, s/p tonsillectomy and adenoidectomy, s/p cholecystitis, s/p diagnostic laparotomy, s/p BTL and wisdom teeth extraction.   She reports that she is bruising more easily.  She reports normal touch anywhere on her body is painful and she has a bruise there the following day.  She thinks this started in December.  She usually takes tylenol and IBU.  She is off IBU this week because she is scheduled for surgery Thursday, a partial hysterectomy for endometriosis.  She does not take any other medications or supplements.  She denies taking a corticosteroid or antidepressant.  She thinks maybe her mother bruises easily.    Outpatient Encounter Medications as of 09/14/2023  Medication Sig   acetaminophen (TYLENOL) 500 MG tablet Take 1,000 mg by mouth every 6 (six) hours as needed (pain.).   ibuprofen (ADVIL) 200 MG tablet Take 800 mg by mouth every 8 (eight) hours as needed (pain.).   No facility-administered encounter medications on file as of 09/14/2023.    Past Medical History:  Diagnosis Date   Anemia    Anxiety    Bladder infection, acute 01/10/2015   completed antibiotics and denies any s&s of infection (01-30-15)   Depression    Dysrhythmia    occ irregular heart beat per pt   Endometriosis    Gallstones 01/29/2015   GERD (gastroesophageal reflux disease)    NO MEDS   Headache    MIGRAINES DAILY   PONV (postoperative nausea and vomiting)    POTS (postural orthostatic tachycardia syndrome)    Vitamin D deficiency     Past  Surgical History:  Procedure Laterality Date   CHOLECYSTECTOMY N/A 02/01/2015   Procedure: LAPAROSCOPIC CHOLECYSTECTOMY WITH INTRAOPERATIVE CHOLANGIOGRAM;  Surgeon: Earline Mayotte, MD;  Location: [facility];  Service: General;  Laterality: N/A;   CYSTOSCOPY W/ RETROGRADES Left 04/19/2021   Procedure: CYSTOSCOPY WITH RETROGRADE PYELOGRAM;  Surgeon: Christeen Douglas, MD;  Location: [facility];  Service: Gynecology;  Laterality: Left;   TONSILLECTOMY AND ADENOIDECTOMY     TUBAL LIGATION  2024   WISDOM TOOTH EXTRACTION     XI ROBOT ASSISTED DIAGNOSTIC LAPAROSCOPY N/A 04/19/2021   Procedure: XI ROBOT ASSISTED DIAGNOSTIC LAPAROSCOPY, EXCISION OF ENDOMETRIOSIS, LYSIS OF ADHESIONS;  Surgeon: Christeen Douglas, MD;  Location: [facility];  Service: Gynecology;  Laterality: N/A;   XI ROBOT ASSISTED DIAGNOSTIC LAPAROSCOPY Bilateral 03/06/2023   Procedure: XI ROBOT ASSISTED DIAGNOSTIC LAPAROSCOPY, EXCISION OF ENDOMETRIOSIS, BILATERAL TUBAL LIGATION, LYSIS OF ADHESIONS;  Surgeon: Christeen Douglas, MD;  Location: [facility];  Service: Gynecology;  Laterality: Bilateral;    Family History  Problem Relation Age of Onset   Healthy Mother    Hypertension Father    Hyperlipidemia Father    Arrhythmia Father    Cancer Maternal Aunt        breast   CAD Paternal Grandmother        d. 35   CAD Paternal Grandfather        d. 39    Social History  Socioeconomic History   Marital status: Married    Spouse name: Thayer Ohm   Number of children: 3   Years of education: Not on file   Highest education level: Not on file  Occupational History   Not on file  Tobacco Use   Smoking status: Former    Current packs/day: 0.00    Average packs/day: 0.5 packs/day for 8.0 years (4.0 ttl pk-yrs)    Types: Cigarettes    Start date: 2011    Quit date: 2019    Years since quitting: 6.2    Passive exposure: Current   Smokeless tobacco: Never  Vaping Use   Vaping status: Never Used  Substance and Sexual Activity    Alcohol use: Not Currently    Alcohol/week: 0.0 standard drinks of alcohol    Comment: seldom   Drug use: No   Sexual activity: Yes    Partners: Male    Birth control/protection: Surgical  Other Topics Concern   Not on file  Social History Narrative   Not on file   Social Drivers of Health   Financial Resource Strain: Not on file  Food Insecurity: No Food Insecurity (07/21/2022)   Hunger Vital Sign    Worried About Running Out of Food in the Last Year: Never true    Ran Out of Food in the Last Year: Never true  Transportation Needs: No Transportation Needs (07/21/2022)   PRAPARE - Administrator, Civil Service (Medical): No    Lack of Transportation (Non-Medical): No  Physical Activity: Not on file  Stress: Not on file  Social Connections: Not on file  Intimate Partner Violence: Not At Risk (07/21/2022)   Humiliation, Afraid, Rape, and Kick questionnaire    Fear of Current or Ex-Partner: No    Emotionally Abused: No    Physically Abused: No    Sexually Abused: No    ROS      Objective   BP (!) 131/92 (BP Location: Left Arm, Patient Position: Sitting, Cuff Size: Normal)   Pulse 75   Temp 97.7 F (36.5 C) (Oral)   Resp 20   Ht 5\' 1"  (1.549 m)   Wt 173 lb (78.5 kg)   LMP 08/25/2023 (Exact Date)   SpO2 97%   BMI 32.69 kg/m    Physical Exam Vitals and nursing note reviewed.  Constitutional:      Appearance: Normal appearance.  HENT:     Head: Normocephalic and atraumatic.  Eyes:     Conjunctiva/sclera: Conjunctivae normal.  Cardiovascular:     Rate and Rhythm: Normal rate and regular rhythm.  Pulmonary:     Effort: Pulmonary effort is normal.     Breath sounds: Normal breath sounds.  Musculoskeletal:     Right lower leg: No edema.     Left lower leg: No edema.  Skin:    General: Skin is warm and dry.     Comments: One <1cm light purple bruise on right medial calf.    Neurological:     Mental Status: She is alert and oriented to person,  place, and time.  Psychiatric:        Mood and Affect: Mood normal.        Behavior: Behavior normal.        Thought Content: Thought content normal.        Judgment: Judgment normal.            The ASCVD Risk score (Arnett DK, et al., 2019) failed to calculate for  the following reasons:   The 2019 ASCVD risk score is only valid for ages 79 to 47     Assessment & Plan:  Easy bruising Assessment & Plan: No current bruising other than a < 1cm bruise on her medial calf right leg.  Just had CBC last week and platelet count was normal at 239.  Will check clotting factors with INR.  Unable to get platelet function test as an outpatient.    Orders: -     Protime-INR -     Platelet function assay    Return if symptoms worsen or fail to improve.   Alease Medina, MD

## 2023-09-14 NOTE — Assessment & Plan Note (Signed)
 No current bruising other than a < 1cm bruise on her medial calf right leg.  Just had CBC last week and platelet count was normal at 239.  Will check clotting factors with INR.  Unable to get platelet function test as an outpatient.

## 2023-09-15 ENCOUNTER — Encounter: Payer: Self-pay | Admitting: Family Medicine

## 2023-09-15 LAB — PROTIME-INR
INR: 1 (ref 0.9–1.2)
Prothrombin Time: 11.1 s (ref 9.1–12.0)

## 2023-09-16 ENCOUNTER — Ambulatory Visit: Payer: Self-pay | Admitting: General Surgery

## 2023-09-16 MED ORDER — ACETAMINOPHEN 500 MG PO TABS
1000.0000 mg | ORAL_TABLET | ORAL | Status: AC
  Administered 2023-09-17: 1000 mg via ORAL

## 2023-09-16 MED ORDER — CHLORHEXIDINE GLUCONATE 0.12 % MT SOLN
15.0000 mL | Freq: Once | OROMUCOSAL | Status: AC
  Administered 2023-09-17: 15 mL via OROMUCOSAL

## 2023-09-16 MED ORDER — ORAL CARE MOUTH RINSE: 15.0000 mL | Freq: Once | OROMUCOSAL | Status: AC

## 2023-09-16 MED ORDER — CEFAZOLIN SODIUM-DEXTROSE 2-4 GM/100ML-% IV SOLN: 2.0000 g | INTRAVENOUS | Status: DC

## 2023-09-16 MED ORDER — POVIDONE-IODINE 10 % EX SWAB
2.0000 | Freq: Once | CUTANEOUS | Status: AC
  Administered 2023-09-17: 2 via TOPICAL

## 2023-09-16 MED ORDER — LACTATED RINGERS IV SOLN: INTRAVENOUS | Status: DC

## 2023-09-17 ENCOUNTER — Ambulatory Visit: Payer: Self-pay | Admitting: Urgent Care

## 2023-09-17 ENCOUNTER — Encounter
Admission: RE | Disposition: A | Discharge status: Home / Self Care | Payer: Self-pay | Source: Home / Self Care | Attending: Obstetrics and Gynecology

## 2023-09-17 ENCOUNTER — Ambulatory Visit
Admission: RE | Admit: 2023-09-17 | Discharge: 2023-09-17 | Disposition: A | Discharge status: Home / Self Care | Attending: Obstetrics and Gynecology | Admitting: Obstetrics and Gynecology

## 2023-09-17 ENCOUNTER — Other Ambulatory Visit: Payer: Self-pay

## 2023-09-17 ENCOUNTER — Encounter: Payer: Self-pay | Admitting: Obstetrics and Gynecology

## 2023-09-17 ENCOUNTER — Ambulatory Visit

## 2023-09-17 DIAGNOSIS — Z9851 Tubal ligation status: Secondary | ICD-10-CM | POA: Diagnosis not present

## 2023-09-17 DIAGNOSIS — N809 Endometriosis, unspecified: Secondary | ICD-10-CM

## 2023-09-17 DIAGNOSIS — N920 Excessive and frequent menstruation with regular cycle: Secondary | ICD-10-CM | POA: Insufficient documentation

## 2023-09-17 DIAGNOSIS — Z87891 Personal history of nicotine dependence: Secondary | ICD-10-CM | POA: Diagnosis not present

## 2023-09-17 DIAGNOSIS — N736 Female pelvic peritoneal adhesions (postinfective): Secondary | ICD-10-CM | POA: Diagnosis not present

## 2023-09-17 DIAGNOSIS — Z01812 Encounter for preprocedural laboratory examination: Secondary | ICD-10-CM

## 2023-09-17 DIAGNOSIS — N8003 Adenomyosis of the uterus: Secondary | ICD-10-CM | POA: Diagnosis not present

## 2023-09-17 DIAGNOSIS — Z9079 Acquired absence of other genital organ(s): Secondary | ICD-10-CM | POA: Diagnosis not present

## 2023-09-17 HISTORY — PX: XI ROBOTIC LAPAROSCOPIC ASSISTED APPENDECTOMY: SHX6877

## 2023-09-17 HISTORY — PX: ROBOTIC ASSISTED LAPAROSCOPIC LYSIS OF ADHESION: SHX6080

## 2023-09-17 HISTORY — PX: CYSTOSCOPY: SHX5120

## 2023-09-17 LAB — POCT PREGNANCY, URINE: Preg Test, Ur: NEGATIVE

## 2023-09-17 SURGERY — HYSTERECTOMY, TOTAL, LAPAROSCOPIC, ROBOT-ASSISTED WITH SALPINGECTOMY
Anesthesia: General | Site: Bladder

## 2023-09-17 MED ORDER — CLINDAMYCIN PHOSPHATE 900 MG/50ML IV SOLN
INTRAVENOUS | Status: AC
Start: 2023-09-17 — End: ?
  Filled 2023-09-17: qty 50

## 2023-09-17 MED ORDER — DIPHENHYDRAMINE HCL 50 MG/ML IJ SOLN
INTRAMUSCULAR | Status: DC | PRN
  Administered 2023-09-17: 12.5 mg via INTRAVENOUS

## 2023-09-17 MED ORDER — LIDOCAINE HCL (PF) 2 % IJ SOLN
INTRAMUSCULAR | Status: AC
  Filled 2023-09-17: qty 5

## 2023-09-17 MED ORDER — PROPOFOL 10 MG/ML IV BOLUS
INTRAVENOUS | Status: AC
  Filled 2023-09-17: qty 20

## 2023-09-17 MED ORDER — ONDANSETRON 4 MG PO TBDP
4.0000 mg | ORAL_TABLET | Freq: Three times a day (TID) | ORAL | 0 refills | Status: AC | PRN
Start: 2023-09-17 — End: 2023-09-24

## 2023-09-17 MED ORDER — PROPOFOL 500 MG/50ML IV EMUL
INTRAVENOUS | Status: DC | PRN
  Administered 2023-09-17: 30 ug/kg/min via INTRAVENOUS

## 2023-09-17 MED ORDER — DEXAMETHASONE SODIUM PHOSPHATE 10 MG/ML IJ SOLN
INTRAMUSCULAR | Status: AC
  Filled 2023-09-17: qty 1

## 2023-09-17 MED ORDER — BUPIVACAINE-EPINEPHRINE (PF) 0.5% -1:200000 IJ SOLN
INTRAMUSCULAR | Status: AC
  Filled 2023-09-17: qty 30

## 2023-09-17 MED ORDER — FENTANYL CITRATE (PF) 100 MCG/2ML IJ SOLN
INTRAMUSCULAR | Status: AC
  Filled 2023-09-17: qty 2

## 2023-09-17 MED ORDER — PHENYLEPHRINE HCL-NACL 20-0.9 MG/250ML-% IV SOLN
INTRAVENOUS | Status: AC
  Filled 2023-09-17: qty 250

## 2023-09-17 MED ORDER — DROPERIDOL 2.5 MG/ML IJ SOLN: 0.6250 mg | Freq: Once | INTRAMUSCULAR | Status: DC | PRN

## 2023-09-17 MED ORDER — KETAMINE HCL 50 MG/5ML IJ SOSY
PREFILLED_SYRINGE | INTRAMUSCULAR | Status: AC
  Filled 2023-09-17: qty 5

## 2023-09-17 MED ORDER — OXYCODONE HCL 5 MG PO TABS: 5.0000 mg | ORAL_TABLET | ORAL | 0 refills | Status: DC | PRN

## 2023-09-17 MED ORDER — FENTANYL CITRATE (PF) 100 MCG/2ML IJ SOLN
INTRAMUSCULAR | Status: DC | PRN
Start: 2023-09-17 — End: 2023-09-17
  Administered 2023-09-17 (×2): 50 ug via INTRAVENOUS

## 2023-09-17 MED ORDER — 0.9 % SODIUM CHLORIDE (POUR BTL) OPTIME
TOPICAL | Status: DC | PRN
  Administered 2023-09-17: 100 mL

## 2023-09-17 MED ORDER — ONDANSETRON HCL 4 MG/2ML IJ SOLN
INTRAMUSCULAR | Status: AC
  Filled 2023-09-17: qty 2

## 2023-09-17 MED ORDER — FENTANYL CITRATE (PF) 100 MCG/2ML IJ SOLN
25.0000 ug | INTRAMUSCULAR | Status: DC | PRN
  Administered 2023-09-17 (×4): 25 ug via INTRAVENOUS

## 2023-09-17 MED ORDER — LIDOCAINE HCL (CARDIAC) PF 100 MG/5ML IV SOSY
PREFILLED_SYRINGE | INTRAVENOUS | Status: DC | PRN
  Administered 2023-09-17: 80 mg via INTRAVENOUS

## 2023-09-17 MED ORDER — ROCURONIUM BROMIDE 100 MG/10ML IV SOLN
INTRAVENOUS | Status: DC | PRN
  Administered 2023-09-17: 20 mg via INTRAVENOUS
  Administered 2023-09-17: 60 mg via INTRAVENOUS
  Administered 2023-09-17: 20 mg via INTRAVENOUS

## 2023-09-17 MED ORDER — ACETAMINOPHEN 500 MG PO TABS
ORAL_TABLET | ORAL | Status: AC
  Filled 2023-09-17: qty 2

## 2023-09-17 MED ORDER — PHENYLEPHRINE 80 MCG/ML (10ML) SYRINGE FOR IV PUSH (FOR BLOOD PRESSURE SUPPORT)
PREFILLED_SYRINGE | INTRAVENOUS | Status: AC
  Filled 2023-09-17: qty 10

## 2023-09-17 MED ORDER — ONDANSETRON HCL 4 MG/2ML IJ SOLN
INTRAMUSCULAR | Status: DC | PRN
  Administered 2023-09-17: 4 mg via INTRAVENOUS

## 2023-09-17 MED ORDER — DOCUSATE SODIUM 100 MG PO CAPS: 100.0000 mg | ORAL_CAPSULE | Freq: Two times a day (BID) | ORAL | 0 refills | Status: DC

## 2023-09-17 MED ORDER — CEFAZOLIN SODIUM-DEXTROSE 2-4 GM/100ML-% IV SOLN
INTRAVENOUS | Status: AC
  Filled 2023-09-17: qty 100

## 2023-09-17 MED ORDER — KETAMINE HCL 10 MG/ML IJ SOLN
INTRAMUSCULAR | Status: DC | PRN
  Administered 2023-09-17: 30 mg via INTRAVENOUS

## 2023-09-17 MED ORDER — PROPOFOL 1000 MG/100ML IV EMUL
INTRAVENOUS | Status: AC
  Filled 2023-09-17: qty 100

## 2023-09-17 MED ORDER — PHENYLEPHRINE 80 MCG/ML (10ML) SYRINGE FOR IV PUSH (FOR BLOOD PRESSURE SUPPORT)
PREFILLED_SYRINGE | INTRAVENOUS | Status: DC | PRN
  Administered 2023-09-17 (×3): 80 ug via INTRAVENOUS

## 2023-09-17 MED ORDER — KETOROLAC TROMETHAMINE 30 MG/ML IJ SOLN
INTRAMUSCULAR | Status: DC | PRN
  Administered 2023-09-17: 30 mg via INTRAVENOUS

## 2023-09-17 MED ORDER — BUPIVACAINE HCL (PF) 0.5 % IJ SOLN
INTRAMUSCULAR | Status: AC
  Filled 2023-09-17: qty 30

## 2023-09-17 MED ORDER — SUGAMMADEX SODIUM 200 MG/2ML IV SOLN
INTRAVENOUS | Status: DC | PRN
  Administered 2023-09-17: 200 mg via INTRAVENOUS

## 2023-09-17 MED ORDER — ROCURONIUM BROMIDE 10 MG/ML (PF) SYRINGE
PREFILLED_SYRINGE | INTRAVENOUS | Status: AC
  Filled 2023-09-17: qty 10

## 2023-09-17 MED ORDER — CLINDAMYCIN PHOSPHATE 900 MG/50ML IV SOLN
900.0000 mg | INTRAVENOUS | Status: AC
  Administered 2023-09-17: 900 mg via INTRAVENOUS

## 2023-09-17 MED ORDER — MIDAZOLAM HCL 2 MG/2ML IJ SOLN
INTRAMUSCULAR | Status: AC
  Filled 2023-09-17: qty 2

## 2023-09-17 MED ORDER — GABAPENTIN 300 MG PO CAPS: 300.0000 mg | ORAL_CAPSULE | Freq: Every day | ORAL | 0 refills | Status: DC

## 2023-09-17 MED ORDER — GLYCOPYRROLATE 0.2 MG/ML IJ SOLN
INTRAMUSCULAR | Status: AC
Start: 2023-09-17 — End: ?
  Filled 2023-09-17: qty 1

## 2023-09-17 MED ORDER — OXYCODONE HCL 5 MG PO TABS
ORAL_TABLET | ORAL | Status: AC
  Filled 2023-09-17: qty 1

## 2023-09-17 MED ORDER — BUPIVACAINE HCL (PF) 0.5 % IJ SOLN
INTRAMUSCULAR | Status: DC | PRN
  Administered 2023-09-17: 18 mL

## 2023-09-17 MED ORDER — CELECOXIB 200 MG PO CAPS: 200.0000 mg | ORAL_CAPSULE | Freq: Two times a day (BID) | ORAL | 0 refills | Status: AC

## 2023-09-17 MED ORDER — DIPHENHYDRAMINE HCL 50 MG/ML IJ SOLN
INTRAMUSCULAR | Status: AC
  Filled 2023-09-17: qty 1

## 2023-09-17 MED ORDER — ACETAMINOPHEN EXTRA STRENGTH 500 MG PO TABS: 1000.0000 mg | ORAL_TABLET | Freq: Four times a day (QID) | ORAL | 0 refills | Status: AC

## 2023-09-17 MED ORDER — DEXAMETHASONE SODIUM PHOSPHATE 10 MG/ML IJ SOLN
INTRAMUSCULAR | Status: DC | PRN
  Administered 2023-09-17: 10 mg via INTRAVENOUS

## 2023-09-17 MED ORDER — PHENYLEPHRINE HCL-NACL 20-0.9 MG/250ML-% IV SOLN
INTRAVENOUS | Status: DC | PRN
  Administered 2023-09-17: 15 ug/min via INTRAVENOUS

## 2023-09-17 MED ORDER — GLYCOPYRROLATE 0.2 MG/ML IJ SOLN
INTRAMUSCULAR | Status: DC | PRN
  Administered 2023-09-17: .1 mg via INTRAVENOUS

## 2023-09-17 MED ORDER — MIDAZOLAM HCL 2 MG/2ML IJ SOLN
INTRAMUSCULAR | Status: DC | PRN
  Administered 2023-09-17: 2 mg via INTRAVENOUS

## 2023-09-17 MED ORDER — OXYCODONE HCL 5 MG PO TABS
5.0000 mg | ORAL_TABLET | Freq: Once | ORAL | Status: AC
  Administered 2023-09-17: 5 mg via ORAL

## 2023-09-17 MED ORDER — SILVER NITRATE-POT NITRATE 75-25 % EX MISC
CUTANEOUS | Status: AC
  Filled 2023-09-17: qty 10

## 2023-09-17 MED ORDER — PROPOFOL 10 MG/ML IV BOLUS
INTRAVENOUS | Status: DC | PRN
  Administered 2023-09-17: 200 mg via INTRAVENOUS

## 2023-09-17 MED ORDER — HEMOSTATIC AGENTS (NO CHARGE) OPTIME
TOPICAL | Status: DC | PRN
  Administered 2023-09-17: 1 via TOPICAL

## 2023-09-17 MED ORDER — CHLORHEXIDINE GLUCONATE 0.12 % MT SOLN
OROMUCOSAL | Status: AC
  Filled 2023-09-17: qty 15

## 2023-09-17 MED ORDER — KETOROLAC TROMETHAMINE 30 MG/ML IJ SOLN
INTRAMUSCULAR | Status: AC
  Filled 2023-09-17: qty 1

## 2023-09-17 MED ORDER — GENTAMICIN SULFATE 40 MG/ML IJ SOLN
5.0000 mg/kg | INTRAMUSCULAR | Status: AC
  Administered 2023-09-17: 392.4 mg via INTRAVENOUS
  Filled 2023-09-17: qty 9.75

## 2023-09-17 SURGICAL SUPPLY — 86 items
APPLICATOR ARISTA FLEXITIP XL (MISCELLANEOUS) ×3 IMPLANT
BAG URINE DRAIN 2000ML AR STRL (UROLOGICAL SUPPLIES) ×3 IMPLANT
BINDER ABDOMINAL 9 SM 30-45 (SOFTGOODS) ×1 IMPLANT
BLADE SURG SZ11 CARB STEEL (BLADE) ×3 IMPLANT
CANNULA CAP OBTURATR AIRSEAL 8 (CAP) ×3 IMPLANT
CATH ROBINSON RED A/P 16FR (CATHETERS) ×2 IMPLANT
CATH URTH 16FR FL 2W BLN LF (CATHETERS) ×3 IMPLANT
CORD MONOPOLAR M/FML 12FT (MISCELLANEOUS) ×2 IMPLANT
COUNTER NDL MAGNETIC 40 RED (SET/KITS/TRAYS/PACK) IMPLANT
COUNTER NEEDLE MAGNETIC 40 RED (SET/KITS/TRAYS/PACK) IMPLANT
COVER TIP SHEARS 8 DVNC (MISCELLANEOUS) ×3 IMPLANT
DERMABOND ADVANCED .7 DNX12 (GAUZE/BANDAGES/DRESSINGS) ×3 IMPLANT
DRAPE ARM DVNC X/XI (DISPOSABLE) ×9 IMPLANT
DRAPE COLUMN DVNC XI (DISPOSABLE) ×3 IMPLANT
DRAPE SHEET LG 3/4 BI-LAMINATE (DRAPES) ×3 IMPLANT
DRAPE STERI POUCH LG 24X46 STR (DRAPES) IMPLANT
DRIVER NDL MEGA 8 DVNC XI (INSTRUMENTS) ×2 IMPLANT
DRIVER NDLE MEGA DVNC XI (INSTRUMENTS) ×3 IMPLANT
ELECT REM PT RETURN 9FT ADLT (ELECTROSURGICAL) ×3 IMPLANT
ELECTRODE REM PT RTRN 9FT ADLT (ELECTROSURGICAL) ×3 IMPLANT
FORCEPS BPLR FENES DVNC XI (FORCEP) ×3 IMPLANT
GAUZE 4X4 16PLY ~~LOC~~+RFID DBL (SPONGE) ×3 IMPLANT
GLOVE BIO SURGEON STRL SZ7 (GLOVE) ×13 IMPLANT
GLOVE BIOGEL PI IND STRL 7.5 (GLOVE) ×13 IMPLANT
GLOVE INDICATOR 7.5 STRL GRN (GLOVE) ×7 IMPLANT
GLOVE SURG SYN 8.0 (GLOVE) ×15 IMPLANT
GLOVE SURG SYN 8.0 PF PI (GLOVE) ×2 IMPLANT
GOWN STRL REUS W/ TWL LRG LVL3 (GOWN DISPOSABLE) ×11 IMPLANT
GOWN STRL REUS W/ TWL XL LVL3 (GOWN DISPOSABLE) ×7 IMPLANT
HEMOSTAT ARISTA ABSORB 3G PWDR (HEMOSTASIS) ×1 IMPLANT
IRRIGATION STRYKERFLOW (MISCELLANEOUS) ×1 IMPLANT
IRRIGATOR STRYKERFLOW (MISCELLANEOUS) ×3 IMPLANT
IV LACTATED RINGERS 1000ML (IV SOLUTION) ×2 IMPLANT
IV NS 1000ML BAXH (IV SOLUTION) ×3 IMPLANT
KIT PINK PAD W/HEAD ARE REST (MISCELLANEOUS) ×3 IMPLANT
KIT PINK PAD W/HEAD ARM REST (MISCELLANEOUS) ×3 IMPLANT
KIT TURNOVER CYSTO (KITS) ×3 IMPLANT
L-HOOK LAP DISP 36CM (ELECTROSURGICAL) IMPLANT
LABEL OR SOLS (LABEL) ×3 IMPLANT
LHOOK LAP DISP 36CM (ELECTROSURGICAL) IMPLANT
LIGASURE VESSEL 5MM BLUNT TIP (ELECTROSURGICAL) IMPLANT
MANIFOLD NEPTUNE II (INSTRUMENTS) ×3 IMPLANT
MANIPULATOR UTERINE 4.5 ZUMI (MISCELLANEOUS) IMPLANT
MANIPULATOR VCARE LG CRV RETR (MISCELLANEOUS) ×1 IMPLANT
MANIPULATOR VCARE SML CRV RETR (MISCELLANEOUS) IMPLANT
MANIPULATOR VCARE STD CRV RETR (MISCELLANEOUS) IMPLANT
NS IRRIG 500ML POUR BTL (IV SOLUTION) ×3 IMPLANT
OBTURATOR OPTICAL STND 8 DVNC (TROCAR) ×3 IMPLANT
OBTURATOR OPTICALSTD 8 DVNC (TROCAR) ×3 IMPLANT
OCCLUDER COLPOPNEUMO (BALLOONS) ×3 IMPLANT
PACK GYN LAPAROSCOPIC (MISCELLANEOUS) ×3 IMPLANT
PAD OB MATERNITY 11 LF (PERSONAL CARE ITEMS) ×3 IMPLANT
PAD PREP OB/GYN DISP 24X41 (PERSONAL CARE ITEMS) ×3 IMPLANT
SCISSORS METZENBAUM CVD 33 (INSTRUMENTS) IMPLANT
SCISSORS MNPLR CVD DVNC XI (INSTRUMENTS) ×3 IMPLANT
SCRUB CHG 4% DYNA-HEX 4OZ (MISCELLANEOUS) ×3 IMPLANT
SEAL UNIV 5-12 XI (MISCELLANEOUS) ×7 IMPLANT
SEALER VESSEL EXT DVNC XI (MISCELLANEOUS) ×1 IMPLANT
SET CYSTO W/LG BORE CLAMP LF (SET/KITS/TRAYS/PACK) ×3 IMPLANT
SET TUBE FILTERED XL AIRSEAL (SET/KITS/TRAYS/PACK) ×3 IMPLANT
SET TUBE SMOKE EVAC HIGH FLOW (TUBING) ×2 IMPLANT
SLEEVE Z-THREAD 5X100MM (TROCAR) ×2 IMPLANT
SOL ELECTROSURG ANTI STICK (MISCELLANEOUS) ×3 IMPLANT
SOL PREP PVP 2OZ (MISCELLANEOUS) ×3 IMPLANT
SOLUTION ELECTROSURG ANTI STCK (MISCELLANEOUS) ×3 IMPLANT
SOLUTION PREP PVP 2OZ (MISCELLANEOUS) ×3 IMPLANT
STRIP CLOSURE SKIN 1/4X4 (GAUZE/BANDAGES/DRESSINGS) IMPLANT
SURGILUBE 2OZ TUBE FLIPTOP (MISCELLANEOUS) ×3 IMPLANT
SUT MNCRL 4-0 27 PS-2 XMFL (SUTURE) ×3 IMPLANT
SUT MNCRL AB 4-0 PS2 18 (SUTURE) ×2 IMPLANT
SUT STRATA PDS 0 30 CT-2.5 (SUTURE) ×2 IMPLANT
SUT STRATA PDS 2-0 23 CT-2.5 (SUTURE) ×3 IMPLANT
SUT STRATAFIX PDS 30 CT-1 (SUTURE) ×1 IMPLANT
SUT STRATAFIX SPIRAL PDS+ 0 30 (SUTURE) ×1 IMPLANT
SUTURE MNCRL 4-0 27XMF (SUTURE) ×3 IMPLANT
SUTURE STRAT PDS 2-0 23 CT-2.5 (SUTURE) ×1 IMPLANT
SYR 50ML LL SCALE MARK (SYRINGE) ×4 IMPLANT
SYR 5ML LL (SYRINGE) IMPLANT
SYS BAG RETRIEVAL 10MM (BASKET) IMPLANT
SYS RETRIEVAL 5MM INZII UNIV (BASKET) ×3 IMPLANT
SYSTEM BAG RETRIEVAL 10MM (BASKET) IMPLANT
SYSTEM RETRIEVL 5MM INZII UNIV (BASKET) ×1 IMPLANT
TRAP FLUID SMOKE EVACUATOR (MISCELLANEOUS) ×2 IMPLANT
TROCAR Z-THREAD FIOS 5X100MM (TROCAR) ×2 IMPLANT
TUBING ART PRESS 48 MALE/FEM (TUBING) IMPLANT
WATER STERILE IRR 500ML POUR (IV SOLUTION) ×2 IMPLANT

## 2023-09-17 NOTE — Transfer of Care (Signed)
 Immediate Anesthesia Transfer of Care Note  Patient: [name]  Procedure(s) Performed: HYSTERECTOMY, TOTAL, LAPAROSCOPIC, ROBOT-ASSISTED (Abdomen) LYSIS, ADHESIONS, ROBOT-ASSISTED, LAPAROSCOPIC (Abdomen) CYSTOSCOPY (Bladder) APPENDECTOMY, ROBOT-ASSISTED, LAPAROSCOPIC (Abdomen) LYSIS, ADHESIONS, ROBOT-ASSISTED, LAPAROSCOPIC (Abdomen)  Patient Location: PACU  Anesthesia Type:General  Level of Consciousness: awake and drowsy  Airway & Oxygen Therapy: Patient connected to face mask oxygen  Post-op Assessment: Report given to RN and Post -op Vital signs reviewed and stable  Post vital signs: Reviewed and stable  Last Vitals:  Vitals Value Taken Time  BP 99/69 09/17/23 1024  Temp    Pulse 89 09/17/23 1028  Resp 20 09/17/23 1028  SpO2 100 % 09/17/23 1028  Vitals shown include unfiled device data.  Last Pain:  Vitals:   09/17/23 0646  TempSrc: Temporal  PainSc: 0-No pain     Pt transported to PACU with circ RN and report given to PACU RN. Patent airway to PACU, pt breathing spontaneously on 6L O2 via FM. Pt awake but sleepy, appears comfortable. VSS.    Complications: No notable events documented.

## 2023-09-17 NOTE — Interval H&P Note (Signed)
 History and Physical Interval Note:  09/17/2023 7:21 AM  [name]  has presented today for surgery, with the diagnosis of Endometriosis.  The various methods of treatment have been discussed with the patient and family. After consideration of risks, benefits and other options for treatment, the patient has consented to  Procedure(s): HYSTERECTOMY, TOTAL, LAPAROSCOPIC, ROBOT-ASSISTED WITH SALPINGECTOMY (N/A) LYSIS, ADHESIONS, LAPAROSCOPIC (N/A) CYSTOSCOPY (N/A) APPENDECTOMY, ROBOT-ASSISTED, LAPAROSCOPIC (N/A) as a surgical intervention.  The patient's history has been reviewed, patient examined, no change in status, stable for surgery.  I have reviewed the patient's chart and labs.  Questions were answered to the patient's satisfaction.     Christeen Douglas

## 2023-09-17 NOTE — Interval H&P Note (Signed)
 History and Physical Interval Note:  09/17/2023 6:54 AM  [name]  has presented today for surgery, with the diagnosis of Endometriosis.  The various methods of treatment have been discussed with the patient and family. After consideration of risks, benefits and other options for treatment, the patient has consented to  Procedure(s): HYSTERECTOMY, TOTAL, LAPAROSCOPIC, ROBOT-ASSISTED WITH SALPINGECTOMY (N/A) LYSIS, ADHESIONS, LAPAROSCOPIC (N/A) CYSTOSCOPY (N/A) APPENDECTOMY, ROBOT-ASSISTED, LAPAROSCOPIC (N/A) as a surgical intervention.  The patient's history has been reviewed, patient examined, no change in status, stable for surgery.  I have reviewed the patient's chart and labs.  Questions were answered to the patient's satisfaction.     Carolan Shiver

## 2023-09-17 NOTE — Op Note (Signed)
 Pre-op Diagnosis: Endometriosis, right lower quadrant pain   Post op Diagnosis: Endometriosis, right lower quadrant pain, Adhesions  Procedure: Robotic assisted laparoscopic appendectomy.                     Robotic assisted laparoscopic lysis of adhesions  Anesthesia: GETA  Surgeon: Carolan Shiver, MD, FACS  Wound Classification: clean contaminated  Specimen: Appendix  Complications: None  Estimated Blood Loss: 3 mL   Indications: Patient is a 28 y.o. female  presented for surgical resection of endometriosis.  Due to right upper quadrant pain and severe endometriosis appendectomy was recommended by gynecology to rule out bleeding endometriosis of the appendix.  FIndings: 1.  Normal-looking appendix 2.  Abundant amount of adhesions between sigmoid and rectum to the uterus  Description of procedure: The patient was placed on the operating table in the supine position. General anesthesia was induced. A time-out was completed verifying correct patient, procedure, site, positioning, and implant(s) and/or special equipment prior to beginning this procedure. The abdomen was prepped and draped in the usual sterile fashion.   Veress needle was inserted inserted in the supraumbilical area.  After confirming 2 clicks and a positive saline drop test, gas insufflation was initiated until the abdominal pressure was measured at 15 mmHg.  Afterwards, the Veress needle was removed and a 8 mm port was placed in supraumbilical, midline area using Optiview technique.  After local was infused, 2 additional incision on the right and left abdominal wall lateral to the rectus muscle were made 5 cm apart and two 8 mm ports were placed under direct visualization.  No injuries from trocar placements were noted.  The table was placed in the Trendelenburg position.  With the use Vistaseal or and fenestrated bipolar window created at base of appendix in the mesentery.    The mesoappendix was divided with  combination of bipolar energy with the vessel sealer.  The base of the appendix was ligated with 2-0 STRATAFIX the appendix was divided with monopolar vessel sealer.  A second layer of the 2-0 Stratafix was done over the appendiceal stump.  The appendiceal stump was examined and hemostasis noted.  Due to a wound amount of adhesion from rectum to the uterus, gynecology requested assistant with lysis of adhesion.  Hide very difficult and time-consuming likely that lesion was not between the sigmoid colon and rectum to the uterus.  The was able to freely the uterus.  Once the uterus was freed from adhesions, gynecology proceeded with hysterectomy and excision of endometriosis.  No acute complication during my procedure.

## 2023-09-17 NOTE — Discharge Instructions (Addendum)
 Discharge instructions after  robotically-assisted total laparoscopic hysterectomy   For the next three days, take ibuprofen and acetaminophen on a schedule, every 8 hours. You can take them together or you can intersperse them, and take one every four hours. I also gave you gabapentin for nighttime, to help you sleep and also to control pain. Take gabapentin medicines at night for at least the next 3 nights. You also have a narcotic, oxycodone, to take as needed if the above medicines don't help.  Postop constipation is a major cause of pain. Stay well hydrated, walk as you tolerate, and take over the counter senna as well as stool softeners if you need them.   Signs and Symptoms to Report Call our office at [phone] if you have any of the following.   Fever over 100.4 degrees or higher  Severe stomach pain not relieved with pain medications  Bright red bleeding that's heavier than a period that does not slow with rest  To go the bathroom a lot (frequency), you can't hold your urine (urgency), or it hurts when you empty your bladder (urinate)  Chest pain  Shortness of breath  Pain in the calves of your legs  Severe nausea and vomiting not relieved with anti-nausea medications  Signs of infection around your wounds, such as redness, hot to touch, swelling, green/yellow drainage (like pus), bad smelling discharge  Any concerns  What You Can Expect after Surgery  You Hunkins see some pink tinged, bloody fluid and bruising around the wound. This is normal.  You Buchholz notice shoulder and neck pain. This is caused by the gas used during surgery to expand your abdomen so your surgeon could get to the uterus easier.  You Branscum have a sore throat because of the tube in your mouth during general anesthesia. This will go away in 2 to 3 days.  You Gallier have some stomach cramps.  You Chiles notice spotting on your panties.  You Cauthon have pain around the incision sites.   Activities after Your  Discharge Follow these guidelines to help speed your recovery at home:  Do the coughing and deep breathing as you did in the hospital for 2 weeks. Use the small blue breathing device, called the incentive spirometer for 2 weeks.  Don't drive if you are in pain or taking narcotic pain medicine. You Doshi drive when you can safely slam on the brakes, turn the wheel forcefully, and rotate your torso comfortably. This is typically 1-2 weeks. Practice in a parking lot or side street prior to attempting to drive regularly.   Ask others to help with household chores for 4 weeks.  Do not lift anything heavier that 10 pounds for 4-6 weeks. This includes pets, children, and groceries.  Don't do strenuous activities, exercises, or sports like vacuuming, tennis, squash, etc. until your doctor says it is safe to do so. ---Maintain pelvic rest for 12 weeks. This means nothing in the vagina or rectum at all (no douching, tampons, intercourse) for 12 weeks.   Walk as you feel able. Rest often since it Dineen take two or three weeks for your energy level to return to normal.   You Borelli climb stairs  Avoid constipation:   -Eat fruits, vegetables, and whole grains. Eat small meals as your appetite will take time to return to normal.   -Drink 6 to 8 glasses of water each day unless your doctor has told you to limit your fluids.   -Use a laxative or  stool softener as needed if constipation becomes a problem. You Schanz take Miralax, metamucil, Citrucil, Colace, Senekot, FiberCon, etc. If this does not relieve the constipation, try two tablespoons of Milk Of Magnesia every 8 hours until your bowels move.   You Ohern shower. Gently wash the wounds with a mild soap and water. Pat dry.  Do not get in a hot tub, swimming pool, etc. for 6 weeks.  Do not use lotions, oils, powders on the wounds.  Do not douche, use tampons, or have sex until your doctor says it is okay.  Take your pain medicine when you need it. The medicine Quinney not  work as well if the pain is bad.  Take the medicines you were taking before surgery. Other medications you will need are pain medications (Norco or Percocet) and nausea medications (Zofran).  Discharge instructions after  robotically-assisted total laparoscopic hysterectomy   For the next three days, take ibuprofen and acetaminophen on a schedule, every 8 hours. You can take them together or you can intersperse them, and take one every four hours. I also gave you gabapentin for nighttime, to help you sleep and also to control pain. Take gabapentin medicines at night for at least the next 3 nights. You also have a narcotic, oxycodone, to take as needed if the above medicines don't help.  Postop constipation is a major cause of pain. Stay well hydrated, walk as you tolerate, and take over the counter senna as well as stool softeners if you need them.   Signs and Symptoms to Report Call our office at [phone] if you have any of the following.   Fever over 100.4 degrees or higher  Severe stomach pain not relieved with pain medications  Bright red bleeding that's heavier than a period that does not slow with rest  To go the bathroom a lot (frequency), you can't hold your urine (urgency), or it hurts when you empty your bladder (urinate)  Chest pain  Shortness of breath  Pain in the calves of your legs  Severe nausea and vomiting not relieved with anti-nausea medications  Signs of infection around your wounds, such as redness, hot to touch, swelling, green/yellow drainage (like pus), bad smelling discharge  Any concerns  What You Can Expect after Surgery  You Foisy see some pink tinged, bloody fluid and bruising around the wound. This is normal.  You Cocking notice shoulder and neck pain. This is caused by the gas used during surgery to expand your abdomen so your surgeon could get to the uterus easier.  You Cariker have a sore throat because of the tube in your mouth during general anesthesia.  This will go away in 2 to 3 days.  You Leckrone have some stomach cramps.  You Bilodeau notice spotting on your panties.  You Fiorello have pain around the incision sites.   Activities after Your Discharge Follow these guidelines to help speed your recovery at home:  Do the coughing and deep breathing as you did in the hospital for 2 weeks. Use the small blue breathing device, called the incentive spirometer for 2 weeks.  Don't drive if you are in pain or taking narcotic pain medicine. You Mennenga drive when you can safely slam on the brakes, turn the wheel forcefully, and rotate your torso comfortably. This is typically 1-2 weeks. Practice in a parking lot or side street prior to attempting to drive regularly.   Ask others to help with household chores for 4 weeks.  Do  not lift anything heavier that 10 pounds for 4-6 weeks. This includes pets, children, and groceries.  Don't do strenuous activities, exercises, or sports like vacuuming, tennis, squash, etc. until your doctor says it is safe to do so. ---Maintain pelvic rest for 12 weeks. This means nothing in the vagina or rectum at all (no douching, tampons, intercourse) for 12 weeks.   Walk as you feel able. Rest often since it Heskett take two or three weeks for your energy level to return to normal.   You Kanno climb stairs  Avoid constipation:   -Eat fruits, vegetables, and whole grains. Eat small meals as your appetite will take time to return to normal.   -Drink 6 to 8 glasses of water each day unless your doctor has told you to limit your fluids.   -Use a laxative or stool softener as needed if constipation becomes a problem. You Else take Miralax, metamucil, Citrucil, Colace, Senekot, FiberCon, etc. If this does not relieve the constipation, try two tablespoons of Milk Of Magnesia every 8 hours until your bowels move.   You Salido shower. Gently wash the wounds with a mild soap and water. Pat dry.  Do not get in a hot tub, swimming pool, etc. for 6 weeks.  Do not  use lotions, oils, powders on the wounds.  Do not douche, use tampons, or have sex until your doctor says it is okay.  Take your pain medicine when you need it. The medicine Lesh not work as well if the pain is bad.  Take the medicines you were taking before surgery. Other medications you will need are pain medications (Norco or Percocet) and nausea medications (Zofran).  Discharge instructions after  robotically-assisted total laparoscopic hysterectomy   For the next three days, take ibuprofen and acetaminophen on a schedule, every 8 hours. You can take them together or you can intersperse them, and take one every four hours. I also gave you gabapentin for nighttime, to help you sleep and also to control pain. Take gabapentin medicines at night for at least the next 3 nights. You also have a narcotic, oxycodone, to take as needed if the above medicines don't help.  Postop constipation is a major cause of pain. Stay well hydrated, walk as you tolerate, and take over the counter senna as well as stool softeners if you need them.   Signs and Symptoms to Report Call our office at [phone] if you have any of the following.   Fever over 100.4 degrees or higher  Severe stomach pain not relieved with pain medications  Bright red bleeding that's heavier than a period that does not slow with rest  To go the bathroom a lot (frequency), you can't hold your urine (urgency), or it hurts when you empty your bladder (urinate)  Chest pain  Shortness of breath  Pain in the calves of your legs  Severe nausea and vomiting not relieved with anti-nausea medications  Signs of infection around your wounds, such as redness, hot to touch, swelling, green/yellow drainage (like pus), bad smelling discharge  Any concerns  What You Can Expect after Surgery  You Szalkowski see some pink tinged, bloody fluid and bruising around the wound. This is normal.  You Rouser notice shoulder and neck pain. This is caused by the gas  used during surgery to expand your abdomen so your surgeon could get to the uterus easier.  You Domine have a sore throat because of the tube in your mouth during general anesthesia.  This will go away in 2 to 3 days.  You Peed have some stomach cramps.  You Mills notice spotting on your panties.  You Alexie have pain around the incision sites.   Activities after Your Discharge Follow these guidelines to help speed your recovery at home:  Do the coughing and deep breathing as you did in the hospital for 2 weeks. Use the small blue breathing device, called the incentive spirometer for 2 weeks.  Don't drive if you are in pain or taking narcotic pain medicine. You Caven drive when you can safely slam on the brakes, turn the wheel forcefully, and rotate your torso comfortably. This is typically 1-2 weeks. Practice in a parking lot or side street prior to attempting to drive regularly.   Ask others to help with household chores for 4 weeks.  Do not lift anything heavier that 10 pounds for 4-6 weeks. This includes pets, children, and groceries.  Don't do strenuous activities, exercises, or sports like vacuuming, tennis, squash, etc. until your doctor says it is safe to do so. ---Maintain pelvic rest for 12 weeks. This means nothing in the vagina or rectum at all (no douching, tampons, intercourse) for 12 weeks.   Walk as you feel able. Rest often since it Brocato take two or three weeks for your energy level to return to normal.   You Guerrier climb stairs  Avoid constipation:   -Eat fruits, vegetables, and whole grains. Eat small meals as your appetite will take time to return to normal.   -Drink 6 to 8 glasses of water each day unless your doctor has told you to limit your fluids.   -Use a laxative or stool softener as needed if constipation becomes a problem. You Brackney take Miralax, metamucil, Citrucil, Colace, Senekot, FiberCon, etc. If this does not relieve the constipation, try two tablespoons of Milk Of Magnesia every  8 hours until your bowels move.   You Brisbane shower. Gently wash the wounds with a mild soap and water. Pat dry.  Do not get in a hot tub, swimming pool, etc. for 6 weeks.  Do not use lotions, oils, powders on the wounds.  Do not douche, use tampons, or have sex until your doctor says it is okay.  Take your pain medicine when you need it. The medicine Mcclafferty not work as well if the pain is bad.  Take the medicines you were taking before surgery. Other medications you will need are pain medications (Norco or Percocet) and nausea medications (Zofran).  Here is a helpful article from the website http://mitchell.org/, regarding constipation  Here are reasons why constipation occurs after surgery: 1) During the operation and in the recovery room, most people are given opioid pain medication, primarily through an IV, to treat moderate or severe pain. Intravenous opioids include morphine, Dilaudid and fentanyl. After surgery, patients are often prescribed opioid pain medication to take by mouth at home, including codeine, Vicodin, Norco, and Percocet. All of these medications cause constipation by slowing down the movement of your intestine. 2) Changes in your diet before surgery can be another culprit. It is common to get specific instructions to change how you normally eat or drink before your surgery, like only having liquids the day before or not having anything to eat or drink after midnight the night before surgery. For this reason, temporary dehydration Antonelli occur. This, along with not eating or only having liquids, means that you are getting less fiber than usual. Both these factors contribute to  constipation. 3) Changes in your diet after surgery can also contribute to the problem. Although many people don't have dietary restrictions after operations, being under anesthesia can make you lose your appetite for several hours and maybe even days. Some people can even have nausea or vomiting. Not eating or drinking  normally means that you are not getting enough fiber and you can get dehydrated, both leading to constipation. 4) Lying in a bed more than usual--which happens before, during and after surgery--combined with the medications and diet changes, all work together to slow down your colon and make your poop turn to rock.  No one likes to be constipated.  Let's face it, it's not a pleasant feeling when you don't poop for days, then strain on the toilet to finally pass something large enough to cause damage. An ounce of prevention is worth a pound of cure, so: Assume you will be constipated. Plan and prepare accordingly. Post-surgery is one of those unique situations where the temporary use of laxatives can make a world of difference. Always consult with your doctor, and recognize that if you wait several days after surgery to take a laxative, the constipation might be too severe for these over-the-counter options. It is always important to discuss all medications you plan on taking with your doctor. Ask your doctor if you can start the laxative immediately after surgery. *  Here are go-to post-surgery laxatives: Senna: Senna is an herb that acts as a "stimulant laxative," meaning it increases the activity of the intestine to cause you to have a bowel movement. It comes in many forms, but senna pills are easy to take and are sold over the counter at almost all pharmacies. Since opioid pain medications slow down the activity of the intestine, it makes sense to take a medication to help reverse that side effect. Long-term use of a stimulant laxative is not a good idea since it can make your colon "lazy" and not function properly; however, temporary use immediately after surgery is acceptable. In general, if you are able to eat a normal diet, taking senna soon after surgery works the best. Senna usually works within hours to produce a bowel movement, but this is less predictable when you are taking different  medications after surgery. Try not to wait several days to start taking senna, as often it is too late by then. Just like with all medications or supplements, check with your doctor before starting new treatment.   Magnesium: Magnesium is an important mineral that our body needs. We get magnesium from some foods that we eat, especially foods that are high in fiber such as broccoli, almonds and whole grains. There are also magnesium-based medications used to treat constipation including milk of magnesia (magnesium hydroxide), magnesium citrate and magnesium oxide. They work by drawing water into the intestine, putting it into the class of "osmotic" laxatives. Magnesium products in low doses appear to be safe, but if taken in very large doses, can lead to problems such as irregular heartbeat, low blood pressure and even death. It can also affect other medications you might be taking, therefore it is important to discuss using magnesium with your physician and pharmacist before initiating therapy. Most over-the-counter magnesium laxatives work very well to help with the constipation related to surgery, but sometimes they work too well and lead to diarrhea. Make sure you are somewhere with easy access to a bathroom, just in case.   Bisacodyl: Bisacodyl (generic name) is sold under brand names such  as Dulcolax. Much like senna, it is a "stimulant laxative," meaning it makes your intestines move more quickly to push out the stool. This is another good choice to start taking as soon as your doctor says you can take a laxative after surgery. It comes in pill form and as a suppository, which is a good choice for people who cannot or are not allowed to swallow pills. Studies have shown that it works as a laxative, but like most of these medications, you should use this on a short-term basis only.   Enema: Enemas strike fear in many people, but FEAR NOT! It's nowhere near as big a deal as you Kittelson think. An enema is just  a way to get some liquid into your rectum by placing a specially designed device through your anus. If you have never done one, it might seem like a painful, unpleasant, uncomfortable, complicated and lengthy procedure. But in reality, it's simple, takes just a few seconds and is highly effective. The small ready-made bottles you buy at the pharmacy are much easier than the hose/large rubber container type. Those recommended positions illustrated in some instructions are generally not necessary to place the enema. It's very similar to the insertion of a tampon, requiring a slight squat. Some extra lubrication on the enema's tip (or on your anus) will make it a breeze. In certain cases, there is no substitute for a good enema. For example, if someone has not pooped for a few days, the beginning of the poop waiting to come out can become rock hard. Passing that hard stool can lead to much pain and problems like anal fissures. Inserting a little liquid to break up the rock-hard stool will help make its passage much easier. Enemas come with different liquids. Most come with saline, but there are also mineral oil options. You can also use warm water in the reusable enema containers. They all work. But since saline can sometimes be irritating, so try a mineral oil or water enema instead.  Here are commonly recommended constipation medications that do not work well for post-surgery constipation: Docusate: Docusate (generic name) most commonly referred to as Colace (brand name) is not really a laxative, but is classified as a stool softener. Although this medication is commonly prescribed, it is not recommended for several reasons: 1) there is no good medical evidence that it works 2) even if it has an effect, which is very questionable, it is minimal and cannot combat the intestinal slowing caused by the opioid medications. Skip docusate to save money and space in your pillbox for something more effective.  PEG:  Miralax (brand name) is basically a chemical called polyethylene glycol (PEG) and it has gained tremendous popularity as a laxative. This product is an "osmotic laxative" meaning it works by pulling water into the stool, making it softer. This is very similar to the action of natural fiber in foods and supplements. Therefore, the effect seen by this medication is not immediate, causing a bowel movement in a day or more. Is this medication strong enough to battle the constipation related to having an operation? Maybe for some people not prone to constipation. But for most people, other laxatives are better to prevent constipation after surgery.

## 2023-09-17 NOTE — Anesthesia Procedure Notes (Signed)
 Procedure Name: Intubation Date/Time: 09/17/2023 7:49 AM  Performed by: Darcey Nora, CRNAPre-anesthesia Checklist: Patient identified, Patient being monitored, Timeout performed, Emergency Drugs available and Suction available Patient Re-evaluated:Patient Re-evaluated prior to induction Oxygen Delivery Method: Circle system utilized Preoxygenation: Pre-oxygenation with 100% oxygen Induction Type: IV induction Ventilation: Mask ventilation without difficulty Laryngoscope Size: Mac and 3 Grade View: Grade I Tube type: Oral Tube size: 7.0 mm Number of attempts: 1 Airway Equipment and Method: Stylet Placement Confirmation: ETT inserted through vocal cords under direct vision, positive ETCO2 and breath sounds checked- equal and bilateral Secured at: 21 cm Tube secured with: Tape Dental Injury: Teeth and Oropharynx as per pre-operative assessment  Comments: DL x1 with MAC 3 blade, grade 1 view. Atraumatic intubation. Dentition unchanged from preop baseline. Dr. Genoveva Ill present.

## 2023-09-17 NOTE — Anesthesia Preprocedure Evaluation (Signed)
 Anesthesia Evaluation  Patient identified by MRN, date of birth, ID band Patient awake    Reviewed: Allergy & Precautions, H&P , NPO status , Patient's Chart, lab work & pertinent test results, reviewed documented beta blocker date and time   History of Anesthesia Complications (+) PONV and history of anesthetic complications  Airway Mallampati: II  TM Distance: >3 FB Neck ROM: full    Dental  (+) Dental Advidsory Given, Teeth Intact   Pulmonary neg pulmonary ROS, former smoker   Pulmonary exam normal breath sounds clear to auscultation       Cardiovascular Exercise Tolerance: Good (-) hypertension(-) angina (-) Past MI and (-) Cardiac Stents Normal cardiovascular exam+ dysrhythmias (POTS) (-) Valvular Problems/Murmurs Rhythm:regular Rate:Normal     Neuro/Psych  PSYCHIATRIC DISORDERS Anxiety Depression    negative neurological ROS     GI/Hepatic Neg liver ROS,GERD  ,,  Endo/Other  negative endocrine ROS    Renal/GU negative Renal ROS  negative genitourinary   Musculoskeletal   Abdominal   Peds  Hematology negative hematology ROS (+)   Anesthesia Other Findings Past Medical History: No date: Anemia No date: Anxiety 01/10/2015: Bladder infection, acute     Comment:  completed antibiotics and denies any s&s of infection               (01-30-15) No date: Depression No date: Dysrhythmia     Comment:  occ irregular heart beat per pt No date: Endometriosis 01/29/2015: Gallstones No date: GERD (gastroesophageal reflux disease)     Comment:  NO MEDS No date: Headache     Comment:  MIGRAINES DAILY No date: PONV (postoperative nausea and vomiting) No date: POTS (postural orthostatic tachycardia syndrome) No date: Vitamin D deficiency   Reproductive/Obstetrics negative OB ROS                             Anesthesia Physical Anesthesia Plan  ASA: 2  Anesthesia Plan: General   Post-op  Pain Management:    Induction: Intravenous  PONV Risk Score and Plan: 4 or greater and Ondansetron, Dexamethasone, Midazolam and Treatment Woo vary due to age or medical condition  Airway Management Planned: Oral ETT  Additional Equipment:   Intra-op Plan:   Post-operative Plan: Extubation in OR  Informed Consent: I have reviewed the patients History and Physical, chart, labs and discussed the procedure including the risks, benefits and alternatives for the proposed anesthesia with the patient or authorized representative who has indicated his/her understanding and acceptance.     Dental Advisory Given  Plan Discussed with: Anesthesiologist, CRNA and Surgeon  Anesthesia Plan Comments:        Anesthesia Quick Evaluation

## 2023-09-17 NOTE — Op Note (Signed)
 [name] PROCEDURE DATE: 09/17/2023  PREOPERATIVE DIAGNOSIS: Chronic pelvic pain, endometriosis, pelvic adhesive disease. POSTOPERATIVE DIAGNOSIS: The same PROCEDURE:  Panel 1 HYSTERECTOMY, TOTAL, LAPAROSCOPIC, ROBOT-ASSISTED: 58571 (CPT) LYSIS, ADHESIONS, ROBOT-ASSISTED, LAPAROSCOPIC:  CYSTOSCOPY: 52000 (CPT) Panel 2 APPENDECTOMY, ROBOT-ASSISTED, LAPAROSCOPIC: 44970 (CPT) LYSIS, ADHESIONS, ROBOT-ASSISTED, LAPAROSCOPIC:   This is a joint case with Dr. Hazle Quant, of general surgery, who performed lysis of bowel adhesions and an appendectomy.  SURGEON:  Dr. Christeen Douglas, MD ASSISTANT: Dr. Thomasene Mohair, MD Anesthesiologist:  Anesthesiologist: Lenard Simmer, MD CRNA: Darcey Nora, CRNA; Jaye Beagle, CRNA  INDICATIONS: 28 y.o. F  here for definitive surgical management secondary to the indications listed under preoperative diagnoses; please see preoperative note for further details.  Risks of surgery were discussed with the patient including but not limited to: bleeding which Schalk require transfusion or reoperation; infection which Web require antibiotics; injury to bowel, bladder, ureters or other surrounding organs; need for additional procedures; thromboembolic phenomenon, incisional problems and other postoperative/anesthesia complications. Written informed consent was obtained.    FINDINGS:    External genitalia, vaginal canal and cervix negative for lesions.  The uterus was small and densely adherent to the posterior bowel.  The posterior cul-de-sac was completely obliterated.    The right ovary was adhesed to the ovarian fossa, the broad ligament, and the sigmoid bowel on the right side.  This remained mostly in place, still adherent to the pelvic sidewall and with omentum and epiploica remaining on it at the close of the case.  It was superior to the ureter.  The left ovary was also adherent to the bowel and the ovarian fossa, but not to the uterus were  broad ligament..   Bilateral fallopian tubes were missing   Appendix looked normal.  There was some scar tissue.  Please see general surgeon note for removal of the appendix.     ANESTHESIA:    General INTRAVENOUS FLUIDS: 700 ml ESTIMATED BLOOD LOSS:30 ml URINE OUTPUT: 150 ml  SPECIMENS: Uterus, cervix, bilateral fallopian tubes remnant, although prior bilateral salpingectomy meant there was minimal tube on each side. Appendix was also sent  COMPLICATIONS: None immediate   RATLH/BS:  PROCEDURE IN DETAIL: After informed consent was obtained, the patient was taken to the operating room where general anesthesia was obtained without difficulty. The patient was positioned in the dorsal lithotomy position in [place] stirrups and her arms were carefully tucked at her sides and the usual precautions were taken. Deep Trendelenburg (20-25 deg) was established to confirm that she does not shift on the table.  She was prepped and draped in normal sterile fashion.  Time-out was performed and a Foley catheter was placed into the bladder. A standard VCare uterine manipulator was then placed in the uterus without incident.  Preoperative prophylactic antibiotics were given through her iv.  After infiltration of local anesthetic at the proposed trocar sites, an 8 mm incision was created at the midline above the umbilicus, and an AirSeal 5mm was placed under direct visualization, after confirmation of OG tube working well. Pneumoperitoneum was created to a pressure of 15 mm Hg. The camera was placed and the abdomin surveyed, noting intact bowel below the site of entry. A survey of the pelvis and upper abdomen revealed the above findings. One right and one left lateral 8-mm robotic ports were placed under direct visualization.  Was accomplished with both surgeons.  The patient was placed in deepTrendelenburg and the bowel was displaced up into the upper abdomen. The robot was left  side docked. The instruments  were placed under direct visualization.   General surgery performed the appendectomy first, and significant lysis of adhesions between the pelvic organs and the bowel.  At the close of his case, bleeding was minimal and the patient was stable.  I replaced him at the robotic console.  The ureters were identified bilaterally coursing outside of the operative field.  We started at the bladder flap, and this was carefully developed to allow for visualization of the lateral vessels of the uterine arteries.  These vessels were enlarged and tortuous, and with the scar tissue and adhesions around them, they were carefully dissected when it is time.    Round ligaments were divided on each side with the EndoShears and the retroperitoneal space was opened bilaterally. The posterior leaflet of the broad was taken down to the level of the IP ligament. The anterior leaflet of the broad ligament was carefully taken down to the midline.   The remnant of the fallopian tubes were divided from the ovaries, and care taken to hemostatically transect the utero-ovarian ligament. The peritoneum was taken down to the level of the internal os, and the uterine arteries skeletonized. With strong cephalad pressure from the V-care, bipolar cautery was used to seal and transect the uterine arteries, and the pedicles allowed to fall away laterally.   A colpotomy was performed circumferentially along the V-Care ring with monopolar electrocautery and the cervix was incised from the vagina using the laparoscopic scissors. The specimen was removed through the vagina.  A pneumo balloon was placed in the vagina and the vaginal cuff was then closed in a running continuous fashion using the  0 V-Lock suture with careful attention to include the vaginal cuff angles, the uterosacral ligaments and the vaginal mucosa within the closure.  Hemostasis was secured with intraabdominal pressure and review of all surgical sites. The  intraperitoneal pressure was dropped, and all planes of dissection, vascular pedicles and the vaginal cuff were found to be hemostatic.  The robot was undocked.   Attention was turned to the bladder and cystoscopy showed vigorous bilateral ureteral jets.  No stitches were visualized in the bladder during cystoscopy.  The lateral trocars were removed under visualization.  The CO2 gas was released and several deep breaths given to remove any remaining CO2 from the peritoneal cavity.  The skin incisions were closed with 4-0 Monocryl subcuticular stitch and Dermabond.    Anesthesia was reversed without difficulty.  The patient tolerated the procedure well.  Sponge, lap and needle counts were correct x2.  The patient was taken to recovery room in excellent condition.

## 2023-09-18 ENCOUNTER — Encounter: Payer: Self-pay | Admitting: Obstetrics and Gynecology

## 2023-09-18 LAB — SURGICAL PATHOLOGY

## 2023-09-18 NOTE — Anesthesia Postprocedure Evaluation (Signed)
 Anesthesia Post Note  Patient: [name]  Procedure(s) Performed: HYSTERECTOMY, TOTAL, LAPAROSCOPIC, ROBOT-ASSISTED (Abdomen) LYSIS, ADHESIONS, ROBOT-ASSISTED, LAPAROSCOPIC (Abdomen) CYSTOSCOPY (Bladder) APPENDECTOMY, ROBOT-ASSISTED, LAPAROSCOPIC (Abdomen) LYSIS, ADHESIONS, ROBOT-ASSISTED, LAPAROSCOPIC (Abdomen)  Patient location during evaluation: PACU Anesthesia Type: General Level of consciousness: awake and alert Pain management: pain level controlled Vital Signs Assessment: post-procedure vital signs reviewed and stable Respiratory status: spontaneous breathing, nonlabored ventilation, respiratory function stable and patient connected to nasal cannula oxygen Cardiovascular status: blood pressure returned to baseline and stable Postop Assessment: no apparent nausea or vomiting Anesthetic complications: no   No notable events documented.   Last Vitals:  Vitals:   09/17/23 1119 09/17/23 1144  BP:  98/61  Pulse: 80 (!) 55  Resp: 15 16  Temp: (!) 36.1 C 36.8 C  SpO2: 99% 99%    Last Pain:  Vitals:   09/17/23 1144  TempSrc: Temporal  PainSc:                  Lenard Simmer

## 2024-01-26 ENCOUNTER — Ambulatory Visit: Admitting: Family Medicine

## 2024-01-29 ENCOUNTER — Ambulatory Visit (INDEPENDENT_AMBULATORY_CARE_PROVIDER_SITE_OTHER): Admitting: Family Medicine

## 2024-01-29 ENCOUNTER — Encounter: Payer: Self-pay | Admitting: Family Medicine

## 2024-01-29 VITALS — BP 121/80 | HR 100 | Temp 98.1°F | Resp 18 | Ht 61.0 in | Wt 176.0 lb

## 2024-01-29 DIAGNOSIS — N760 Acute vaginitis: Secondary | ICD-10-CM

## 2024-01-29 DIAGNOSIS — R197 Diarrhea, unspecified: Secondary | ICD-10-CM

## 2024-01-29 DIAGNOSIS — N62 Hypertrophy of breast: Secondary | ICD-10-CM | POA: Diagnosis not present

## 2024-01-29 DIAGNOSIS — K219 Gastro-esophageal reflux disease without esophagitis: Secondary | ICD-10-CM | POA: Diagnosis not present

## 2024-01-29 MED ORDER — PANTOPRAZOLE SODIUM 20 MG PO TBEC: 20.0000 mg | DELAYED_RELEASE_TABLET | Freq: Every day | ORAL | 1 refills | Status: DC

## 2024-01-29 NOTE — Assessment & Plan Note (Signed)
 At least some of her diarrhea can be explained with bile acids in the GI track increasing colonic speed and causing watery diarrhea.  There Desjardin be some component of GERD as she does have reflux.  Will give her a trial of pantoprazole  and see if that helps.  Ask her to make a food diary detailing what she eats and whether or not she has immediate diarrhea.  Follow-up in a month

## 2024-01-29 NOTE — Assessment & Plan Note (Signed)
 She would like to see a plastic surgeon about breast reduction surgery.

## 2024-01-29 NOTE — Assessment & Plan Note (Signed)
 Will give her a trial of pantoprazole  20 mg daily and see if that reduces some of the dumping syndrome.

## 2024-01-29 NOTE — Assessment & Plan Note (Signed)
 She denies itching, burning sensation or vaginal discharge.  She does have an odor from time to time.  Will check for BV.

## 2024-01-29 NOTE — Progress Notes (Signed)
 Established Patient Office Visit  Subjective   Patient ID: [name], female    DOB: 1995/11/25  Age: 28 y.o. MRN: [id number]  Chief Complaint  Patient presents with   Diarrhea   Breast Problem    Too heavy    Diarrhea    Discussed the use of AI scribe software for clinical note transcription with the patient, who gave verbal consent to proceed.  History of Present Illness   [name] is a 28 year old female who presents with chronic diarrhea and breast pain.  She has experienced chronic diarrhea since undergoing a cholecystectomy approximately eight years ago. The diarrhea is unpredictable, sometimes triggered by small amounts of food or even water, and other times not triggered by larger meals. She reports that greasy foods, such as meals from [facility] with cheese and [place] fries, often cause her to have to use the bathroom quickly and experience an upset stomach. Her stools are watery and soft. She does not monitor her fiber intake and is not currently on any medication for reflux, which she also experiences.  She reports significant breast pain, associated with rashes and persistent back pain. She wants to discuss the possibility of breast reduction surgery due to the discomfort and impact on her daily activities, including exercise. Her breast size increased significantly after having children, contributing to her back pain.  She mentions a concern about a vaginal odor, which she associates with a possible pH imbalance. No burning sensation or frequent urination.      Review of Systems  Gastrointestinal:  Positive for diarrhea.      Objective:     BP 121/80   Pulse 100   Temp 98.1 F (36.7 C) (Oral)   Resp 18   Ht 5' 1 (1.549 m)   Wt 176 lb (79.8 kg)   LMP  (LMP Unknown)   SpO2 97%   Breastfeeding No   BMI 33.25 kg/m    Physical Exam Vitals and nursing note reviewed.  Constitutional:      Appearance: Normal appearance.  HENT:     Head:  Normocephalic and atraumatic.  Eyes:     Conjunctiva/sclera: Conjunctivae normal.  Cardiovascular:     Rate and Rhythm: Normal rate and regular rhythm.  Pulmonary:     Effort: Pulmonary effort is normal.     Breath sounds: Normal breath sounds.  Musculoskeletal:     Right lower leg: No edema.     Left lower leg: No edema.  Skin:    General: Skin is warm and dry.  Neurological:     Mental Status: She is alert and oriented to person, place, and time.  Psychiatric:        Mood and Affect: Mood normal.        Behavior: Behavior normal.        Thought Content: Thought content normal.        Judgment: Judgment normal.          No results found for any visits on 01/29/24.    The ASCVD Risk score (Arnett DK, et al., 2019) failed to calculate for the following reasons:   The 2019 ASCVD risk score is only valid for ages 79 to 28    Assessment & Plan:  Acute vaginitis Assessment & Plan: She denies itching, burning sensation or vaginal discharge.  She does have an odor from time to time.  Will check for BV.  Orders: -     NuSwab Vaginitis Plus (VG+)  Diarrhea, unspecified type Assessment & Plan: At least some of her diarrhea can be explained with bile acids in the GI track increasing colonic speed and causing watery diarrhea.  There Bye be some component of GERD as she does have reflux.  Will give her a trial of pantoprazole  and see if that helps.  Ask her to make a food diary detailing what she eats and whether or not she has immediate diarrhea.  Follow-up in a month   Gastroesophageal reflux disease, unspecified whether esophagitis present Assessment & Plan: Will give her a trial of pantoprazole  20 mg daily and see if that reduces some of the dumping syndrome.  Orders: -     Pantoprazole  Sodium; Take 1 tablet (20 mg total) by mouth daily.  Dispense: 30 tablet; Refill: 1  Large breasts Assessment & Plan: She would like to see a plastic surgeon about breast reduction  surgery.  Orders: -     Ambulatory referral to Plastic Surgery     Return in about 4 weeks (around 02/26/2024).    Ramah Langhans K Saniyyah Elster, MD

## 2024-02-01 ENCOUNTER — Encounter: Payer: Self-pay | Admitting: Family Medicine

## 2024-02-01 LAB — NUSWAB VAGINITIS PLUS (VG+)
Candida albicans, NAA: NEGATIVE
Candida glabrata, NAA: NEGATIVE
Chlamydia trachomatis, NAA: NEGATIVE
Neisseria gonorrhoeae, NAA: NEGATIVE
Trich vag by NAA: NEGATIVE

## 2024-02-02 ENCOUNTER — Other Ambulatory Visit: Payer: Self-pay | Admitting: Family Medicine

## 2024-02-02 ENCOUNTER — Ambulatory Visit: Payer: Self-pay | Admitting: Family Medicine

## 2024-02-02 DIAGNOSIS — N62 Hypertrophy of breast: Secondary | ICD-10-CM

## 2024-02-04 ENCOUNTER — Institutional Professional Consult (permissible substitution)

## 2024-02-29 ENCOUNTER — Ambulatory Visit: Admitting: Family Medicine

## 2024-03-08 ENCOUNTER — Encounter: Payer: Self-pay | Admitting: Family Medicine

## 2024-03-08 ENCOUNTER — Ambulatory Visit (INDEPENDENT_AMBULATORY_CARE_PROVIDER_SITE_OTHER): Admitting: Family Medicine

## 2024-03-08 VITALS — BP 123/79 | HR 71 | Temp 98.0°F | Resp 20 | Ht 61.0 in | Wt 176.0 lb

## 2024-03-08 DIAGNOSIS — G90A Postural orthostatic tachycardia syndrome (POTS): Secondary | ICD-10-CM | POA: Diagnosis not present

## 2024-03-08 DIAGNOSIS — R197 Diarrhea, unspecified: Secondary | ICD-10-CM | POA: Diagnosis not present

## 2024-03-08 DIAGNOSIS — G43909 Migraine, unspecified, not intractable, without status migrainosus: Secondary | ICD-10-CM | POA: Insufficient documentation

## 2024-03-08 DIAGNOSIS — K219 Gastro-esophageal reflux disease without esophagitis: Secondary | ICD-10-CM | POA: Diagnosis not present

## 2024-03-08 DIAGNOSIS — G43E09 Chronic migraine with aura, not intractable, without status migrainosus: Secondary | ICD-10-CM | POA: Diagnosis not present

## 2024-03-08 MED ORDER — PANTOPRAZOLE SODIUM 20 MG PO TBEC: 20.0000 mg | DELAYED_RELEASE_TABLET | Freq: Every day | ORAL | 3 refills | Status: DC

## 2024-03-08 MED ORDER — COLESTIPOL HCL 1 G PO TABS: 1.0000 g | ORAL_TABLET | Freq: Two times a day (BID) | ORAL | 11 refills | Status: DC

## 2024-03-08 NOTE — Assessment & Plan Note (Signed)
 Believes she has rebound migraines from the pattern of analgesics she has been taking.  Encouraged her to stop taking analgesics.  Discussed Topamax but she was concerned and did not want to take it.  Beta-blockers and calcium  channel blockers are relatively contraindicated because she has POTS.

## 2024-03-08 NOTE — Assessment & Plan Note (Signed)
 She has chronic diarrhea regardless of what she eats.  Sometimes the diarrhea is within 5 minutes of eating and sometimes it is 30 minutes later but she always has diarrhea.  Try colestipol 1 to 2 mg a day.

## 2024-03-08 NOTE — Assessment & Plan Note (Signed)
 She has been diagnosed as having POTS and was told to drink water and eat salt.  She has been a little bit more symptomatic in the last few weeks and she admits she has not been drinking as much fluids.

## 2024-03-08 NOTE — Progress Notes (Signed)
 Established Patient Office Visit  Subjective   Patient ID: [name], female    DOB: 02/10/1996  Age: 28 y.o. MRN: [id number]  Chief Complaint  Patient presents with   Medical Management of Chronic Issues   Dizziness   Fatigue   Tremors   Migraine    Dizziness  Migraine  Associated symptoms include dizziness.   She is here with her mother [name].  She is complaining of dizziness, fatigue, tremors and migraine headaches.  She is also complaining of diarrhea every time she eats and the pain that is experienced with the diarrhea.  She did a journal of her food but forgot to bring it.  Pantoprazole  did not help with the diarrhea.  She did not start any fiber. She has a migraine every day she has never seen a neurologist.  She has never taken medicine for her migraines because they were concerned it would lower her blood pressure and she already has POTS.  She will feel her heart rate get very fast and then slow down abruptly.  She was not given any medicine to take for POTS but was told to eat salt and drink more water.  She had an extensive workup including seizure EEG.  She also had a CT and an MRI of the brain. She feels like she is going to pass out.  She gets pale and diaphoretic.  She does not have chest pains when she gets this sensation. She takes ibuprofen  600 to 800 mg more than once a day for her chronic migraines.  She also takes Tylenol  as needed for her migraines.  She has a migraine every day and she takes medication for migraines multiple times a day.    Review of Systems  Neurological:  Positive for dizziness.      Objective:     BP 123/79 (BP Location: Left Arm, Patient Position: Sitting, Cuff Size: Normal)   Pulse 71   Temp 98 F (36.7 C) (Oral)   Resp 20   Ht 5' 1 (1.549 m)   Wt 176 lb (79.8 kg)   LMP  (LMP Unknown)   SpO2 97%   BMI 33.25 kg/m    Physical Exam Vitals and nursing note reviewed.  Constitutional:      Appearance: Normal  appearance.  HENT:     Head: Normocephalic and atraumatic.  Eyes:     Conjunctiva/sclera: Conjunctivae normal.  Cardiovascular:     Rate and Rhythm: Normal rate and regular rhythm.  Pulmonary:     Effort: Pulmonary effort is normal.     Breath sounds: Normal breath sounds.  Musculoskeletal:     Right lower leg: No edema.     Left lower leg: No edema.  Skin:    General: Skin is warm and dry.  Neurological:     Mental Status: She is alert and oriented to person, place, and time.  Psychiatric:        Mood and Affect: Mood normal.        Behavior: Behavior normal.        Thought Content: Thought content normal.        Judgment: Judgment normal.          No results found for any visits on 03/08/24.    The ASCVD Risk score (Arnett DK, et al., 2019) failed to calculate for the following reasons:   The 2019 ASCVD risk score is only valid for ages 56 to 36    Assessment & Plan:  Diarrhea, unspecified type Assessment & Plan: She has chronic diarrhea regardless of what she eats.  Sometimes the diarrhea is within 5 minutes of eating and sometimes it is 30 minutes later but she always has diarrhea.  Try colestipol 1 to 2 mg a day.  Orders: -     Colestipol HCl; Take 1 tablet (1 g total) by mouth 2 (two) times daily.  Dispense: 60 tablet; Refill: 11  Gastroesophageal reflux disease, unspecified whether esophagitis present -     Pantoprazole  Sodium; Take 1 tablet (20 mg total) by mouth daily.  Dispense: 90 tablet; Refill: 3  POTS (postural orthostatic tachycardia syndrome) Assessment & Plan: She has been diagnosed as having POTS and was told to drink water and eat salt.  She has been a little bit more symptomatic in the last few weeks and she admits she has not been drinking as much fluids.   Chronic migraine with aura without status migrainosus, not intractable Assessment & Plan: Believes she has rebound migraines from the pattern of analgesics she has been taking.  Encouraged  her to stop taking analgesics.  Discussed Topamax but she was concerned and did not want to take it.  Beta-blockers and calcium  channel blockers are relatively contraindicated because she has POTS.      Return in about 4 weeks (around 04/05/2024).    Mackenzie Chaviano K Kishia Shackett, MD

## 2024-04-05 ENCOUNTER — Ambulatory Visit: Admitting: Family Medicine

## 2024-04-18 ENCOUNTER — Emergency Department

## 2024-04-18 ENCOUNTER — Telehealth: Payer: Self-pay | Admitting: Family Medicine

## 2024-04-18 ENCOUNTER — Other Ambulatory Visit: Payer: Self-pay

## 2024-04-18 ENCOUNTER — Ambulatory Visit: Payer: Self-pay

## 2024-04-18 ENCOUNTER — Emergency Department
Admission: EM | Admit: 2024-04-18 | Discharge: 2024-04-18 | Disposition: A | Discharge status: Home / Self Care | Attending: Emergency Medicine | Admitting: Emergency Medicine

## 2024-04-18 DIAGNOSIS — R0789 Other chest pain: Secondary | ICD-10-CM | POA: Diagnosis not present

## 2024-04-18 DIAGNOSIS — R079 Chest pain, unspecified: Secondary | ICD-10-CM | POA: Diagnosis present

## 2024-04-18 LAB — BASIC METABOLIC PANEL WITH GFR
Anion gap: 13 (ref 5–15)
BUN: 13 mg/dL (ref 6–20)
CO2: 22 mmol/L (ref 22–32)
Calcium: 8.9 mg/dL (ref 8.9–10.3)
Chloride: 104 mmol/L (ref 98–111)
Creatinine, Ser: 0.81 mg/dL (ref 0.44–1.00)
GFR, Estimated: >60 mL/min (ref >60)
Glucose, Bld: 114 mg/dL — ABNORMAL HIGH (ref 70–99)
Potassium: 4.1 mmol/L (ref 3.5–5.1)
Sodium: 139 mmol/L (ref 135–145)

## 2024-04-18 LAB — CBC
HCT: 44.6 % (ref 36.0–46.0)
Hemoglobin: 14.3 g/dL (ref 12.0–15.0)
MCH: 27.9 pg (ref 26.0–34.0)
MCHC: 32.1 g/dL (ref 30.0–36.0)
MCV: 86.9 fL (ref 80.0–100.0)
Platelets: 219 K/uL (ref 150–400)
RBC: 5.13 MIL/uL — ABNORMAL HIGH (ref 3.87–5.11)
RDW: 12.7 % (ref 11.5–15.5)
WBC: 7.6 K/uL (ref 4.0–10.5)
nRBC: 0 % (ref 0.0–0.2)

## 2024-04-18 LAB — TROPONIN I (HIGH SENSITIVITY): Troponin I (High Sensitivity): <2 ng/L (ref <18)

## 2024-04-18 MED ORDER — HYDROXYZINE HCL 10 MG PO TABS: 10.0000 mg | ORAL_TABLET | Freq: Three times a day (TID) | ORAL | 0 refills | Status: AC | PRN

## 2024-04-18 MED ORDER — ONDANSETRON 4 MG PO TBDP: 4.0000 mg | ORAL_TABLET | Freq: Three times a day (TID) | ORAL | 0 refills | Status: DC | PRN

## 2024-04-18 NOTE — ED Notes (Signed)
 Pt to radiology via wheelchair

## 2024-04-18 NOTE — Telephone Encounter (Signed)
 FYI Only or Action Required?: Action required by provider: clinical question for provider. Pt refusing ED, call transferred to [facility] to speak with pt  Patient was last seen in primary care on 03/08/2024 by Ziglar, Susan K, MD.  Called Nurse Triage reporting Panic Attack.  Symptoms began several weeks ago.  Interventions attempted: Rest, hydration, or home remedies.  Symptoms are: gradually worsening.  Triage Disposition: Go to ED Now (Notify PCP)  Patient/caregiver understands and will follow disposition?: No, wishes to speak with PCP  Copied from CRM #[id number]. Topic: Clinical - Red Word Triage >> Apr 18, 2024  9:46 AM Robinson H wrote: [facility] that prompted transfer to Nurse Triage: Patient having really bad anxiety and panic attacks Reason for Disposition  [1] Chest pain (or angina) comes and goes AND [2] is happening more often (increasing in frequency) or getting worse (increasing in severity)  (Exception: Chest pains that last only a few seconds.)  Answer Assessment - Initial Assessment Questions Pt with long standing hx of anxiety and panic attacks reports increased anxiety over the past 3 weeks and panic attack  2 weeks ago. Reports it's the first panic attack she's had in 3 years. Current anxiety level 6/10. At home address with mom currently. Able to care for self but has not been going to work. Asking to make appt to discuss anxiety medication options. Also reports new onset left sided sharp 7/10 chest pain that wraps around to left shoulder blade 2 weeks ago. Occurs about 1x/day and lasts up to 5 minutes. Occurs randomly and also with episodes increased anxiety. Sob also present with anxiety. Advised ED, declines. Called [facility] and transferred pt to [facility] to let pt know a nurse would be in contact.   1. CONCERN: Did anything happen that prompted you to call today?      Increased anxiety and panic attacks.  2. ANXIETY SYMPTOMS: Can you describe how you (your loved one; patient)  have been feeling? (e.g., tense, restless, panicky, anxious, keyed up, overwhelmed, sense of impending doom).      Panic and like you're going to die  3. ONSET: How long have you been feeling this way? (e.g., hours, days, weeks)     Lifelong. Anxiety comes and goes  4. SEVERITY: How would you rate the level of anxiety? (e.g., 0 - 10; or mild, moderate, severe).     Currently a 6/10  5. FUNCTIONAL IMPAIRMENT: How have these feelings affected your ability to do daily activities? Have you had more difficulty than usual doing your normal daily activities? (e.g., getting better, same, worse; self-care, school, work, interactions)     Hasn't been going to work d/t anxiety, has been caring for self.  6. HISTORY: Have you felt this way before? Have you ever been diagnosed with an anxiety problem in the past? (e.g., generalized anxiety disorder, panic attacks, PTSD). If Yes, ask: How was this problem treated? (e.g., medicines, counseling, etc.)     Hx of anxiety and panic attacks  7. RISK OF HARM - SUICIDAL IDEATION: Do you ever have thoughts of hurting or killing yourself? If Yes, ask:  Do you have these feelings now? Do you have a plan on how you would do this?     Denies  8. TREATMENT:  What has been done so far to treat this anxiety? (e.g., medicines, relaxation strategies). What has helped?     Denies  9. THERAPIST: Do you have a counselor or therapist? If Yes, ask: What is their name?  Denies  10. POTENTIAL TRIGGERS: Do you drink caffeinated beverages (e.g., coffee, colas, teas), and how much daily? Do you drink alcohol or use any drugs? Have you started any new medicines recently?       General life stress  11. PATIENT SUPPORT: Who is with you now? Who do you live with? Do you have family or friends who you can talk to?        Yes. Currently with mom  12. OTHER SYMPTOMS: Do you have any other symptoms? (e.g., feeling depressed, trouble  concentrating, trouble sleeping, trouble breathing, palpitations or fast heartbeat, chest pain, sweating, nausea, or diarrhea)       Depression, difficulty sleeping and with concentration. SOB with anxiety. Recent nausea, taking zofran . Left sided sharp 7/10 CP that wraps around to back and  shoulder blade. Onset a few weeks ago. Lasts 5-10 minutes. Onset out of nowhere and also when panicking. Went to the hospital for CP several years ago, though this time the CP feels different.  13. PREGNANCY: Is there any chance you are pregnant? When was your last menstrual period?       Denies. Had hysterectomy  Answer Assessment - Initial Assessment Questions 1. LOCATION: Where does it hurt?       Left chest that radiates to left shoulder blade  2. RADIATION: Does the pain go anywhere else? (e.g., into neck, jaw, arms, back)     Radiates to left shoulder blade  3. ONSET: When did the chest pain begin? (Minutes, hours or days)      A few weeks ago.  4. PATTERN: Does the pain come and go, or has it been constant since it started?  Does it get worse with exertion?      Intermittent, about once per day. Comes on randomly but also with anxiety  5. DURATION: How long does it last (e.g., seconds, minutes, hours)     5 minutes  6. SEVERITY: How bad is the pain?  (e.g., Scale 1-10; mild, moderate, or severe)     7/10  7. CARDIAC RISK FACTORS: Do you have any history of heart problems or risk factors for heart disease? (e.g., angina, prior heart attack; diabetes, high blood pressure, high cholesterol, smoker, or strong family history of heart disease)     POTS  8. PULMONARY RISK FACTORS: Do you have any history of lung disease?  (e.g., blood clots in lung, asthma, emphysema, birth control pills)     *No Answer*  9. CAUSE: What do you think is causing the chest pain?     Anxiety  10. OTHER SYMPTOMS: Do you have any other symptoms? (e.g., dizziness, nausea, vomiting, sweating,  fever, difficulty breathing, cough)       SOB. Ongoing recent nausea.  11. PREGNANCY: Is there any chance you are pregnant? When was your last menstrual period?       Denies. Has had hysterectomy  Protocols used: Anxiety and Panic Attack-A-AH, Chest Pain-A-AH

## 2024-04-18 NOTE — ED Notes (Signed)
 ED Provider at bedside.

## 2024-04-18 NOTE — ED Notes (Signed)
 Patient ambulatory and out of bed as tol.Watching TV in room. No distress.

## 2024-04-18 NOTE — ED Triage Notes (Addendum)
 Pt to ED via POV from home. Pt advised to come in by doctor for intermittent CP that wraps around to her back x3 wks. Pt denies pain on arrival. Pt hx of POTS and anxiety. Pt reports increased anxiety.

## 2024-04-18 NOTE — ED Provider Notes (Signed)
 [facility] Emergency Department Provider Note     Event Date/Time   First MD Initiated Contact with Patient 04/18/24 1103     (approximate)   History   Chest Pain   HPI  [name] is a 28 y.o. female with a PMHx anxiety, GERD, dysrhythmia and POTS presents to the ED with complaint of intermittent central chest pain described as a chest cold with radiation around to her left scapula  x 3 week. Pain duration lasts 5- 7 minutes and are non-specific per patient. Patient has concerns of her recurrent panic attacks causing these chest pains. She was advised by her primary provider to seek ED evaluation. Denies shortness of breath, syncopal episodes, dizziness, fever, nausea and vomiting.     Physical Exam   Triage Vital Signs: ED Triage Vitals  Encounter Vitals Group     BP 04/18/24 1049 (!) 133/98     Girls Systolic BP Percentile --      Girls Diastolic BP Percentile --      Boys Systolic BP Percentile --      Boys Diastolic BP Percentile --      Pulse Rate 04/18/24 1049 79     Resp 04/18/24 1049 18     Temp 04/18/24 1051 97.8 F (36.6 C)     Temp Source 04/18/24 1051 Oral     SpO2 04/18/24 1049 98 %     Weight 04/18/24 1102 175 lb (79.4 kg)     Height 04/18/24 1102 5' 1 (1.549 m)     Head Circumference --      Peak Flow --      Pain Score 04/18/24 1049 0     Pain Loc --      Pain Education --      Exclude from Growth Chart --     Most recent vital signs: Vitals:   04/18/24 1051 04/18/24 1236  BP:  128/88  Pulse:  70  Resp:  16  Temp: 97.8 F (36.6 C) 97.8 F (36.6 C)  SpO2:  100%   General Awake, no distress. Well appearing HEENT NCAT. PERRL. EOMI.  CV:  Good peripheral perfusion. RRR. reproducible pain to right side anterior chest wall. Radial pulses equal bilaterally 2+  RESP:  Normal effort. LCTAB ABD:  No distention. Soft, non tender   ED Results / Procedures / Treatments   Labs (all labs ordered are listed, but only  abnormal results are displayed) Labs Reviewed  BASIC METABOLIC PANEL WITH GFR - Abnormal; Notable for the following components:      Result Value   Glucose, Bld 114 (*)    All other components within normal limits  CBC - Abnormal; Notable for the following components:   RBC 5.13 (*)    All other components within normal limits  TROPONIN I (HIGH SENSITIVITY)  TROPONIN I (HIGH SENSITIVITY)    EKG  Normal sinus rhythm with sinus arrhythmia   RADIOLOGY  I personally viewed and evaluated these images as part of my medical decision making, as well as reviewing the written report by the radiologist.  ED Provider Interpretation: Normal appearing chest xray   DG Chest 2 View Result Date: 04/18/2024 EXAM: 2 VIEW(S) XRAY OF THE CHEST 04/18/2024 11:13:00 AM COMPARISON: Chest radiograph 02/28/2015. CLINICAL HISTORY: cp FINDINGS: LUNGS AND PLEURA: No focal pulmonary opacity. No pulmonary edema. No pleural effusion. No pneumothorax. HEART AND MEDIASTINUM: No acute abnormality of the cardiac and mediastinal silhouettes. BONES AND SOFT TISSUES: Right  upper quadrant cholecystectomy clips. No acute osseous abnormality. IMPRESSION: 1. No acute cardiopulmonary process. Electronically signed by: Donnice Mania MD 04/18/2024 12:01 PM EST RP Workstation: [id number]   PROCEDURES:  Critical Care performed: No  Procedures  MEDICATIONS ORDERED IN ED: Medications - No data to display  IMPRESSION / MDM / ASSESSMENT AND PLAN / ED COURSE  I reviewed the triage vital signs and the nursing notes.                               28 y.o. female presents to the emergency department for evaluation and treatment of subacute non-specific chest pain. See HPI for further details.   Differential diagnosis includes, but is not limited to, ACS, PNA, PNX, PE, pericarditis, myocarditis, musculoskeletal chest wall pain, .     Patient's presentation is most consistent with acute complicated illness / injury requiring  diagnostic workup.  Patient is alert and oriented.  She is hemodynamic stable.  Physical exam findings are as stated above and overall benign.  There is reproducible chest wall pain noted.  Lab work is reassuring.  Troponin <2.  Chest x-ray is reassuring.  EKG reveals normal sinus rhythm with sinus arrhythmia.  PE considered, however less likely with absence of sob at rest, not pleuritic in nature, no hypoxia.  Patient is requesting for an as needed anti-anxiety medications until she can be referred by her PCP to a psychiatrist for further medication management.  Will send hydroxyzine .  Patient also has concerns for possible GI upset being a side effect.  She is requesting Zofran .  This is reasonable.  Patient was given reassurance of hydroxyzine .  She will follow-up with her primary care provider this Friday as she has a scheduled appointment 11/14.  She is in stable condition for discharge home.  ED return precaution discussed.  All questions and concerns were addressed during this ED visit.     FINAL CLINICAL IMPRESSION(S) / ED DIAGNOSES   Final diagnoses:  Nonspecific chest pain   Rx / DC Orders   ED Discharge Orders          Ordered    hydrOXYzine  (ATARAX ) 10 MG tablet  3 times daily PRN        04/18/24 1229    ondansetron  (ZOFRAN -ODT) 4 MG disintegrating tablet  Every 8 hours PRN        04/18/24 1229            Note:  This document was prepared using Dragon voice recognition software and [place] include unintentional dictation errors.    Margrette, Randy Castrejon A, PA-C 04/18/24 1252    Bradler, Evan K, MD 04/19/24 1332

## 2024-04-18 NOTE — Discharge Instructions (Addendum)
 Your evaluated in the ED for nonspecific chest pain.  Your lab work is reassuring.  Your troponin levels are normal.  Your EKG is reassuring.  Your chest x-ray is normal.  Please continue to follow-up with your provider on 11/14 for further management.  If any new or worsening symptoms occur return to ED for further evaluation.

## 2024-04-18 NOTE — Telephone Encounter (Signed)
 Called patient and she is in the ER.  She was having panic with chest pain so she is going to get ruled out.  When you get done with the emergency room make an appointment with me we will talk about your options for anxiety.

## 2024-04-18 NOTE — Telephone Encounter (Signed)
 Incoming call from [facility] nurse stating patient has anxiety, panic attacks. complaint of chest pain (w/ hx of chest pain) Chest pain today is different. Patient refuse to go to the ER.  Spoke with patient who refused to go to the ER or UC stating her doctor can do the same thing. Requested to make an appt.

## 2024-04-22 ENCOUNTER — Ambulatory Visit: Admitting: Family Medicine

## 2024-05-12 ENCOUNTER — Telehealth: Admitting: Physician Assistant

## 2024-05-12 DIAGNOSIS — B9689 Other specified bacterial agents as the cause of diseases classified elsewhere: Secondary | ICD-10-CM | POA: Diagnosis not present

## 2024-05-12 DIAGNOSIS — R1011 Right upper quadrant pain: Secondary | ICD-10-CM

## 2024-05-12 DIAGNOSIS — N760 Acute vaginitis: Secondary | ICD-10-CM

## 2024-05-12 DIAGNOSIS — K5900 Constipation, unspecified: Secondary | ICD-10-CM | POA: Diagnosis not present

## 2024-05-12 MED ORDER — METRONIDAZOLE 0.75 % VA GEL: 1.0000 | Freq: Every day | VAGINAL | 0 refills | Status: AC

## 2024-05-12 MED ORDER — ONDANSETRON 4 MG PO TBDP: 4.0000 mg | ORAL_TABLET | Freq: Three times a day (TID) | ORAL | 0 refills | Status: AC | PRN

## 2024-05-12 NOTE — Progress Notes (Signed)
 Virtual Visit Consent   [name], you are scheduled for a virtual visit with a [facility] provider today. Just as with appointments in the office, your consent must be obtained to participate. Your consent will be active for this visit and any virtual visit you [facility] have with one of our providers in the next 365 days. If you have a MyChart account, a copy of this consent can be sent to you electronically.  As this is a virtual visit, video technology does not allow for your provider to perform a traditional examination. This [place] limit your provider's ability to fully assess your condition. If your provider identifies any concerns that need to be evaluated in person or the need to arrange testing (such as labs, EKG, etc.), we will make arrangements to do so. Although advances in technology are sophisticated, we cannot ensure that it will always work on either your end or our end. If the connection with a video visit is poor, the visit [place] have to be switched to a telephone visit. With either a video or telephone visit, we are not always able to ensure that we have a secure connection.  By engaging in this virtual visit, you consent to the provision of healthcare and authorize for your insurance to be billed (if applicable) for the services provided during this visit. Depending on your insurance coverage, you [place] receive a charge related to this service.  I need to obtain your verbal consent now. Are you willing to proceed with your visit today? [name] has provided verbal consent on 05/12/2024 for a virtual visit (video or telephone). [name], [place]  Date: 05/12/2024 3:19 PM   Virtual Visit via Video Note   I, [name], connected with  [name]  ([id number], [birth year]) on 05/12/24 at  3:00 PM EST by a video-enabled telemedicine application and verified that I am speaking with the correct person using two identifiers.  Location: Patient: Virtual Visit Location  Patient: Home Provider: Virtual Visit Location Provider: Home Office   I discussed the limitations of evaluation and management by telemedicine and the availability of in person appointments. The patient expressed understanding and agreed to proceed.    History of Present Illness: [name] is a 28 y.o. who identifies as a female who was assigned female at birth, and is being seen today for intermittent pain of right upper abdomen and side over the past 2 weeks.  Notes symptoms Connick come and go.  Is associated with some constipation with last bowel movement being 4 days ago.  Notes some nausea without any episodes of emesis.  Denies fever or chills.  Denies melena, hematochezia or tenesmus.  Has a history of reflux, but denies any issue with that currently.  Is status postcholecystectomy..  Separately, she has started to notice some atypical and substantial vaginal odor with clear discharge.  Denies pelvic or lower abdominal pain.  Denies concern for STI or pregnancy.    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   Panic disorder 12/15/2014    Allergies:  Allergies  Allergen Reactions   Amoxicillin Anaphylaxis   Penicillin G    Penicillins Anaphylaxis and Swelling    Has patient had a PCN reaction causing immediate rash, facial/tongue/throat swelling, SOB or lightheadedness with hypotension: Yes Has patient had a PCN reaction causing severe rash involving mucus membranes or skin necrosis: No Has patient had a PCN reaction that required hospitalization No Has patient had a  PCN reaction occurring within the last 10 years: Yes If all of the above answers are NO, then [place] proceed with Cephalosporin use.   Medications:  Current Outpatient Medications:    metroNIDAZOLE  (METROGEL ) 0.75 % vaginal gel, Place 1 Applicatorful vaginally at bedtime for 5 days., Disp: 50 g, Rfl: 0   ondansetron  (ZOFRAN -ODT) 4 MG disintegrating tablet, Take 1 tablet (4 mg total) by mouth every 8 (eight) hours as needed for nausea or vomiting., Disp: 20 tablet, Rfl: 0   acetaminophen  (TYLENOL ) 500 MG tablet, Take 1,000 mg by mouth every 6 (six) hours as needed (pain.)., Disp: , Rfl:    hydrOXYzine  (ATARAX ) 10 MG tablet, Take 1 tablet (10 mg total) by mouth 3 (three) times daily as needed., Disp: 30 tablet, Rfl: 0   ibuprofen  (ADVIL ) 200 MG tablet, Take 800 mg by mouth every 8 (eight) hours as needed (pain.)., Disp: , Rfl:   Observations/Objective: Patient is well-developed, well-nourished in no acute distress.  Resting comfortably at home.  Head is normocephalic, atraumatic.  No labored breathing.  Speech is clear and coherent with logical content.  Patient is alert and oriented at baseline.    Assessment and Plan: 1. Right upper quadrant pain (Primary) - ondansetron  (ZOFRAN -ODT) 4 MG disintegrating tablet; Take 1 tablet (4 mg total) by mouth every 8 (eight) hours as needed for nausea or vomiting.  Dispense: 20 tablet; Refill: 0 2. Constipation, unspecified constipation type - ondansetron  (ZOFRAN -ODT) 4 MG disintegrating tablet; Take 1 tablet (4 mg total) by mouth every 8 (eight) hours as needed for nausea or vomiting.  Dispense: 20 tablet; Refill: 0  Intermittent right upper quadrant pain associated with constipation and mild nausea without emesis.  Afebrile.  No change to urinary habits or noted hematuria to raise concern for potential stone.  She is post cholecystectomy.  Suspect that the constipation itself Venturini be causing the pain she is experiencing.  Supportive measures and OTC  medications reviewed.  Bowel regimen reviewed.  Zofran  per orders.  Strict in person follow-up precautions reviewed.  3. Bacterial vaginosis - metroNIDAZOLE  (METROGEL ) 0.75 % vaginal gel; Place 1 Applicatorful vaginally at bedtime for 5 days.  Dispense: 50 g; Refill: 0  Concern for onset of BV.  Will start MetroGel  for 5 nights.  Follow-up in person for any nonresolving, new or worsening symptoms despite treatment.  Follow Up Instructions: I discussed the assessment and treatment plan with the patient. The patient was provided an opportunity to ask questions and all were answered. The patient agreed with the plan and demonstrated an understanding of the instructions.  A copy of instructions were sent to the patient via MyChart unless otherwise noted below.     The patient was advised to call back or seek an in-person evaluation if the symptoms worsen or if the condition fails to improve as anticipated.    Mackenzie Velma Lunger, PA-C

## 2024-05-12 NOTE — Patient Instructions (Signed)
  Mollie M Dowis, thank you for joining [name], PA-C for today's virtual visit.  While this provider is not your primary care provider (PCP), if your PCP is located in our provider database this encounter information will be shared with them immediately following your visit.   A [facility] MyChart account gives you access to today's visit and all your visits, tests, and labs performed at [facility]  click here if you don't have a [facility] MyChart account or go to [email or web address]  Consent: (Patient) [name] provided verbal consent for this virtual visit at the beginning of the encounter.  Current Medications:  Current Outpatient Medications:    acetaminophen  (TYLENOL ) 500 MG tablet, Take 1,000 mg by mouth every 6 (six) hours as needed (pain.)., Disp: , Rfl:    colestipol  (COLESTID ) 1 g tablet, Take 1 tablet (1 g total) by mouth 2 (two) times daily., Disp: 60 tablet, Rfl: 11   hydrOXYzine  (ATARAX ) 10 MG tablet, Take 1 tablet (10 mg total) by mouth 3 (three) times daily as needed., Disp: 30 tablet, Rfl: 0   ibuprofen  (ADVIL ) 200 MG tablet, Take 800 mg by mouth every 8 (eight) hours as needed (pain.)., Disp: , Rfl:    ondansetron  (ZOFRAN -ODT) 4 MG disintegrating tablet, Take 1 tablet (4 mg total) by mouth every 8 (eight) hours as needed., Disp: 20 tablet, Rfl: 0   pantoprazole  (PROTONIX ) 20 MG tablet, Take 1 tablet (20 mg total) by mouth daily., Disp: 90 tablet, Rfl: 3   Medications ordered in this encounter:  No orders of the defined types were placed in this encounter.    *If you need refills on other medications prior to your next appointment, please contact your pharmacy*  Follow-Up: Call back or seek an in-person evaluation if the symptoms worsen or if the condition fails to improve as anticipated.  [facility] [phone]  Other Instructions Please hydrate and rest. Use the Zofran  as directed for nausea. Follow the bowel  regimen below. If you note any non-resolving, new, or worsening symptoms despite treatment, please seek an in-person evaluation ASAP.   I encourage you to increase hydration and the amount of fiber in your diet.  Start a daily probiotic (Align, Culturelle, Digestive Advantage, etc.). Take 2 Tbs of Milk of Magnesia in a 4 oz glass of warmed prune juice every 2-3 days to help promote bowel movement.    For the BV, use the MetroGel as directed. Follow-up in person with your PCP for any nonresolving symptoms.    If you have been instructed to have an in-person evaluation today at a local Urgent Care facility, please use the link below. It will take you to a list of all of our available [facility], including address, phone number and hours of operation. Please do not delay care.  [facility]  If you or a family member do not have a primary care provider, use the link below to schedule a visit and establish care. When you choose a [facility] primary care physician or advanced practice provider, you gain a long-term partner in health. Find a Primary Care Provider  Learn more about [name] in-office and virtual care options: [facility] - Get Care Now

## 2024-05-23 ENCOUNTER — Emergency Department

## 2024-05-23 ENCOUNTER — Other Ambulatory Visit: Payer: Self-pay

## 2024-05-23 ENCOUNTER — Ambulatory Visit: Admitting: Family Medicine

## 2024-05-23 ENCOUNTER — Ambulatory Visit: Payer: Self-pay

## 2024-05-23 ENCOUNTER — Emergency Department: Admission: EM | Admit: 2024-05-23 | Discharge: 2024-05-23 | Disposition: A | Discharge status: Home / Self Care

## 2024-05-23 DIAGNOSIS — K5900 Constipation, unspecified: Secondary | ICD-10-CM

## 2024-05-23 LAB — LIPASE, BLOOD: Lipase: 45 U/L (ref 11–51)

## 2024-05-23 LAB — CBC
HCT: 42.8 % (ref 36.0–46.0)
Hemoglobin: 13.5 g/dL (ref 12.0–15.0)
MCH: 27.3 pg (ref 26.0–34.0)
MCHC: 31.5 g/dL (ref 30.0–36.0)
MCV: 86.5 fL (ref 80.0–100.0)
Platelets: 204 K/uL (ref 150–400)
RBC: 4.95 MIL/uL (ref 3.87–5.11)
RDW: 12.8 % (ref 11.5–15.5)
WBC: 9.4 K/uL (ref 4.0–10.5)
nRBC: 0 % (ref 0.0–0.2)

## 2024-05-23 LAB — URINALYSIS, ROUTINE W REFLEX MICROSCOPIC
Bilirubin Urine: NEGATIVE
Glucose, UA: NEGATIVE mg/dL
Hgb urine dipstick: NEGATIVE
Ketones, ur: NEGATIVE mg/dL
Leukocytes,Ua: NEGATIVE
Nitrite: NEGATIVE
Protein, ur: NEGATIVE mg/dL
Specific Gravity, Urine: 1.013 (ref 1.005–1.030)
pH: 6 (ref 5.0–8.0)

## 2024-05-23 LAB — COMPREHENSIVE METABOLIC PANEL WITH GFR
ALT: 10 U/L (ref 0–44)
AST: 17 U/L (ref 15–41)
Albumin: 4.3 g/dL (ref 3.5–5.0)
Alkaline Phosphatase: 75 U/L (ref 38–126)
Anion gap: 10 (ref 5–15)
BUN: 11 mg/dL (ref 6–20)
CO2: 23 mmol/L (ref 22–32)
Calcium: 9.1 mg/dL (ref 8.9–10.3)
Chloride: 106 mmol/L (ref 98–111)
Creatinine, Ser: 0.84 mg/dL (ref 0.44–1.00)
GFR, Estimated: >60 mL/min (ref >60)
Glucose, Bld: 117 mg/dL — ABNORMAL HIGH (ref 70–99)
Potassium: 3.9 mmol/L (ref 3.5–5.1)
Sodium: 139 mmol/L (ref 135–145)
Total Bilirubin: 0.3 mg/dL (ref 0.0–1.2)
Total Protein: 6.9 g/dL (ref 6.5–8.1)

## 2024-05-23 LAB — PREGNANCY, URINE: Preg Test, Ur: NEGATIVE

## 2024-05-23 MED ORDER — DOCUSATE SODIUM 100 MG PO CAPS: 100.0000 mg | ORAL_CAPSULE | Freq: Two times a day (BID) | ORAL | 0 refills | Status: AC

## 2024-05-23 NOTE — Telephone Encounter (Signed)
 FYI Only or Action Required?: FYI only for provider: advised to be seen in the next 4 hours.  Patient was last seen in primary care on 05/12/2024 by [name], PA-C.  Called Nurse Triage reporting Constipation.  Symptoms began several days ago.  Interventions attempted: OTC medications: suppository.  Symptoms are: unchanged.  Triage Disposition: See HCP Within 4 Hours (Or PCP Triage)  Patient/caregiver understands and will follow disposition?: Yes, will follow disposition  Reason for Triage: has been constipated for at least a week, can only pass very small amounts, like a couple droplets at a time- [phone]   Reason for Disposition  [1] Rectal pain or fullness from fecal impaction (rectum full of stool) AND [2] NOT better after SITZ bath, suppository or enema  Answer Assessment - Initial Assessment Questions 1. STOOL PATTERN OR FREQUENCY: How often do you have a bowel movement (BM)?  (Normal range: 3 times a day to every 3 days)  When was your last BM?       Depends, pt states she does not have a GB and it impacts it 2. STRAINING: Do you have to strain to have a BM?      yes 3. ONSET: When did the constipation begin?     Pt states she was on abx for BV about a week and has not had a full BM since prior to medication 4. RECTAL PAIN: Does your rectum hurt when the stool comes out? If Yes, ask: Do you have hemorrhoids? How bad is the pain?  (Scale 1-10; or mild, moderate, severe)     Hx of hemorrhoids 5. BM COMPOSITION: Are the stools hard?      pebbles 6. BLOOD ON STOOLS: Has there been any blood on the toilet tissue or on the surface of the BM? If Yes, ask: When was the last time?     denies 7. CHRONIC CONSTIPATION: Is this a new problem for you?  If No, ask: How long have you had this problem? (days, weeks, months)      States she has only had constipation with prior surgeries 8. CHANGES IN DIET OR HYDRATION: Have there been any recent changes in  your diet? How much fluids are you drinking on a daily basis?  How much have you had to drink today?     denies 9. MEDICINES: Have you been taking any new medicines? Are you taking any narcotic pain medicines? (e.g., Dilaudid , morphine , Percocet, Vicodin)     denies 10. LAXATIVES: Have you been using any stool softeners, laxatives, or enemas?  If Yes, ask What are you using, how often, and when was the last time?       Suppository x2 11. ACTIVITY:  How much walking do you do every day?  Has your activity level decreased in the past week?        denies 12. CAUSE: What do you think is causing the constipation?        Unsure,  13. MEDICAL HISTORY: Do you have a history of hemorrhoids, rectal fissures, rectal surgery, or rectal abscess?         hemorrhoids 14. OTHER SYMPTOMS: Do you have any other symptoms? (e.g., abdomen pain, bloating, fever, vomiting)       States that she is still passing gas 15. PREGNANCY: Is there any chance you are pregnant? When was your last menstrual period?       Denies  Pt states that she has done a warm bath and 2 suppositories. Pt  states that the most she was able to produce was pebbles. Pt states that she has lower abd pain and feels like she has to have a BM but nothing comes out. Pt with hx of POTS. Pt advised to be seen in the next 4 hours, pt understands.  Protocols used: Constipation-A-AH

## 2024-05-23 NOTE — ED Provider Notes (Signed)
 [facility] Provider Note    Event Date/Time   First MD Initiated Contact with Patient 05/23/24 1705     (approximate)   History   Constipation   HPI  [name] is a 28 y.o. female who presents with concern of constipation.  Recently completed course of clotrimazole it seems for BV.  Since then symptoms were improving but she feels that she would become constipated unable to have a bowel movement.  She has tried suppositories which allow her to have a very small bowel movement, she is also tried digital disimpaction with limited success.  She states that she sits on the toilet and strains but unfortunately not successful in having a bowel movement.  She has been able to tolerate p.o. denies any abdominal pain.  She has otherwise been doing well, has been able to tolerate p.o. no fevers chills.  She called her primary doctor, given her symptoms of constipation she consider possibly using MiraLAX  however her primary doctor encouraged her to come into the emergency department for evaluation.     Physical Exam   Triage Vital Signs: ED Triage Vitals  Encounter Vitals Group     BP 05/23/24 1622 (!) 139/98     Girls Systolic BP Percentile --      Girls Diastolic BP Percentile --      Boys Systolic BP Percentile --      Boys Diastolic BP Percentile --      Pulse Rate 05/23/24 1622 91     Resp 05/23/24 1622 18     Temp 05/23/24 1620 (P) 98.1 F (36.7 C)     Temp Source 05/23/24 1620 (P) Oral     SpO2 05/23/24 1622 98 %     Weight --      Height --      Head Circumference --      Peak Flow --      Pain Score 05/23/24 1623 8     Pain Loc --      Pain Education --      Exclude from Growth Chart --     Most recent vital signs: Vitals:   05/23/24 1622 05/23/24 1810  BP: (!) 139/98 139/87  Pulse: 91 88  Resp: 18 18  Temp: 98.1 F (36.7 C)   SpO2: 98% 99%     General: Awake, no distress.  CV:  Good peripheral perfusion.  Resp:  Normal effort.   Abd:  No distention.  Soft with some minor discomfort to palpation along the right upper quadrant of the abdomen. Other:     ED Results / Procedures / Treatments   Labs (all labs ordered are listed, but only abnormal results are displayed) Labs Reviewed  COMPREHENSIVE METABOLIC PANEL WITH GFR - Abnormal; Notable for the following components:      Result Value   Glucose, Bld 117 (*)    All other components within normal limits  URINALYSIS, ROUTINE W REFLEX MICROSCOPIC - Abnormal; Notable for the following components:   Color, Urine YELLOW (*)    APPearance CLEAR (*)    All other components within normal limits  LIPASE, BLOOD  CBC  PREGNANCY, URINE     EKG     RADIOLOGY  On my independent interpretation, I do not appreciate significant stool burden in the rectal vault, generalized moderate stool burden  PROCEDURES:  Critical Care performed: No  Procedures   MEDICATIONS ORDERED IN ED: Medications - No data to display   IMPRESSION /  MDM / ASSESSMENT AND PLAN / ED COURSE  I reviewed the triage vital signs and the nursing notes.                               Patient's presentation is most consistent with acute, uncomplicated illness.  28 year old female who presents with concern of constipation.  Is complaining of some right-sided flank pain.  She appears clinically well she is not in any acute distress.  Blood work was ordered prior to my own assessment which is reassuring.  Given her right-sided flank pain, adding on a urinalysis to assess for possible UTI but clinically feel this is very unlikely.  She is status post cholecystectomy as well as partial hysterectomy.  Will follow-up urinalysis, imaging demonstrates some generalized stool burden but no evidence of significant fecal impaction.  Discussed with the patient, I offered an enema but explained that I find little utility given the presentation, and feel more likely would benefit from Colace or MiraLAX  course at  home.  Patient verbalizes understanding and agrees with this assessment and will be comfortable with discharge.  Will follow-up urinalysis and anticipate likely discharge home.   Clinical Course as of 05/23/24 2148  Mon May 23, 2024  1757 Patient urinalysis is reassuring, will have her discharged home at this time [SK]    Clinical Course User Index [SK] Fernand Rossie HERO, MD     FINAL CLINICAL IMPRESSION(S) / ED DIAGNOSES   Final diagnoses:  Constipation, unspecified constipation type     Rx / DC Orders   ED Discharge Orders          Ordered    docusate sodium  (COLACE) 100 MG capsule  2 times daily        05/23/24 1758             Note:  This document was prepared using Dragon voice recognition software and Ric[employer] include unintentional dictation errors.   Fernand Rossie HERO, MD 05/23/24 [id number]

## 2024-05-23 NOTE — ED Triage Notes (Signed)
 Pt to ED via POV from home. Pt states constipation x1 wk. Pt reports has been doing at home remedies with very small passage. Pt reports abd bloating and pain. Pt reports recent antibiotic.

## 2024-05-23 NOTE — Discharge Instructions (Signed)
 You were seen today due to concern of constipation.  At this time your labs are reassuring, urinalysis is also reassuring.  Your CT scan does show a degree of constipation, I have written for a laxative for you to take, please take this as instructed.  If you have any worsening of symptoms such as increased abdominal pain, nausea, vomiting, or any other symptoms you find concerning please return to the emergency department immediately for further medical management.
